# Patient Record
Sex: Male | Born: 1965 | Race: White | Hispanic: No | Marital: Married | Smoking: Never smoker
Health system: Southern US, Community
[De-identification: ages and names within clinical notes are randomized; demographics above are authoritative.]

## PROBLEM LIST (undated history)

## (undated) DIAGNOSIS — I10 Essential (primary) hypertension: Secondary | ICD-10-CM

## (undated) DIAGNOSIS — E119 Type 2 diabetes mellitus without complications: Secondary | ICD-10-CM

## (undated) DIAGNOSIS — E785 Hyperlipidemia, unspecified: Secondary | ICD-10-CM

## (undated) HISTORY — DX: Type 2 diabetes mellitus without complications: E11.9

## (undated) HISTORY — DX: Essential (primary) hypertension: I10

## (undated) HISTORY — PX: PLEURAL SCARIFICATION: SHX748

## (undated) HISTORY — DX: Hyperlipidemia, unspecified: E78.5

---

## 2009-01-15 ENCOUNTER — Ambulatory Visit: Payer: Self-pay | Admitting: Family Medicine

## 2011-05-20 ENCOUNTER — Ambulatory Visit: Payer: Self-pay | Admitting: Emergency Medicine

## 2014-09-02 DIAGNOSIS — E1159 Type 2 diabetes mellitus with other circulatory complications: Secondary | ICD-10-CM | POA: Insufficient documentation

## 2014-09-02 DIAGNOSIS — I10 Essential (primary) hypertension: Secondary | ICD-10-CM | POA: Insufficient documentation

## 2014-09-02 DIAGNOSIS — E785 Hyperlipidemia, unspecified: Secondary | ICD-10-CM | POA: Insufficient documentation

## 2014-09-02 DIAGNOSIS — E669 Obesity, unspecified: Secondary | ICD-10-CM | POA: Insufficient documentation

## 2014-09-02 DIAGNOSIS — E1169 Type 2 diabetes mellitus with other specified complication: Secondary | ICD-10-CM | POA: Insufficient documentation

## 2014-09-02 DIAGNOSIS — I152 Hypertension secondary to endocrine disorders: Secondary | ICD-10-CM | POA: Insufficient documentation

## 2014-09-06 ENCOUNTER — Ambulatory Visit (INDEPENDENT_AMBULATORY_CARE_PROVIDER_SITE_OTHER): Payer: BLUE CROSS/BLUE SHIELD | Admitting: Family Medicine

## 2014-09-06 ENCOUNTER — Encounter: Payer: Self-pay | Admitting: Family Medicine

## 2014-09-06 VITALS — BP 124/81 | HR 69 | Temp 98.3°F | Ht 68.5 in | Wt 227.0 lb

## 2014-09-06 DIAGNOSIS — Z0000 Encounter for general adult medical examination without abnormal findings: Secondary | ICD-10-CM | POA: Diagnosis not present

## 2014-09-06 DIAGNOSIS — Z1211 Encounter for screening for malignant neoplasm of colon: Secondary | ICD-10-CM | POA: Diagnosis not present

## 2014-09-06 DIAGNOSIS — E785 Hyperlipidemia, unspecified: Secondary | ICD-10-CM | POA: Diagnosis not present

## 2014-09-06 DIAGNOSIS — I10 Essential (primary) hypertension: Secondary | ICD-10-CM | POA: Diagnosis not present

## 2014-09-06 DIAGNOSIS — E119 Type 2 diabetes mellitus without complications: Secondary | ICD-10-CM | POA: Diagnosis not present

## 2014-09-06 LAB — MICROALBUMIN, URINE WAIVED
Creatinine, Urine Waived: 100 mg/dL (ref 10–300)
Microalb, Ur Waived: 10 mg/L (ref 0–19)

## 2014-09-06 LAB — URINALYSIS, ROUTINE W REFLEX MICROSCOPIC
Bilirubin, UA: NEGATIVE
Ketones, UA: NEGATIVE
LEUKOCYTES UA: NEGATIVE
NITRITE UA: NEGATIVE
PH UA: 5.5 (ref 5.0–7.5)
Protein, UA: NEGATIVE
RBC, UA: NEGATIVE
Specific Gravity, UA: 1.015 (ref 1.005–1.030)
Urobilinogen, Ur: 0.2 mg/dL (ref 0.2–1.0)

## 2014-09-06 LAB — BAYER DCA HB A1C WAIVED: HB A1C: 7.5 % — AB (ref <7.0)

## 2014-09-06 MED ORDER — SITAGLIPTIN PHOSPHATE 100 MG PO TABS: 100.0000 mg | ORAL_TABLET | Freq: Every day | ORAL | Status: DC

## 2014-09-06 MED ORDER — BENAZEPRIL HCL 40 MG PO TABS: 40.0000 mg | ORAL_TABLET | Freq: Every day | ORAL | Status: DC

## 2014-09-06 MED ORDER — EXENATIDE ER 2 MG ~~LOC~~ PEN: 2.0000 mg | PEN_INJECTOR | SUBCUTANEOUS | Status: DC

## 2014-09-06 MED ORDER — SIMVASTATIN 40 MG PO TABS: 40.0000 mg | ORAL_TABLET | Freq: Every day | ORAL | Status: DC

## 2014-09-06 MED ORDER — DAPAGLIFLOZIN PRO-METFORMIN ER 5-1000 MG PO TB24: 5.0000 | ORAL_TABLET | Freq: Two times a day (BID) | ORAL | Status: DC

## 2014-09-06 NOTE — Assessment & Plan Note (Signed)
The current medical regimen is effective;  continue present plan and medications.  

## 2014-09-06 NOTE — Progress Notes (Signed)
pp  BP 124/81 mmHg  Pulse 69  Temp(Src) 98.3 F (36.8 C)  Ht 5' 8.5" (1.74 m)  Wt 227 lb (102.967 kg)  BMI 34.01 kg/m2  SpO2 97%   Subjective:    Patient ID: Matthew Collier, male    DOB: Oct 18, 1965, 49 y.o.   MRN: [id number]  HPI: Matthew Collier is a 49 y.o. male  Chief Complaint  Patient presents with  . Annual Exam  . Diabetes   Blood pressure doing well with medications. No side effects takes faithfully blood sugar doing well having some insurance hassles and has been out of xigduo Simvastatin doing well with cholesterol no complaints or problems no myalgias  Relevant past medical, surgical, family and social history reviewed and updated as indicated. Interim medical history since our last visit reviewed. Allergies and medications reviewed and updated.  Review of Systems  Constitutional: Negative.   HENT: Negative.   Eyes: Negative.   Respiratory: Negative.   Cardiovascular: Negative.   Endocrine: Negative.   Musculoskeletal: Negative.   Skin: Negative.   Allergic/Immunologic: Negative.   Neurological: Negative.   Hematological: Negative.   Psychiatric/Behavioral: Negative.     Per HPI unless specifically indicated above     Objective:    BP 124/81 mmHg  Pulse 69  Temp(Src) 98.3 F (36.8 C)  Ht 5' 8.5" (1.74 m)  Wt 227 lb (102.967 kg)  BMI 34.01 kg/m2  SpO2 97%  Wt Readings from Last 3 Encounters:  09/06/14 227 lb (102.967 kg)  06/07/14 235 lb (106.595 kg)    Physical Exam  Constitutional: He is oriented to person, place, and time. He appears well-developed and well-nourished.  HENT:  Head: Normocephalic and atraumatic.  Right Ear: External ear normal.  Left Ear: External ear normal.  Eyes: Conjunctivae and EOM are normal. Pupils are equal, round, and reactive to light.  Neck: Normal range of motion. Neck supple.  Cardiovascular: Normal rate, regular rhythm, normal heart sounds and intact distal pulses.   Pulmonary/Chest: Effort normal and breath  sounds normal.  Abdominal: Soft. Bowel sounds are normal. There is no splenomegaly or hepatomegaly.  Genitourinary: Rectum normal, prostate normal and penis normal.  Musculoskeletal: Normal range of motion.  Neurological: He is alert and oriented to person, place, and time. He has normal reflexes.  Skin: No rash noted. No erythema.  Psychiatric: He has a normal mood and affect. His behavior is normal. Judgment and thought content normal.    No results found for this or any previous visit.    Assessment & Plan:   Problem List Items Addressed This Visit      Cardiovascular and Mediastinum   Hypertension    The current medical regimen is effective;  continue present plan and medications.       Relevant Medications   simvastatin (ZOCOR) 40 MG tablet   benazepril (LOTENSIN) 40 MG tablet     Endocrine   Diabetes mellitus without complication - Primary    A1C elevated but has been out of meds Discuss weight loss for better control of diabetes exercise Go to another pharmacy to use coupon Restart all medications       Relevant Medications   sitaGLIPtin (JANUVIA) 100 MG tablet   simvastatin (ZOCOR) 40 MG tablet   Exenatide ER (BYDUREON) 2 MG PEN   Dapagliflozin-Metformin HCl ER (XIGDUO XR) 05-998 MG TB24   benazepril (LOTENSIN) 40 MG tablet   Other Relevant Orders   Microalbumin, Urine Waived   Bayer DCA Hb A1c Waived  Other   Hyperlipidemia    The current medical regimen is effective;  continue present plan and medications.       Relevant Medications   simvastatin (ZOCOR) 40 MG tablet   benazepril (LOTENSIN) 40 MG tablet    Other Visit Diagnoses    Routine general medical examination at a health care facility        Relevant Orders    CBC with Differential/Platelet    Comprehensive metabolic panel    Lipid Panel w/o Chol/HDL Ratio    PSA    TSH    Urinalysis, Routine w reflex microscopic (not at [facility])    Colon cancer screening        Relevant Orders     Ambulatory referral to General Surgery        Follow up plan: Return in about 3 months (around 12/07/2014), or if symptoms worsen or fail to improve, for med check.

## 2014-09-06 NOTE — Assessment & Plan Note (Signed)
A1C elevated but has been out of meds Discuss weight loss for better control of diabetes exercise Go to another pharmacy to use coupon Restart all medications

## 2014-09-07 ENCOUNTER — Encounter: Payer: Self-pay | Admitting: Family Medicine

## 2014-09-07 LAB — CBC WITH DIFFERENTIAL/PLATELET
BASOS: 1 %
Basophils Absolute: 0.1 10*3/uL (ref 0.0–0.2)
EOS (ABSOLUTE): 0.1 10*3/uL (ref 0.0–0.4)
EOS: 2 %
HEMATOCRIT: 48.9 % (ref 37.5–51.0)
HEMOGLOBIN: 17.1 g/dL (ref 12.6–17.7)
IMMATURE GRANS (ABS): 0.1 10*3/uL (ref 0.0–0.1)
IMMATURE GRANULOCYTES: 1 %
Lymphocytes Absolute: 1.5 10*3/uL (ref 0.7–3.1)
Lymphs: 21 %
MCH: 32.3 pg (ref 26.6–33.0)
MCHC: 35 g/dL (ref 31.5–35.7)
MCV: 92 fL (ref 79–97)
MONOCYTES: 9 %
MONOS ABS: 0.7 10*3/uL (ref 0.1–0.9)
NEUTROS PCT: 66 %
Neutrophils Absolute: 4.8 10*3/uL (ref 1.4–7.0)
Platelets: 215 10*3/uL (ref 150–379)
RBC: 5.29 x10E6/uL (ref 4.14–5.80)
RDW: 13.3 % (ref 12.3–15.4)
WBC: 7.1 10*3/uL (ref 3.4–10.8)

## 2014-09-07 LAB — TSH: TSH: 0.61 u[IU]/mL (ref 0.450–4.500)

## 2014-09-07 LAB — COMPREHENSIVE METABOLIC PANEL
A/G RATIO: 1.7 (ref 1.1–2.5)
ALBUMIN: 4.3 g/dL (ref 3.5–5.5)
ALT: 37 IU/L (ref 0–44)
AST: 22 IU/L (ref 0–40)
Alkaline Phosphatase: 63 IU/L (ref 39–117)
BUN/Creatinine Ratio: 14 (ref 9–20)
BUN: 11 mg/dL (ref 6–24)
Bilirubin Total: 0.5 mg/dL (ref 0.0–1.2)
CALCIUM: 9.2 mg/dL (ref 8.7–10.2)
CO2: 21 mmol/L (ref 18–29)
CREATININE: 0.77 mg/dL (ref 0.76–1.27)
Chloride: 101 mmol/L (ref 97–108)
GFR, EST AFRICAN AMERICAN: 121 mL/min/{1.73_m2} (ref >59)
GFR, EST NON AFRICAN AMERICAN: 105 mL/min/{1.73_m2} (ref >59)
GLOBULIN, TOTAL: 2.5 g/dL (ref 1.5–4.5)
Glucose: 168 mg/dL — ABNORMAL HIGH (ref 65–99)
POTASSIUM: 4.7 mmol/L (ref 3.5–5.2)
SODIUM: 141 mmol/L (ref 134–144)
TOTAL PROTEIN: 6.8 g/dL (ref 6.0–8.5)

## 2014-09-07 LAB — LIPID PANEL W/O CHOL/HDL RATIO
Cholesterol, Total: 111 mg/dL (ref 100–199)
HDL: 32 mg/dL — ABNORMAL LOW (ref >39)
LDL CALC: 41 mg/dL (ref 0–99)
Triglycerides: 191 mg/dL — ABNORMAL HIGH (ref 0–149)
VLDL Cholesterol Cal: 38 mg/dL (ref 5–40)

## 2014-09-07 LAB — PSA: PROSTATE SPECIFIC AG, SERUM: 0.8 ng/mL (ref 0.0–4.0)

## 2014-10-12 ENCOUNTER — Telehealth: Payer: Self-pay

## 2014-10-12 ENCOUNTER — Other Ambulatory Visit: Payer: Self-pay | Admitting: Family Medicine

## 2014-10-12 DIAGNOSIS — E119 Type 2 diabetes mellitus without complications: Secondary | ICD-10-CM

## 2014-10-12 MED ORDER — DAPAGLIFLOZIN PRO-METFORMIN ER 5-1000 MG PO TB24: 5.0000 | ORAL_TABLET | Freq: Two times a day (BID) | ORAL | Status: DC

## 2014-10-12 NOTE — Telephone Encounter (Signed)
Brooke from [facility] called in regards to pt's RX for Erlene Quan stated this is usually dosed as one per day, if the prescribed BID is correct can they do 60 tablets to last the entire month. Please call Brooke at [facility] ASAP. Thanks.

## 2014-12-12 ENCOUNTER — Encounter: Payer: Self-pay | Admitting: Family Medicine

## 2014-12-12 ENCOUNTER — Ambulatory Visit (INDEPENDENT_AMBULATORY_CARE_PROVIDER_SITE_OTHER): Payer: BLUE CROSS/BLUE SHIELD | Admitting: Family Medicine

## 2014-12-12 VITALS — BP 127/83 | HR 91 | Temp 98.1°F | Ht 68.5 in | Wt 235.0 lb

## 2014-12-12 DIAGNOSIS — I10 Essential (primary) hypertension: Secondary | ICD-10-CM

## 2014-12-12 DIAGNOSIS — E119 Type 2 diabetes mellitus without complications: Secondary | ICD-10-CM | POA: Diagnosis not present

## 2014-12-12 DIAGNOSIS — E785 Hyperlipidemia, unspecified: Secondary | ICD-10-CM

## 2014-12-12 LAB — BAYER DCA HB A1C WAIVED: HB A1C (BAYER DCA - WAIVED): 7 % — ABNORMAL HIGH (ref <7.0)

## 2014-12-12 NOTE — Assessment & Plan Note (Signed)
The current medical regimen is effective;  continue present plan and medications.  

## 2014-12-12 NOTE — Progress Notes (Signed)
   BP 127/83 mmHg  Pulse 91  Temp(Src) 98.1 F (36.7 C)  Ht 5' 8.5" (1.74 m)  Wt 235 lb (106.595 kg)  BMI 35.21 kg/m2  SpO2 97%   Subjective:    Patient ID: Matthew Collier, male    DOB: [birth year], 49 y.o.   MRN: [id number]  HPI: Matthew Collier is a 49 y.o. male  Chief Complaint  Patient presents with  . Diabetes   Patient all in all doing very well noted low blood sugar spells no complaints from medications Price on medications working as it's supposed to Has not lost weight but it's Christmas season Blood pressure doing well no complaints from medications Cholesterol doing well no complaints from medications Relevant past medical, surgical, family and social history reviewed and updated as indicated. Interim medical history since our last visit reviewed. Allergies and medications reviewed and updated.  Review of Systems  Constitutional: Negative.   Respiratory: Negative.   Cardiovascular: Negative.     Per HPI unless specifically indicated above     Objective:    BP 127/83 mmHg  Pulse 91  Temp(Src) 98.1 F (36.7 C)  Ht 5' 8.5" (1.74 m)  Wt 235 lb (106.595 kg)  BMI 35.21 kg/m2  SpO2 97%  Wt Readings from Last 3 Encounters:  12/12/14 235 lb (106.595 kg)  09/06/14 227 lb (102.967 kg)  06/07/14 235 lb (106.595 kg)    Physical Exam  Constitutional: He is oriented to person, place, and time. He appears well-developed and well-nourished. No distress.  HENT:  Head: Normocephalic and atraumatic.  Right Ear: Hearing normal.  Left Ear: Hearing normal.  Nose: Nose normal.  Eyes: Conjunctivae and lids are normal. Right eye exhibits no discharge. Left eye exhibits no discharge. No scleral icterus.  Cardiovascular: Normal rate, regular rhythm and normal heart sounds.   Pulmonary/Chest: Effort normal and breath sounds normal. No respiratory distress.  Musculoskeletal: Normal range of motion.  Neurological: He is alert and oriented to person, place, and time.  Skin: Skin is  intact. No rash noted.  Psychiatric: He has a normal mood and affect. His speech is normal and behavior is normal. Judgment and thought content normal. Cognition and memory are normal.    Results for orders placed or performed in visit on 12/12/14  Bayer DCA Hb A1c Waived  Result Value Ref Range   Bayer DCA Hb A1c Waived 7.0 (H) <7.0 %      Assessment & Plan:   Problem List Items Addressed This Visit      Cardiovascular and Mediastinum   Hypertension - Primary    The current medical regimen is effective;  continue present plan and medications.         Endocrine   Diabetes mellitus without complication ([place])    The current medical regimen is effective;  continue present plan and medications.         Other   Hyperlipidemia    The current medical regimen is effective;  continue present plan and medications.           Follow up plan: Return in about 3 months (around 03/12/2015) for Med check with labs of BMP, lipid, ALT, AST, A1c.

## 2015-01-23 ENCOUNTER — Ambulatory Visit: Payer: BLUE CROSS/BLUE SHIELD | Admitting: Unknown Physician Specialty

## 2015-02-02 ENCOUNTER — Encounter: Payer: Self-pay | Admitting: Unknown Physician Specialty

## 2015-02-07 ENCOUNTER — Encounter: Payer: Self-pay | Admitting: Emergency Medicine

## 2015-02-07 ENCOUNTER — Ambulatory Visit
Admission: EM | Admit: 2015-02-07 | Discharge: 2015-02-07 | Disposition: A | Discharge status: Home / Self Care | Payer: BLUE CROSS/BLUE SHIELD | Attending: Family Medicine | Admitting: Family Medicine

## 2015-02-07 DIAGNOSIS — J40 Bronchitis, not specified as acute or chronic: Secondary | ICD-10-CM | POA: Diagnosis not present

## 2015-02-07 MED ORDER — AZITHROMYCIN 250 MG PO TABS: ORAL_TABLET | ORAL | Status: DC

## 2015-02-07 MED ORDER — HYDROCOD POLST-CPM POLST ER 10-8 MG/5ML PO SUER: 5.0000 mL | Freq: Two times a day (BID) | ORAL | Status: DC

## 2015-02-07 MED ORDER — FLUTICASONE PROPIONATE 50 MCG/ACT NA SUSP: 2.0000 | Freq: Every day | NASAL | Status: DC

## 2015-02-07 NOTE — ED Provider Notes (Signed)
CSN: [id number]     Arrival date & time 02/07/15  1105 History   First MD Initiated Contact with Patient 02/07/15 1153     Chief Complaint  Patient presents with  . Cough  . Nasal Congestion   (Consider location/radiation/quality/duration/timing/severity/associated sxs/prior Treatment) HPI   50 year old male who presents with a month of coughing and cold symptoms with chest congestion runny nose and yesterday stated that his coughing worsened much more than usual with some green sputum production. He states that he had this since [place] and then seemed to improve for a while but only to return with more of a vengeance. He is febrile today at 100.2 and states he's had chills and fevers on and off throughout this time. O2 sat is 98%. He is a nonsmoker and never did smoke.  Past Medical History  Diagnosis Date  . Diabetes mellitus without complication ([place])   . Hyperlipidemia   . Hypertension    Past Surgical History  Procedure Laterality Date  . Pleural scarification     Family History  Problem Relation Age of Onset  . Hypertension Mother   . Diabetes Mother   . Hypertension Father   . Cancer Father    Social History  Substance Use Topics  . Smoking status: Never Smoker   . Smokeless tobacco: Never Used  . Alcohol Use: Yes     Comment: occasional    Review of Systems  Constitutional: Positive for fever, chills and fatigue.  HENT: Positive for congestion, postnasal drip, rhinorrhea, sinus pressure and sneezing.   Respiratory: Positive for cough. Negative for wheezing and stridor.   Musculoskeletal: Positive for myalgias.  All other systems reviewed and are negative.   Allergies  Penicillins  Home Medications   Prior to Admission medications   Medication Sig Start Date End Date Taking? Authorizing Provider  aspirin EC 81 MG tablet Take 81 mg by mouth daily.    Historical Provider, MD  azithromycin (ZITHROMAX Z-PAK) 250 MG tablet Use as per package instructions  02/07/15   Lorin Picket, PA-C  benazepril (LOTENSIN) 40 MG tablet Take 1 tablet (40 mg total) by mouth daily. 09/06/14   Guadalupe Maple, MD  chlorpheniramine-HYDROcodone (TUSSIONEX PENNKINETIC ER) 10-8 MG/5ML SUER Take 5 mLs by mouth 2 (two) times daily. 02/07/15   Lorin Picket, PA-C  Dapagliflozin-Metformin HCl ER (XIGDUO XR) 05-998 MG TB24 Take 5-1,000 tablets by mouth 2 (two) times daily. 10/12/14   Guadalupe Maple, MD  Exenatide ER (BYDUREON) 2 MG PEN Inject 2 mg into the skin once a week. 09/06/14   Guadalupe Maple, MD  fluticasone (FLONASE) 50 MCG/ACT nasal spray Place 2 sprays into both nostrils daily. 02/07/15   Lorin Picket, PA-C  simvastatin (ZOCOR) 40 MG tablet Take 1 tablet (40 mg total) by mouth daily. 09/06/14   Guadalupe Maple, MD  sitaGLIPtin (JANUVIA) 100 MG tablet Take 1 tablet (100 mg total) by mouth daily. 09/06/14   Guadalupe Maple, MD   Meds Ordered and Administered this Visit  Medications - No data to display  BP 159/92 mmHg  Pulse 98  Temp(Src) 100.2 F (37.9 C) (Oral)  Resp 17  Ht 5\' 10"  (1.778 m)  Wt 235 lb (106.595 kg)  BMI 33.72 kg/m2  SpO2 98% No data found.   Physical Exam  Constitutional: He is oriented to person, place, and time. He appears well-developed and well-nourished. No distress.  HENT:  Head: Normocephalic and atraumatic.  Right Ear: External ear  normal.  Left Ear: External ear normal.  Nose: Nose normal.  Mouth/Throat: Oropharynx is clear and moist. No oropharyngeal exudate.  Eyes: Conjunctivae are normal. Pupils are equal, round, and reactive to light.  Neck: Normal range of motion. Neck supple.  Pulmonary/Chest: Breath sounds normal. No respiratory distress. He has no wheezes. He has no rales.  Musculoskeletal: Normal range of motion. He exhibits no edema or tenderness.  Lymphadenopathy:    He has no cervical adenopathy.  Neurological: He is alert and oriented to person, place, and time.  Skin: Skin is warm and dry. He is not  diaphoretic.  Psychiatric: He has a normal mood and affect. His behavior is normal. Judgment and thought content normal.  Nursing note and vitals reviewed.   ED Course  Procedures (including critical care time)  Labs Review Labs Reviewed - No data to display  Imaging Review No results found.   Visual Acuity Review  Right Eye Distance:   Left Eye Distance:   Bilateral Distance:    Right Eye Near:   Left Eye Near:    Bilateral Near:         MDM   1. Bronchitis    Discharge Medication List as of 02/07/2015 12:08 PM    START taking these medications   Details  azithromycin (ZITHROMAX Z-PAK) 250 MG tablet Use as per package instructions, Normal    chlorpheniramine-HYDROcodone (TUSSIONEX PENNKINETIC ER) 10-8 MG/5ML SUER Take 5 mLs by mouth 2 (two) times daily., Starting 02/07/2015, Until Discontinued, Print    fluticasone (FLONASE) 50 MCG/ACT nasal spray Place 2 sprays into both nostrils daily., Starting 02/07/2015, Until Discontinued, Normal      Plan: 1. Diagnosis reviewed with patient 2. rx as per orders; risks, benefits, potential side effects reviewed with patient 3. Recommend supportive treatment with fluids and rest. Sees had this symptoms for over a month they have been no recurrent I will start him on a short course of antibiotics divide antitussives recommended the use of Flonase on a daily basis for a month. He still is not improving I recommended that he seen by his primary care physician for ongoing care. 4. F/u prn if symptoms worsen or don't improve      Lorin Picket, PA-C 02/07/15 2121

## 2015-02-07 NOTE — ED Notes (Signed)
Patient c/o cough and chest congestion, and runny nose for the past 2 weeks.

## 2015-02-07 NOTE — Discharge Instructions (Signed)
Upper Respiratory Infection, Adult Most upper respiratory infections (URIs) are a viral infection of the air passages leading to the lungs. A URI affects the nose, throat, and upper air passages. The most common type of URI is nasopharyngitis and is typically referred to as "the common cold." URIs run their course and usually go away on their own. Most of the time, a URI does not require medical attention, but sometimes a bacterial infection in the upper airways can follow a viral infection. This is called a secondary infection. Sinus and middle ear infections are common types of secondary upper respiratory infections. Bacterial pneumonia can also complicate a URI. A URI can worsen asthma and chronic obstructive pulmonary disease (COPD). Sometimes, these complications can require emergency medical care and may be life threatening.  CAUSES Almost all URIs are caused by viruses. A virus is a type of germ and can spread from one person to another.  RISKS FACTORS You may be at risk for a URI if:   You smoke.   You have chronic heart or lung disease.  You have a weakened defense (immune) system.   You are very young or very old.   You have nasal allergies or asthma.  You work in crowded or poorly ventilated areas.  You work in health care facilities or schools. SIGNS AND SYMPTOMS  Symptoms typically develop 2-3 days after you come in contact with a cold virus. Most viral URIs last 7-10 days. However, viral URIs from the influenza virus (flu virus) can last 14-18 days and are typically more severe. Symptoms may include:   Runny or stuffy (congested) nose.   Sneezing.   Cough.   Sore throat.   Headache.   Fatigue.   Fever.   Loss of appetite.   Pain in your forehead, behind your eyes, and over your cheekbones (sinus pain).  Muscle aches.  DIAGNOSIS  Your health care provider may diagnose a URI by:  Physical exam.  Tests to check that your symptoms are not due to  another condition such as:  Strep throat.  Sinusitis.  Pneumonia.  Asthma. TREATMENT  A URI goes away on its own with time. It cannot be cured with medicines, but medicines may be prescribed or recommended to relieve symptoms. Medicines may help:  Reduce your fever.  Reduce your cough.  Relieve nasal congestion. HOME CARE INSTRUCTIONS   Take medicines only as directed by your health care provider.   Gargle warm saltwater or take cough drops to comfort your throat as directed by your health care provider.  Use a warm mist humidifier or inhale steam from a shower to increase air moisture. This may make it easier to breathe.  Drink enough fluid to keep your urine clear or pale yellow.   Eat soups and other clear broths and maintain good nutrition.   Rest as needed.   Return to work when your temperature has returned to normal or as your health care provider advises. You may need to stay home longer to avoid infecting others. You can also use a face mask and careful hand washing to prevent spread of the virus.  Increase the usage of your inhaler if you have asthma.   Do not use any tobacco products, including cigarettes, chewing tobacco, or electronic cigarettes. If you need help quitting, ask your health care provider. PREVENTION  The best way to protect yourself from getting a cold is to practice good hygiene.   Avoid oral or hand contact with people with cold   symptoms.   Wash your hands often if contact occurs.  There is no clear evidence that vitamin C, vitamin E, echinacea, or exercise reduces the chance of developing a cold. However, it is always recommended to get plenty of rest, exercise, and practice good nutrition.  SEEK MEDICAL CARE IF:   You are getting worse rather than better.   Your symptoms are not controlled by medicine.   You have chills.  You have worsening shortness of breath.  You have brown or red mucus.  You have yellow or brown nasal  discharge.  You have pain in your face, especially when you bend forward.  You have a fever.  You have swollen neck glands.  You have pain while swallowing.  You have white areas in the back of your throat. SEEK IMMEDIATE MEDICAL CARE IF:   You have severe or persistent:  Headache.  Ear pain.  Sinus pain.  Chest pain.  You have chronic lung disease and any of the following:  Wheezing.  Prolonged cough.  Coughing up blood.  A change in your usual mucus.  You have a stiff neck.  You have changes in your:  Vision.  Hearing.  Thinking.  Mood. MAKE SURE YOU:   Understand these instructions.  Will watch your condition.  Will get help right away if you are not doing well or get worse.   This information is not intended to replace advice given to you by your health care provider. Make sure you discuss any questions you have with your health care provider.   Document Released: 06/19/2000 Document Revised: 05/10/2014 Document Reviewed: 03/31/2013 Elsevier Interactive Patient Education 2016 [employer] Inc[employer]  

## 2015-03-13 ENCOUNTER — Ambulatory Visit: Payer: BLUE CROSS/BLUE SHIELD | Admitting: Family Medicine

## 2015-03-14 ENCOUNTER — Encounter: Payer: Self-pay | Admitting: Family Medicine

## 2015-04-24 ENCOUNTER — Encounter: Payer: Self-pay | Admitting: Family Medicine

## 2015-04-24 ENCOUNTER — Ambulatory Visit (INDEPENDENT_AMBULATORY_CARE_PROVIDER_SITE_OTHER): Payer: BLUE CROSS/BLUE SHIELD | Admitting: Family Medicine

## 2015-04-24 VITALS — BP 143/86 | HR 76 | Temp 98.7°F | Ht 68.0 in | Wt 230.0 lb

## 2015-04-24 DIAGNOSIS — I10 Essential (primary) hypertension: Secondary | ICD-10-CM

## 2015-04-24 DIAGNOSIS — E119 Type 2 diabetes mellitus without complications: Secondary | ICD-10-CM | POA: Diagnosis not present

## 2015-04-24 DIAGNOSIS — E785 Hyperlipidemia, unspecified: Secondary | ICD-10-CM

## 2015-04-24 NOTE — Progress Notes (Signed)
   BP 143/86 mmHg  Pulse 76  Temp(Src) 98.7 F (37.1 C)  Ht 5\' 8"  (1.727 m)  Wt 230 lb (104.327 kg)  BMI 34.98 kg/m2  SpO2 97%   Subjective:    Patient ID: Matthew Collier, male    DOB: 1965-12-16, 50 y.o.   MRN: [id number]  HPI: Matthew Collier is a 51 y.o. male  Chief Complaint  Patient presents with  . Diabetes  . Hyperlipidemia  . Hypertension   Patient all in all doing badly with elevated blood pressure on testing today unable to measure triglycerides and cholesterol is too high here in the office and hemoglobin A1c of 8.8. Patient readily admits his been poor compliance with medication and eating a lot of candy. Abdomen polyuria polydipsia Vision is noticeably gotten worse  Relevant past medical, surgical, family and social history reviewed and updated as indicated. Interim medical history since our last visit reviewed. Allergies and medications reviewed and updated.  Review of Systems  Constitutional: Positive for fatigue.  Respiratory: Negative.   Cardiovascular: Negative.     Per HPI unless specifically indicated above     Objective:    BP 143/86 mmHg  Pulse 76  Temp(Src) 98.7 F (37.1 C)  Ht 5\' 8"  (1.727 m)  Wt 230 lb (104.327 kg)  BMI 34.98 kg/m2  SpO2 97%  Wt Readings from Last 3 Encounters:  04/24/15 230 lb (104.327 kg)  02/07/15 235 lb (106.595 kg)  12/12/14 235 lb (106.595 kg)    Physical Exam  Constitutional: He is oriented to person, place, and time. He appears well-developed and well-nourished. No distress.  HENT:  Head: Normocephalic and atraumatic.  Right Ear: Hearing normal.  Left Ear: Hearing normal.  Nose: Nose normal.  Eyes: Conjunctivae and lids are normal. Right eye exhibits no discharge. Left eye exhibits no discharge. No scleral icterus.  Cardiovascular: Normal rate, regular rhythm and normal heart sounds.   Pulmonary/Chest: Effort normal and breath sounds normal. No respiratory distress.  Musculoskeletal: Normal range of motion.   Neurological: He is alert and oriented to person, place, and time.  Skin: Skin is intact. No rash noted.  Psychiatric: He has a normal mood and affect. His speech is normal and behavior is normal. Judgment and thought content normal. Cognition and memory are normal.    Results for orders placed or performed in visit on 12/12/14  Bayer DCA Hb A1c Waived  Result Value Ref Range   Bayer DCA Hb A1c Waived 7.0 (H) <7.0 %      Assessment & Plan:   Problem List Items Addressed This Visit      Cardiovascular and Mediastinum   Hypertension    Poor control due to poor compliance and diet patient will be doing better      Relevant Orders   Basic metabolic panel     Endocrine   Diabetes mellitus without complication ([place]) - Primary    Poor control due to poor compliance and diet patient will be doing better      Relevant Orders   Bayer DCA Hb A1c Waived     Other   Hyperlipidemia    Poor control due to poor compliance and diet patient will be doing better      Relevant Orders   LP+ALT+AST [place], Waived       Follow up plan: Return in about 3 months (around 07/24/2015) for Physical Exam a1c.

## 2015-04-24 NOTE — Assessment & Plan Note (Signed)
Poor control due to poor compliance and diet patient will be doing better

## 2015-04-25 ENCOUNTER — Encounter: Payer: Self-pay | Admitting: Family Medicine

## 2015-04-25 LAB — BASIC METABOLIC PANEL
BUN/Creatinine Ratio: 20 (ref 9–20)
BUN: 17 mg/dL (ref 6–24)
CALCIUM: 9.9 mg/dL (ref 8.7–10.2)
CHLORIDE: 101 mmol/L (ref 96–106)
CO2: 17 mmol/L — AB (ref 18–29)
Creatinine, Ser: 0.86 mg/dL (ref 0.76–1.27)
GFR calc Af Amer: 115 mL/min/{1.73_m2} (ref >59)
GFR, EST NON AFRICAN AMERICAN: 100 mL/min/{1.73_m2} (ref >59)
GLUCOSE: 198 mg/dL — AB (ref 65–99)
POTASSIUM: 4.2 mmol/L (ref 3.5–5.2)
SODIUM: 140 mmol/L (ref 134–144)

## 2015-04-26 LAB — LIPID PANEL W/O CHOL/HDL RATIO
Cholesterol, Total: 231 mg/dL — ABNORMAL HIGH (ref 100–199)
HDL: 34 mg/dL — ABNORMAL LOW (ref >39)
Triglycerides: 639 mg/dL (ref 0–149)

## 2015-04-26 LAB — SPECIMEN STATUS REPORT

## 2015-04-27 ENCOUNTER — Telehealth: Payer: Self-pay | Admitting: Family Medicine

## 2015-04-27 LAB — LP+ALT+AST PICCOLO, WAIVED
ALT (SGPT) PICCOLO, WAIVED: 38 U/L (ref 10–47)
AST (SGOT) Piccolo, Waived: 28 U/L (ref 11–38)

## 2015-04-27 LAB — BAYER DCA HB A1C WAIVED: HB A1C (BAYER DCA - WAIVED): 8.8 % — ABNORMAL HIGH (ref <7.0)

## 2015-04-27 NOTE — Telephone Encounter (Signed)
Phone call Discussed with patient triglycerides terribly high reviewed danger of pancreatitis with not taking medications and poor control of diabetes the patient is doing. Discussed diet nutrition exercise Patient's taking medications faithfully now doing better. Will recheck again at next office visit.

## 2015-05-02 ENCOUNTER — Other Ambulatory Visit: Payer: Self-pay | Admitting: Family Medicine

## 2015-05-03 ENCOUNTER — Other Ambulatory Visit: Payer: Self-pay | Admitting: Family Medicine

## 2015-08-03 ENCOUNTER — Encounter: Payer: Self-pay | Admitting: Family Medicine

## 2015-08-03 ENCOUNTER — Ambulatory Visit (INDEPENDENT_AMBULATORY_CARE_PROVIDER_SITE_OTHER): Payer: BLUE CROSS/BLUE SHIELD | Admitting: Family Medicine

## 2015-08-03 VITALS — BP 119/79 | HR 84 | Temp 98.1°F | Ht 68.2 in | Wt 224.0 lb

## 2015-08-03 DIAGNOSIS — E785 Hyperlipidemia, unspecified: Secondary | ICD-10-CM

## 2015-08-03 DIAGNOSIS — E119 Type 2 diabetes mellitus without complications: Secondary | ICD-10-CM | POA: Diagnosis not present

## 2015-08-03 DIAGNOSIS — I10 Essential (primary) hypertension: Secondary | ICD-10-CM | POA: Diagnosis not present

## 2015-08-03 NOTE — Assessment & Plan Note (Signed)
The current medical regimen is effective;  continue present plan and medications.  

## 2015-08-03 NOTE — Progress Notes (Signed)
BP 119/79 (BP Location: Left Arm, Patient Position: Sitting, Cuff Size: Normal)   Pulse 84   Temp 98.1 F (36.7 C)   Ht 5' 8.2" (1.732 m)   Wt 224 lb (101.6 kg)   SpO2 97%   BMI 33.86 kg/m    Subjective:    Patient ID: Matthew Collier, male    DOB: September 10, 1965, 50 y.o.   MRN: [id number]  HPI: Matthew Collier is a 50 y.o. male  Chief Complaint  Patient presents with  . Diabetes  Patient doing well noted low blood sugar spells has lost 9 pounds over this year taking medications without problems Taking cholesterol medicines again without problems home glucose checking mostly in the mid to low 100s  Relevant past medical, surgical, family and social history reviewed and updated as indicated. Interim medical history since our last visit reviewed. Allergies and medications reviewed and updated.  Review of Systems  Constitutional: Negative.   Respiratory: Negative.   Cardiovascular: Negative.     Per HPI unless specifically indicated above     Objective:    BP 119/79 (BP Location: Left Arm, Patient Position: Sitting, Cuff Size: Normal)   Pulse 84   Temp 98.1 F (36.7 C)   Ht 5' 8.2" (1.732 m)   Wt 224 lb (101.6 kg)   SpO2 97%   BMI 33.86 kg/m   Wt Readings from Last 3 Encounters:  08/03/15 224 lb (101.6 kg)  04/24/15 230 lb (104.3 kg)  02/07/15 235 lb (106.6 kg)    Physical Exam  Constitutional: He is oriented to person, place, and time. He appears well-developed and well-nourished. No distress.  HENT:  Head: Normocephalic and atraumatic.  Right Ear: Hearing normal.  Left Ear: Hearing normal.  Nose: Nose normal.  Eyes: Conjunctivae and lids are normal. Right eye exhibits no discharge. Left eye exhibits no discharge. No scleral icterus.  Cardiovascular: Normal rate, regular rhythm and normal heart sounds.   Pulmonary/Chest: Effort normal and breath sounds normal. No respiratory distress.  Musculoskeletal: Normal range of motion.  Neurological: He is alert and oriented  to person, place, and time.  Skin: Skin is intact. No rash noted.  Psychiatric: He has a normal mood and affect. His speech is normal and behavior is normal. Judgment and thought content normal. Cognition and memory are normal.    Results for orders placed or performed in visit on 04/24/15  Bayer DCA Hb A1c Waived  Result Value Ref Range   Bayer DCA Hb A1c Waived 8.8 (H) <7.0 %  LP+ALT+AST Piccolo, Waived  Result Value Ref Range   ALT (SGPT) Piccolo, Waived 38 10 - 47 U/L   AST (SGOT) Piccolo, Waived 28 11 - 38 U/L   Cholesterol Piccolo, Waived CANCELED    HDL Chol Piccolo, Waived CANCELED    Triglycerides Piccolo,Waived CANCELED   Basic metabolic panel  Result Value Ref Range   Glucose 198 (H) 65 - 99 mg/dL   BUN 17 6 - 24 mg/dL   Creatinine, Ser 0.86 0.76 - 1.27 mg/dL   GFR calc non Af Amer 100 >59 mL/min/1.73   GFR calc Af Amer 115 >59 mL/min/1.73   BUN/Creatinine Ratio 20 9 - 20   Sodium 140 134 - 144 mmol/L   Potassium 4.2 3.5 - 5.2 mmol/L   Chloride 101 96 - 106 mmol/L   CO2 17 (L) 18 - 29 mmol/L   Calcium 9.9 8.7 - 10.2 mg/dL  Lipid Panel w/o Chol/HDL Ratio  Result Value Ref Range  Cholesterol, Total 231 (H) 100 - 199 mg/dL   Triglycerides 639 (HH) 0 - 149 mg/dL   HDL 34 (L) >39 mg/dL   VLDL Cholesterol Cal Comment 5 - 40 mg/dL   LDL Calculated Comment 0 - 99 mg/dL  Specimen status report  Result Value Ref Range   specimen status report Comment       Assessment & Plan:   Problem List Items Addressed This Visit      Cardiovascular and Mediastinum   Hypertension    The current medical regimen is effective;  continue present plan and medications.         Endocrine   Diabetes mellitus without complication ([place]) - Primary    Heart improvement with weight loss and medication hemoglobin A1c down from 8.8-7.0 patient will continue good care      Relevant Orders   Bayer DCA Hb A1c Waived     Other   Hyperlipidemia    The current medical regimen is  effective;  continue present plan and medications.        Other Visit Diagnoses   None.      Follow up plan: Return for Physical Exam.

## 2015-08-03 NOTE — Assessment & Plan Note (Signed)
Heart improvement with weight loss and medication hemoglobin A1c down from 8.8-7.0 patient will continue good care

## 2015-08-04 LAB — BAYER DCA HB A1C WAIVED: HB A1C (BAYER DCA - WAIVED): 7 % — ABNORMAL HIGH (ref <7.0)

## 2015-08-29 ENCOUNTER — Encounter (INDEPENDENT_AMBULATORY_CARE_PROVIDER_SITE_OTHER): Payer: Self-pay

## 2015-09-26 ENCOUNTER — Encounter: Payer: BLUE CROSS/BLUE SHIELD | Admitting: Family Medicine

## 2015-09-26 ENCOUNTER — Encounter (INDEPENDENT_AMBULATORY_CARE_PROVIDER_SITE_OTHER): Payer: Self-pay

## 2015-10-16 ENCOUNTER — Other Ambulatory Visit: Payer: Self-pay | Admitting: Family Medicine

## 2015-10-16 DIAGNOSIS — E119 Type 2 diabetes mellitus without complications: Secondary | ICD-10-CM

## 2015-10-23 ENCOUNTER — Encounter: Payer: Self-pay | Admitting: Family Medicine

## 2015-10-23 ENCOUNTER — Ambulatory Visit (INDEPENDENT_AMBULATORY_CARE_PROVIDER_SITE_OTHER): Payer: BLUE CROSS/BLUE SHIELD | Admitting: Family Medicine

## 2015-10-23 VITALS — BP 123/78 | HR 80 | Temp 98.0°F | Ht 68.2 in | Wt 230.2 lb

## 2015-10-23 DIAGNOSIS — E785 Hyperlipidemia, unspecified: Secondary | ICD-10-CM | POA: Diagnosis not present

## 2015-10-23 DIAGNOSIS — E7800 Pure hypercholesterolemia, unspecified: Secondary | ICD-10-CM

## 2015-10-23 DIAGNOSIS — I10 Essential (primary) hypertension: Secondary | ICD-10-CM | POA: Diagnosis not present

## 2015-10-23 DIAGNOSIS — E119 Type 2 diabetes mellitus without complications: Secondary | ICD-10-CM

## 2015-10-23 DIAGNOSIS — Z23 Encounter for immunization: Secondary | ICD-10-CM | POA: Diagnosis not present

## 2015-10-23 DIAGNOSIS — Z0000 Encounter for general adult medical examination without abnormal findings: Secondary | ICD-10-CM | POA: Diagnosis not present

## 2015-10-23 LAB — URINALYSIS, ROUTINE W REFLEX MICROSCOPIC
BILIRUBIN UA: NEGATIVE
Ketones, UA: NEGATIVE
Leukocytes, UA: NEGATIVE
NITRITE UA: NEGATIVE
PH UA: 5 (ref 5.0–7.5)
PROTEIN UA: NEGATIVE
RBC UA: NEGATIVE
Specific Gravity, UA: <1.005 — ABNORMAL LOW (ref 1.005–1.030)
UUROB: 0.2 mg/dL (ref 0.2–1.0)

## 2015-10-23 LAB — MICROSCOPIC EXAMINATION
BACTERIA UA: NONE SEEN
Epithelial Cells (non renal): NONE SEEN /hpf (ref 0–10)
RBC, UA: NONE SEEN /hpf (ref 0–?)

## 2015-10-23 LAB — BAYER DCA HB A1C WAIVED: HB A1C (BAYER DCA - WAIVED): 7.4 % — ABNORMAL HIGH (ref <7.0)

## 2015-10-23 LAB — MICROALBUMIN, URINE WAIVED
CREATININE, URINE WAIVED: 50 mg/dL (ref 10–300)
MICROALB, UR WAIVED: 10 mg/L (ref 0–19)

## 2015-10-23 MED ORDER — SITAGLIPTIN PHOSPHATE 100 MG PO TABS: 100.0000 mg | ORAL_TABLET | Freq: Every day | ORAL | 12 refills | Status: DC

## 2015-10-23 MED ORDER — BENAZEPRIL HCL 40 MG PO TABS: 40.0000 mg | ORAL_TABLET | Freq: Every day | ORAL | 12 refills | Status: DC

## 2015-10-23 MED ORDER — GLIPIZIDE 5 MG PO TABS: 5.0000 mg | ORAL_TABLET | Freq: Every day | ORAL | 1 refills | Status: DC

## 2015-10-23 MED ORDER — EXENATIDE ER 2 MG ~~LOC~~ PEN: PEN_INJECTOR | SUBCUTANEOUS | 12 refills | Status: DC

## 2015-10-23 MED ORDER — SIMVASTATIN 40 MG PO TABS: 40.0000 mg | ORAL_TABLET | Freq: Every day | ORAL | 12 refills | Status: DC

## 2015-10-23 MED ORDER — DAPAGLIFLOZIN PRO-METFORMIN ER 5-1000 MG PO TB24: 1.0000 | ORAL_TABLET | Freq: Two times a day (BID) | ORAL | 12 refills | Status: DC

## 2015-10-23 NOTE — Assessment & Plan Note (Signed)
Diabetes poor control with possible gastroparesis symptoms discussed importance for better control will add glipizide 5 mg once each morning. Discussed diet exercise nutrition importance of weight loss. Patient will check blood sugar from time to time and if not coming down we'll notify us. Patient understands insulin as the next step

## 2015-10-23 NOTE — Assessment & Plan Note (Signed)
The current medical regimen is effective;  continue present plan and medications.  

## 2015-10-23 NOTE — Progress Notes (Signed)
BP 123/78 (BP Location: Left Arm, Patient Position: Sitting, Cuff Size: Normal)   Pulse 80   Temp 98 F (36.7 C)   Ht 5' 8.2" (1.732 m)   Wt 230 lb 3.2 oz (104.4 kg)   SpO2 97%   BMI 34.80 kg/m    Subjective:    Patient ID: Matthew Collier, male    DOB: 08/31/1965, 50 y.o.   MRN: [id number]  HPI: Matthew Collier is a 50 y.o. male  Chief Complaint  Patient presents with  . Annual Exam  Patient follow-up physical exam doing well no complaints. Taking Benzapril for blood pressure with good blood pressure control no issues. Diabetes doing well good control no low blood sugar spells aching medications without side effects and faithfully. Cholesterol taking simvastatin without problems no complaints and faithfully.   Relevant past medical, surgical, family and social history reviewed and updated as indicated. Interim medical history since our last visit reviewed. Allergies and medications reviewed and updated.  Review of Systems  Constitutional: Negative.   HENT: Negative.   Eyes: Negative.   Respiratory: Negative.   Cardiovascular: Negative.   Gastrointestinal: Negative.        Patient with some occasional bloating type sensation sometimes can go weeks without any symptoms feels like he is overeaten and then things start moving and will have a large bowel movement afterwards. No blood in stool or urine  Endocrine: Negative.   Genitourinary: Negative.   Musculoskeletal: Negative.   Skin: Negative.   Allergic/Immunologic: Negative.   Neurological: Negative.   Hematological: Negative.   Psychiatric/Behavioral: Negative.     Per HPI unless specifically indicated above     Objective:    BP 123/78 (BP Location: Left Arm, Patient Position: Sitting, Cuff Size: Normal)   Pulse 80   Temp 98 F (36.7 C)   Ht 5' 8.2" (1.732 m)   Wt 230 lb 3.2 oz (104.4 kg)   SpO2 97%   BMI 34.80 kg/m   Wt Readings from Last 3 Encounters:  10/23/15 230 lb 3.2 oz (104.4 kg)  08/03/15 224 lb  (101.6 kg)  04/24/15 230 lb (104.3 kg)    Physical Exam  Constitutional: He is oriented to person, place, and time. He appears well-developed and well-nourished.  HENT:  Head: Normocephalic and atraumatic.  Right Ear: External ear normal.  Left Ear: External ear normal.  Eyes: Conjunctivae and EOM are normal. Pupils are equal, round, and reactive to light.  Neck: Normal range of motion. Neck supple.  Cardiovascular: Normal rate, regular rhythm, normal heart sounds and intact distal pulses.   Pulmonary/Chest: Effort normal and breath sounds normal.  Abdominal: Soft. Bowel sounds are normal. There is no splenomegaly or hepatomegaly.  Genitourinary: Rectum normal, prostate normal and penis normal.  Musculoskeletal: Normal range of motion.  Neurological: He is alert and oriented to person, place, and time. He has normal reflexes.  Skin: No rash noted. No erythema.  Psychiatric: He has a normal mood and affect. His behavior is normal. Judgment and thought content normal.    Results for orders placed or performed in visit on 08/03/15  Bayer DCA Hb A1c Waived  Result Value Ref Range   Bayer DCA Hb A1c Waived 7.0 (H) <7.0 %      Assessment & Plan:   Problem List Items Addressed This Visit      Cardiovascular and Mediastinum   Hypertension    The current medical regimen is effective;  continue present plan and medications.  Relevant Medications   simvastatin (ZOCOR) 40 MG tablet   benazepril (LOTENSIN) 40 MG tablet   Other Relevant Orders   Comprehensive metabolic panel     Endocrine   Diabetes mellitus without complication ([place]) - Primary    Diabetes poor control with possible gastroparesis symptoms discussed importance for better control will add glipizide 5 mg once each morning. Discussed diet exercise nutrition importance of weight loss. Patient will check blood sugar from time to time and if not coming down we'll notify us. Patient understands insulin as the next step        Relevant Medications   Dapagliflozin-Metformin HCl ER (XIGDUO XR) 05-998 MG TB24   simvastatin (ZOCOR) 40 MG tablet   sitaGLIPtin (JANUVIA) 100 MG tablet   Exenatide ER (BYDUREON) 2 MG PEN   benazepril (LOTENSIN) 40 MG tablet   glipiZIDE (GLUCOTROL) 5 MG tablet   Other Relevant Orders   Bayer DCA Hb A1c Waived   Urinalysis, Routine w reflex microscopic (not at [facility])   Microalbumin, Urine Waived     Other   Hyperlipidemia    The current medical regimen is effective;  continue present plan and medications.       Relevant Medications   simvastatin (ZOCOR) 40 MG tablet   benazepril (LOTENSIN) 40 MG tablet   Other Relevant Orders   Lipid panel    Other Visit Diagnoses    Annual physical exam       Relevant Orders   Bayer DCA Hb A1c Waived   CBC with Differential/Platelet   Comprehensive metabolic panel   Lipid panel   PSA   TSH   Urinalysis, Routine w reflex microscopic (not at [facility])   Microalbumin, Urine Waived   Need for influenza vaccination       Relevant Orders   Flu Vaccine QUAD 36+ mos PF IM (Fluarix & Fluzone Quad PF) (Completed)       Follow up plan: Return in about 3 months (around 01/23/2016) for Hemoglobin A1c.

## 2015-10-24 ENCOUNTER — Encounter: Payer: Self-pay | Admitting: Family Medicine

## 2015-10-24 LAB — PSA: Prostate Specific Ag, Serum: 1 ng/mL (ref 0.0–4.0)

## 2015-10-24 LAB — CBC WITH DIFFERENTIAL/PLATELET
BASOS ABS: 0.1 10*3/uL (ref 0.0–0.2)
Basos: 1 %
EOS (ABSOLUTE): 0.1 10*3/uL (ref 0.0–0.4)
Eos: 2 %
Hematocrit: 50.8 % (ref 37.5–51.0)
Hemoglobin: 17.3 g/dL (ref 12.6–17.7)
IMMATURE GRANULOCYTES: 1 %
Immature Grans (Abs): 0 10*3/uL (ref 0.0–0.1)
Lymphocytes Absolute: 1.3 10*3/uL (ref 0.7–3.1)
Lymphs: 21 %
MCH: 31.6 pg (ref 26.6–33.0)
MCHC: 34.1 g/dL (ref 31.5–35.7)
MCV: 93 fL (ref 79–97)
MONOS ABS: 0.4 10*3/uL (ref 0.1–0.9)
Monocytes: 6 %
NEUTROS PCT: 69 %
Neutrophils Absolute: 4.3 10*3/uL (ref 1.4–7.0)
PLATELETS: 204 10*3/uL (ref 150–379)
RBC: 5.47 x10E6/uL (ref 4.14–5.80)
RDW: 13 % (ref 12.3–15.4)
WBC: 6.1 10*3/uL (ref 3.4–10.8)

## 2015-10-24 LAB — COMPREHENSIVE METABOLIC PANEL
A/G RATIO: 1.6 (ref 1.2–2.2)
ALT: 33 IU/L (ref 0–44)
AST: 17 IU/L (ref 0–40)
Albumin: 4.6 g/dL (ref 3.5–5.5)
Alkaline Phosphatase: 56 IU/L (ref 39–117)
BUN/Creatinine Ratio: 17 (ref 9–20)
BUN: 14 mg/dL (ref 6–24)
Bilirubin Total: 0.5 mg/dL (ref 0.0–1.2)
CALCIUM: 9.7 mg/dL (ref 8.7–10.2)
CO2: 21 mmol/L (ref 18–29)
CREATININE: 0.83 mg/dL (ref 0.76–1.27)
Chloride: 101 mmol/L (ref 96–106)
GFR calc Af Amer: 117 mL/min/{1.73_m2} (ref >59)
GFR, EST NON AFRICAN AMERICAN: 101 mL/min/{1.73_m2} (ref >59)
Globulin, Total: 2.8 g/dL (ref 1.5–4.5)
Glucose: 177 mg/dL — ABNORMAL HIGH (ref 65–99)
POTASSIUM: 4.8 mmol/L (ref 3.5–5.2)
Sodium: 141 mmol/L (ref 134–144)
Total Protein: 7.4 g/dL (ref 6.0–8.5)

## 2015-10-24 LAB — LIPID PANEL
CHOL/HDL RATIO: 3.9 ratio (ref 0.0–5.0)
Cholesterol, Total: 154 mg/dL (ref 100–199)
HDL: 39 mg/dL — AB (ref >39)
LDL Calculated: 73 mg/dL (ref 0–99)
TRIGLYCERIDES: 208 mg/dL — AB (ref 0–149)
VLDL Cholesterol Cal: 42 mg/dL — ABNORMAL HIGH (ref 5–40)

## 2015-10-24 LAB — TSH: TSH: 0.622 u[IU]/mL (ref 0.450–4.500)

## 2016-01-23 ENCOUNTER — Ambulatory Visit (INDEPENDENT_AMBULATORY_CARE_PROVIDER_SITE_OTHER): Payer: BLUE CROSS/BLUE SHIELD | Admitting: Family Medicine

## 2016-01-23 ENCOUNTER — Encounter: Payer: Self-pay | Admitting: Family Medicine

## 2016-01-23 VITALS — BP 107/95 | HR 101 | Temp 98.2°F | Ht 68.2 in | Wt 243.0 lb

## 2016-01-23 DIAGNOSIS — I10 Essential (primary) hypertension: Secondary | ICD-10-CM | POA: Diagnosis not present

## 2016-01-23 DIAGNOSIS — E785 Hyperlipidemia, unspecified: Secondary | ICD-10-CM | POA: Diagnosis not present

## 2016-01-23 DIAGNOSIS — E119 Type 2 diabetes mellitus without complications: Secondary | ICD-10-CM | POA: Diagnosis not present

## 2016-01-23 MED ORDER — GLIPIZIDE 5 MG PO TABS: 5.0000 mg | ORAL_TABLET | Freq: Every day | ORAL | 1 refills | Status: DC

## 2016-01-23 NOTE — Assessment & Plan Note (Addendum)
The current medical regimen is effective;  continue present plan and medications. Reviewed A1c of 7 which is doing okay but patient and we want to do better patient will get back on diet exercise will leave medications align hopefully will run into problems of overmedicated and need to reduce medicines at some point.

## 2016-01-23 NOTE — Assessment & Plan Note (Signed)
The current medical regimen is effective;  continue present plan and medications.  

## 2016-01-23 NOTE — Progress Notes (Signed)
BP (!) 107/95   Pulse (!) 101   Temp 98.2 F (36.8 C) (Oral)   Ht 5' 8.2" (1.732 m)   Wt 243 lb (110.2 kg)   SpO2 99%   BMI 36.73 kg/m    Subjective:    Patient ID: Matthew Collier, male    DOB: 1965-03-30, 51 y.o.   MRN: [id number]  HPI: Matthew Collier is a 51 y.o. male  Chief Complaint  Patient presents with  . Follow-up  . Diabetes   Patient follow-up diabetes doing well had to episodes of symptoms of possibility developing low blood sugar but patient recognized was able to avoid any low blood sugar spells. Taking medicines without problems or issues Blood pressure good control Cholesterol good control no complaints taking medications faithfully Relevant past medical, surgical, family and social history reviewed and updated as indicated. Interim medical history since our last visit reviewed. Allergies and medications reviewed and updated.  Review of Systems  Constitutional: Negative.   Respiratory: Negative.   Cardiovascular: Negative.     Per HPI unless specifically indicated above     Objective:    BP (!) 107/95   Pulse (!) 101   Temp 98.2 F (36.8 C) (Oral)   Ht 5' 8.2" (1.732 m)   Wt 243 lb (110.2 kg)   SpO2 99%   BMI 36.73 kg/m   Wt Readings from Last 3 Encounters:  01/23/16 243 lb (110.2 kg)  10/23/15 230 lb 3.2 oz (104.4 kg)  08/03/15 224 lb (101.6 kg)    Physical Exam  Constitutional: He is oriented to person, place, and time. He appears well-developed and well-nourished. No distress.  HENT:  Head: Normocephalic and atraumatic.  Right Ear: Hearing normal.  Left Ear: Hearing normal.  Nose: Nose normal.  Eyes: Conjunctivae and lids are normal. Right eye exhibits no discharge. Left eye exhibits no discharge. No scleral icterus.  Cardiovascular: Normal rate, regular rhythm and normal heart sounds.   Pulmonary/Chest: Effort normal and breath sounds normal. No respiratory distress.  Musculoskeletal: Normal range of motion.  Neurological: He is alert  and oriented to person, place, and time.  Skin: Skin is intact. No rash noted.  Psychiatric: He has a normal mood and affect. His speech is normal and behavior is normal. Judgment and thought content normal. Cognition and memory are normal.    Results for orders placed or performed in visit on 10/23/15  Microscopic Examination  Result Value Ref Range   WBC, UA 0-5 0 - 5 /hpf   RBC, UA None seen 0 - 2 /hpf   Epithelial Cells (non renal) None seen 0 - 10 /hpf   Mucus, UA Present (A) Not Estab.   Bacteria, UA None seen None seen/Few  Bayer DCA Hb A1c Waived  Result Value Ref Range   Bayer DCA Hb A1c Waived 7.4 (H) <7.0 %  CBC with Differential/Platelet  Result Value Ref Range   WBC 6.1 3.4 - 10.8 x10E3/uL   RBC 5.47 4.14 - 5.80 x10E6/uL   Hemoglobin 17.3 12.6 - 17.7 g/dL   Hematocrit 50.8 37.5 - 51.0 %   MCV 93 79 - 97 fL   MCH 31.6 26.6 - 33.0 pg   MCHC 34.1 31.5 - 35.7 g/dL   RDW 13.0 12.3 - 15.4 %   Platelets 204 150 - 379 x10E3/uL   Neutrophils 69 Not Estab. %   Lymphs 21 Not Estab. %   Monocytes 6 Not Estab. %   Eos 2 Not Estab. %  Basos 1 Not Estab. %   Neutrophils Absolute 4.3 1.4 - 7.0 x10E3/uL   Lymphocytes Absolute 1.3 0.7 - 3.1 x10E3/uL   Monocytes Absolute 0.4 0.1 - 0.9 x10E3/uL   EOS (ABSOLUTE) 0.1 0.0 - 0.4 x10E3/uL   Basophils Absolute 0.1 0.0 - 0.2 x10E3/uL   Immature Granulocytes 1 Not Estab. %   Immature Grans (Abs) 0.0 0.0 - 0.1 x10E3/uL  Comprehensive metabolic panel  Result Value Ref Range   Glucose 177 (H) 65 - 99 mg/dL   BUN 14 6 - 24 mg/dL   Creatinine, Ser 0.83 0.76 - 1.27 mg/dL   GFR calc non Af Amer 101 >59 mL/min/1.73   GFR calc Af Amer 117 >59 mL/min/1.73   BUN/Creatinine Ratio 17 9 - 20   Sodium 141 134 - 144 mmol/L   Potassium 4.8 3.5 - 5.2 mmol/L   Chloride 101 96 - 106 mmol/L   CO2 21 18 - 29 mmol/L   Calcium 9.7 8.7 - 10.2 mg/dL   Total Protein 7.4 6.0 - 8.5 g/dL   Albumin 4.6 3.5 - 5.5 g/dL   Globulin, Total 2.8 1.5 - 4.5 g/dL    Albumin/Globulin Ratio 1.6 1.2 - 2.2   Bilirubin Total 0.5 0.0 - 1.2 mg/dL   Alkaline Phosphatase 56 39 - 117 IU/L   AST 17 0 - 40 IU/L   ALT 33 0 - 44 IU/L  Lipid panel  Result Value Ref Range   Cholesterol, Total 154 100 - 199 mg/dL   Triglycerides 208 (H) 0 - 149 mg/dL   HDL 39 (L) >39 mg/dL   VLDL Cholesterol Cal 42 (H) 5 - 40 mg/dL   LDL Calculated 73 0 - 99 mg/dL   Chol/HDL Ratio 3.9 0.0 - 5.0 ratio units  PSA  Result Value Ref Range   Prostate Specific Ag, Serum 1.0 0.0 - 4.0 ng/mL  TSH  Result Value Ref Range   TSH 0.622 0.450 - 4.500 uIU/mL  Urinalysis, Routine w reflex microscopic (not at [facility])  Result Value Ref Range   Specific Gravity, UA <1.005 (L) 1.005 - 1.030   pH, UA 5.0 5.0 - 7.5   Color, UA Yellow Yellow   Appearance Ur Clear Clear   Leukocytes, UA Negative Negative   Protein, UA Negative Negative/Trace   Glucose, UA 3+ (A) Negative   Ketones, UA Negative Negative   RBC, UA Negative Negative   Bilirubin, UA Negative Negative   Urobilinogen, Ur 0.2 0.2 - 1.0 mg/dL   Nitrite, UA Negative Negative   Microscopic Examination See below:   Microalbumin, Urine Waived  Result Value Ref Range   Microalb, Ur Waived 10 0 - 19 mg/L   Creatinine, Urine Waived 50 10 - 300 mg/dL   Microalb/Creat Ratio 30-300 (H) <30 mg/g      Assessment & Plan:   Problem List Items Addressed This Visit      Cardiovascular and Mediastinum   Hypertension - Primary    The current medical regimen is effective;  continue present plan and medications.       Relevant Orders   Bayer DCA Hb A1c Waived (STAT)     Endocrine   Diabetes mellitus without complication ([place])    The current medical regimen is effective;  continue present plan and medications. Reviewed A1c of 7 which is doing okay but patient and we want to do better patient will get back on diet exercise will leave medications align hopefully will run into problems of overmedicated and need to reduce  medicines at some  point.       Relevant Medications   glipiZIDE (GLUCOTROL) 5 MG tablet   Other Relevant Orders   Bayer DCA Hb A1c Waived (STAT)     Other   Hyperlipidemia   Relevant Orders   Bayer DCA Hb A1c Waived (STAT)       Follow up plan: Return in about 3 months (around 04/22/2016) for Hemoglobin A1c, BMP,  Lipids, ALT, AST.

## 2016-01-24 LAB — BAYER DCA HB A1C WAIVED: HB A1C (BAYER DCA - WAIVED): 7 % — ABNORMAL HIGH (ref <7.0)

## 2016-04-24 ENCOUNTER — Ambulatory Visit (INDEPENDENT_AMBULATORY_CARE_PROVIDER_SITE_OTHER): Payer: BLUE CROSS/BLUE SHIELD | Admitting: Family Medicine

## 2016-04-24 ENCOUNTER — Encounter: Payer: Self-pay | Admitting: Family Medicine

## 2016-04-24 VITALS — BP 152/92 | HR 91 | Temp 98.0°F | Wt 260.0 lb

## 2016-04-24 DIAGNOSIS — E785 Hyperlipidemia, unspecified: Secondary | ICD-10-CM

## 2016-04-24 DIAGNOSIS — G8929 Other chronic pain: Secondary | ICD-10-CM

## 2016-04-24 DIAGNOSIS — I10 Essential (primary) hypertension: Secondary | ICD-10-CM

## 2016-04-24 DIAGNOSIS — M25512 Pain in left shoulder: Secondary | ICD-10-CM | POA: Diagnosis not present

## 2016-04-24 DIAGNOSIS — E119 Type 2 diabetes mellitus without complications: Secondary | ICD-10-CM | POA: Diagnosis not present

## 2016-04-24 NOTE — Progress Notes (Signed)
BP (!) 152/92   Pulse 91   Temp 98 F (36.7 C) (Oral)   Wt 260 lb (117.9 kg)   BMI 39.30 kg/m    Subjective:    Patient ID: Matthew Collier, male    DOB: Mar 28, 1965, 51 y.o.   MRN: [id number]  HPI: Matthew Collier is a 51 y.o. male  Chief Complaint  Patient presents with  . Follow-up  . Hypertension  . Shoulder Injury    Left. Fell on [employer].   Patient follow-up diabetes hypertension has been doing real well good control with diabetes noted low blood sugar spells no issues blood pressure and cholesterol also is been doing well with medications no issues.  Patient's biggest concern today is fell on the ice on his left shoulder with full impact and no bracing back this winter since that time his had left shoulder pain decreased range of motion and pain waking him at night. Seems to be getting worse instead of better over the ensuing months.  Relevant past medical, surgical, family and social history reviewed and updated as indicated. Interim medical history since our last visit reviewed. Allergies and medications reviewed and updated.  Review of Systems  Constitutional: Negative.   Respiratory: Negative.   Cardiovascular: Negative.     Per HPI unless specifically indicated above     Objective:    BP (!) 152/92   Pulse 91   Temp 98 F (36.7 C) (Oral)   Wt 260 lb (117.9 kg)   BMI 39.30 kg/m   Wt Readings from Last 3 Encounters:  04/24/16 260 lb (117.9 kg)  01/23/16 243 lb (110.2 kg)  10/23/15 230 lb 3.2 oz (104.4 kg)    Physical Exam  Constitutional: He is oriented to person, place, and time. He appears well-developed and well-nourished.  HENT:  Head: Normocephalic and atraumatic.  Eyes: Conjunctivae and EOM are normal.  Neck: Normal range of motion.  Cardiovascular: Normal rate, regular rhythm and normal heart sounds.   Pulmonary/Chest: Effort normal and breath sounds normal.  Musculoskeletal: Normal range of motion.  Decreased range of motion especially with arm  lifting weakness with isolation of rotator cuff.  Neurological: He is alert and oriented to person, place, and time.  Skin: No erythema.  Psychiatric: He has a normal mood and affect. His behavior is normal. Judgment and thought content normal.    Results for orders placed or performed in visit on 01/23/16  Bayer DCA Hb A1c Waived (STAT)  Result Value Ref Range   Bayer DCA Hb A1c Waived 7.0 (H) <7.0 %      Assessment & Plan:   Problem List Items Addressed This Visit      Cardiovascular and Mediastinum   Hypertension - Primary    The current medical regimen is effective;  continue present plan and medications.       Relevant Orders   Basic metabolic panel   Bayer DCA Hb A1c Waived   LP+ALT+AST Piccolo, [place]     Endocrine   Diabetes mellitus without complication ([place])    The current medical regimen is effective;  continue present plan and medications.       Relevant Orders   Basic metabolic panel   Bayer DCA Hb A1c Waived   LP+ALT+AST Piccolo, Waived     Other   Hyperlipidemia    The current medical regimen is effective;  continue present plan and medications.       Relevant Orders   Basic metabolic panel  Bayer DCA Hb A1c Waived   LP+ALT+AST Piccolo, Waived   Shoulder pain, left    Discuss shoulder pain with chronic nature now after acute injury will refer to orthopedics to further evaluate.      Relevant Orders   Ambulatory referral to Orthopedic Surgery       Follow up plan: Return in about 6 months (around 10/24/2016) for Physical Exam, Hemoglobin A1c.

## 2016-04-24 NOTE — Assessment & Plan Note (Signed)
The current medical regimen is effective;  continue present plan and medications.  

## 2016-04-24 NOTE — Assessment & Plan Note (Signed)
Discuss shoulder pain with chronic nature now after acute injury will refer to orthopedics to further evaluate.

## 2016-04-25 ENCOUNTER — Encounter: Payer: Self-pay | Admitting: Family Medicine

## 2016-04-25 LAB — LP+ALT+AST PICCOLO, WAIVED
ALT (SGPT) Piccolo, Waived: 40 U/L (ref 10–47)
AST (SGOT) PICCOLO, WAIVED: 30 U/L (ref 11–38)
CHOLESTEROL PICCOLO, WAIVED: 149 mg/dL (ref <200)
Chol/HDL Ratio Piccolo,Waive: 4.2 mg/dL
HDL CHOL PICCOLO, WAIVED: 36 mg/dL — AB (ref >59)
LDL Chol Calc Piccolo Waived: 38 mg/dL (ref <100)
Triglycerides Piccolo,Waived: 376 mg/dL — ABNORMAL HIGH (ref <150)
VLDL Chol Calc Piccolo,Waive: 75 mg/dL — ABNORMAL HIGH (ref <30)

## 2016-04-25 LAB — BASIC METABOLIC PANEL
BUN / CREAT RATIO: 22 — AB (ref 9–20)
BUN: 17 mg/dL (ref 6–24)
CALCIUM: 9.6 mg/dL (ref 8.7–10.2)
CHLORIDE: 102 mmol/L (ref 96–106)
CO2: 23 mmol/L (ref 18–29)
CREATININE: 0.78 mg/dL (ref 0.76–1.27)
GFR calc Af Amer: 119 mL/min/{1.73_m2} (ref >59)
GFR, EST NON AFRICAN AMERICAN: 103 mL/min/{1.73_m2} (ref >59)
Glucose: 113 mg/dL — ABNORMAL HIGH (ref 65–99)
Potassium: 4.6 mmol/L (ref 3.5–5.2)
Sodium: 143 mmol/L (ref 134–144)

## 2016-04-25 LAB — BAYER DCA HB A1C WAIVED: HB A1C (BAYER DCA - WAIVED): 7 % — ABNORMAL HIGH (ref <7.0)

## 2016-05-09 ENCOUNTER — Telehealth: Payer: Self-pay | Admitting: Family Medicine

## 2016-05-09 NOTE — Telephone Encounter (Signed)
Patient's wife called in regards to see if patient was referred to an orthopedic or if the patient had to make an orthopedic appointment themselves. Patient's wife is unsure if they should be waiting on a referral to the orthopedic or scheduling the appointment themselves. Please Advise.  Wife Malaky Tetrault) contact number: [phone]  Thank you.

## 2016-05-09 NOTE — Telephone Encounter (Signed)
Relayed info to wife.  

## 2016-05-09 NOTE — Telephone Encounter (Signed)
Per referral in Epic, " General 04/25/2016 11:47 AM Sandria Manly, CMA - -  Note   From Jinger Neighbors at [facility]  "Have contacted patient but he has a Programmer, multimedia we are discussing and then we will schedule"        "  [facility] Address: [place], [place], [place] [address] Hours: [place] Born soon ? 9AM Phone: [phone]

## 2016-07-23 ENCOUNTER — Ambulatory Visit (INDEPENDENT_AMBULATORY_CARE_PROVIDER_SITE_OTHER): Payer: BLUE CROSS/BLUE SHIELD | Admitting: Unknown Physician Specialty

## 2016-07-23 ENCOUNTER — Encounter: Payer: Self-pay | Admitting: Unknown Physician Specialty

## 2016-07-23 VITALS — BP 138/81 | HR 86 | Temp 98.3°F | Wt 234.0 lb

## 2016-07-23 DIAGNOSIS — N410 Acute prostatitis: Secondary | ICD-10-CM | POA: Diagnosis not present

## 2016-07-23 DIAGNOSIS — R1030 Lower abdominal pain, unspecified: Secondary | ICD-10-CM | POA: Diagnosis not present

## 2016-07-23 MED ORDER — SULFAMETHOXAZOLE-TRIMETHOPRIM 800-160 MG PO TABS: 1.0000 | ORAL_TABLET | Freq: Two times a day (BID) | ORAL | 0 refills | Status: DC

## 2016-07-23 NOTE — Progress Notes (Signed)
BP 138/81 (BP Location: Left Arm, Cuff Size: Normal)   Pulse 86   Temp 98.3 F (36.8 C)   Wt 234 lb (106.1 kg)   SpO2 97%   BMI 35.37 kg/m    Subjective:    Patient ID: Matthew Collier, male    DOB: 02/17/1965, 51 y.o.   MRN: [id number]  HPI: Matthew Collier is a 51 y.o. male  Chief Complaint  Patient presents with  . Abdominal Pain    pt states he has been having lower abdominal pain for a few weeks, states he has had this before with a prostate infection   Pelvic pain Pt states he has some pelvic pain off an on for about 2 weeks.  Seemed to be worse while urinating last night. No fever.  States he had a prostate infection in the past.    Relevant past medical, surgical, family and social history reviewed and updated as indicated. Interim medical history since our last visit reviewed. Allergies and medications reviewed and updated.  Review of Systems  Per HPI unless specifically indicated above     Objective:    BP 138/81 (BP Location: Left Arm, Cuff Size: Normal)   Pulse 86   Temp 98.3 F (36.8 C)   Wt 234 lb (106.1 kg)   SpO2 97%   BMI 35.37 kg/m   Wt Readings from Last 3 Encounters:  07/23/16 234 lb (106.1 kg)  04/24/16 260 lb (117.9 kg)  01/23/16 243 lb (110.2 kg)    Physical Exam  Constitutional: He is oriented to person, place, and time. He appears well-developed and well-nourished. No distress.  HENT:  Head: Normocephalic and atraumatic.  Eyes: Conjunctivae and lids are normal. Right eye exhibits no discharge. Left eye exhibits no discharge. No scleral icterus.  Cardiovascular: Normal rate.   Pulmonary/Chest: Effort normal.  Abdominal: Normal appearance. There is no splenomegaly or hepatomegaly.  Genitourinary: Rectum normal. Prostate is enlarged and tender.  Musculoskeletal: Normal range of motion.  Neurological: He is alert and oriented to person, place, and time.  Skin: Skin is intact. No rash noted. No pallor.  Psychiatric: He has a normal mood and  affect. His behavior is normal. Judgment and thought content normal.   Urine is normal except for expected 3+ glucose  Results for orders placed or performed in visit on 32/99/24  Basic metabolic panel  Result Value Ref Range   Glucose 113 (H) 65 - 99 mg/dL   BUN 17 6 - 24 mg/dL   Creatinine, Ser 0.78 0.76 - 1.27 mg/dL   GFR calc non Af Amer 103 >59 mL/min/1.73   GFR calc Af Amer 119 >59 mL/min/1.73   BUN/Creatinine Ratio 22 (H) 9 - 20   Sodium 143 134 - 144 mmol/L   Potassium 4.6 3.5 - 5.2 mmol/L   Chloride 102 96 - 106 mmol/L   CO2 23 18 - 29 mmol/L   Calcium 9.6 8.7 - 10.2 mg/dL  Bayer DCA Hb A1c Waived  Result Value Ref Range   Bayer DCA Hb A1c Waived 7.0 (H) <7.0 %  LP+ALT+AST Piccolo, Waived  Result Value Ref Range   ALT (SGPT) Piccolo, Waived 40 10 - 47 U/L   AST (SGOT) Piccolo, Waived 30 11 - 38 U/L   Cholesterol Piccolo, Waived 149 <200 mg/dL   HDL Chol Piccolo, Waived 36 (L) >59 mg/dL   Triglycerides Piccolo,Waived 376 (H) <150 mg/dL   Chol/HDL Ratio Piccolo,Waive 4.2 mg/dL   LDL Chol Calc Piccolo Waived 38 <  100 mg/dL   VLDL Chol Calc Piccolo,Waive 75 (H) <30 mg/dL      Assessment & Plan:   Problem List Items Addressed This Visit    None    Visit Diagnoses    Lower abdominal pain    -  Primary   Relevant Orders   UA/M w/rflx Culture, Routine   Acute prostatitis       Will rx Septra BID for 4 weeks.  Pt ed       Follow up plan: Return for for PE with Dr. Jeananne Rama.

## 2016-07-23 NOTE — Patient Instructions (Addendum)
Prostatitis Prostatitis is swelling or inflammation of the prostate gland. The prostate is a walnut-sized gland that is involved in the production of semen. It is located below a man's bladder, in front of the rectum. There are four types of prostatitis:  Chronic nonbacterial prostatitis. This is the most common type of prostatitis. It may be associated with a viral infection or autoimmune disorder.  Acute bacterial prostatitis. This is the least common type of prostatitis. It starts quickly and is usually associated with a bladder infection, high fever, and shaking chills. It can occur at any age.  Chronic bacterial prostatitis. This type usually results from acute bacterial prostatitis that happens repeatedly (is recurrent) or has not been treated properly. It can occur in men of any age but is most common among middle-aged men whose prostate has begun to get larger. The symptoms are not as severe as symptoms caused by acute bacterial prostatitis.  Prostatodynia or chronic pelvic pain syndrome (CPPS). This type is also called pelvic floor disorder. It is associated with increased muscular tone in the pelvis surrounding the prostate. What are the causes? Bacterial prostatitis is caused by infection from bacteria. Chronic nonbacterial prostatitis may be caused by:  Urinary tract infections (UTIs).  Nerve damage.  A response by the body's disease-fighting system (autoimmune response).  Chemicals in the urine. The causes of the other types of prostatitis are usually not known. What are the signs or symptoms? Symptoms of this condition vary depending upon the type of prostatitis. If you have acute bacterial prostatitis, you may experience:  Urinary symptoms, such as:  Painful urination.  Burning during urination.  Frequent and sudden urges to urinate.  Inability to start urinating.  A weak or interrupted stream of urine.  Vomiting.  Nausea.  Fever.  Chills.  Inability to  empty the bladder completely.  Pain in the:  Muscles or joints.  Lower back.  Lower abdomen. If you have any of the other types of prostatitis, you may experience:  Urinary symptoms, such as:  Sudden urges to urinate.  Frequent urination.  Difficulty starting urination.  Weak urine stream.  Dribbling after urination.  Discharge from the urethra. The urethra is a tube that opens at the end of the penis.  Pain in the:  Testicles.  Penis or tip of the penis.  Rectum.  Area in front of the rectum and below the scrotum (perineum).  Problems with sexual function.  Painful ejaculation.  Bloody semen. How is this diagnosed? This condition may be diagnosed based on:  A physical and medical exam.  Your symptoms.  A urine test to check for bacteria.  An exam in which a health care provider uses a finger to feel the prostate (digital rectal exam).  A test of a sample of semen.  Blood tests.  Ultrasound.  Removal of prostate tissue to be examined under a microscope (biopsy).  Tests to check how your body handles urine (urodynamic tests).  A test to look inside your bladder or urethra (cystoscopy). How is this treated? Treatment for this condition depends on the type of prostatitis. Treatment may involve:  Medicines to relieve pain or inflammation.  Medicines to help relax your muscles.  Physical therapy.  Heat therapy.  Techniques to help you control certain body functions (biofeedback).  Relaxation exercises.  Antibiotic medicine, if your condition is caused by bacteria.  Warm water baths (sitz baths). Sitz baths help with relaxing your pelvic floor muscles, which helps to relieve pressure on the prostate. Follow   these instructions at home:  Take over-the-counter and prescription medicines only as told by your health care provider.  If you were prescribed an antibiotic, take it as told by your health care provider. Do not stop taking the  antibiotic even if you start to feel better.  If physical therapy, biofeedback, or relaxation exercises were prescribed, do exercises as instructed.  Take sitz baths as directed by your health care provider. For a sitz bath, sit in warm water that is deep enough to cover your hips and buttocks.  Keep all follow-up visits as told by your health care provider. This is important. Contact a health care provider if:  Your symptoms get worse.  You have a fever. Get help right away if:  You have chills.  You feel nauseous.  You vomit.  You feel light-headed or feel like you are going to faint.  You are unable to urinate.  You have blood or blood clots in your urine. This information is not intended to replace advice given to you by your health care provider. Make sure you discuss any questions you have with your health care provider. Document Released: 12/22/1999 Document Revised: 09/14/2015 Document Reviewed: 09/14/2015 Elsevier Interactive Patient Education  2017 [employer]  

## 2016-08-01 LAB — UA/M W/RFLX CULTURE, ROUTINE
Bilirubin, UA: NEGATIVE
Leukocytes, UA: NEGATIVE
Nitrite, UA: NEGATIVE
PROTEIN UA: NEGATIVE
RBC, UA: NEGATIVE
Specific Gravity, UA: >1.03 — ABNORMAL HIGH (ref 1.005–1.030)
UUROB: 0.2 mg/dL (ref 0.2–1.0)
pH, UA: 5.5 (ref 5.0–7.5)

## 2016-08-01 LAB — MICROSCOPIC EXAMINATION
Bacteria, UA: NONE SEEN
Epithelial Cells (non renal): NONE SEEN /hpf (ref 0–10)
RBC, UA: NONE SEEN /hpf (ref 0–?)
Renal Epithel, UA: NONE SEEN /hpf
WBC UA: NONE SEEN /HPF (ref 0–?)

## 2016-08-05 LAB — HM DIABETES EYE EXAM

## 2016-10-31 ENCOUNTER — Encounter: Payer: Self-pay | Admitting: Family Medicine

## 2016-10-31 ENCOUNTER — Ambulatory Visit (INDEPENDENT_AMBULATORY_CARE_PROVIDER_SITE_OTHER): Payer: BLUE CROSS/BLUE SHIELD | Admitting: Family Medicine

## 2016-10-31 VITALS — BP 134/85 | HR 68 | Ht 69.69 in | Wt 224.0 lb

## 2016-10-31 DIAGNOSIS — Z0000 Encounter for general adult medical examination without abnormal findings: Secondary | ICD-10-CM | POA: Diagnosis not present

## 2016-10-31 DIAGNOSIS — M25512 Pain in left shoulder: Secondary | ICD-10-CM | POA: Diagnosis not present

## 2016-10-31 DIAGNOSIS — E785 Hyperlipidemia, unspecified: Secondary | ICD-10-CM

## 2016-10-31 DIAGNOSIS — G8929 Other chronic pain: Secondary | ICD-10-CM | POA: Diagnosis not present

## 2016-10-31 DIAGNOSIS — E7800 Pure hypercholesterolemia, unspecified: Secondary | ICD-10-CM | POA: Diagnosis not present

## 2016-10-31 DIAGNOSIS — I10 Essential (primary) hypertension: Secondary | ICD-10-CM | POA: Diagnosis not present

## 2016-10-31 DIAGNOSIS — E119 Type 2 diabetes mellitus without complications: Secondary | ICD-10-CM | POA: Diagnosis not present

## 2016-10-31 LAB — URINALYSIS, ROUTINE W REFLEX MICROSCOPIC
Bilirubin, UA: NEGATIVE
Ketones, UA: NEGATIVE
Leukocytes, UA: NEGATIVE
Nitrite, UA: NEGATIVE
PH UA: 5.5 (ref 5.0–7.5)
PROTEIN UA: NEGATIVE
RBC, UA: NEGATIVE
Specific Gravity, UA: 1.015 (ref 1.005–1.030)
Urobilinogen, Ur: 0.2 mg/dL (ref 0.2–1.0)

## 2016-10-31 LAB — MICROSCOPIC EXAMINATION
BACTERIA UA: NONE SEEN
RBC MICROSCOPIC, UA: NONE SEEN /HPF (ref 0–?)
WBC, UA: NONE SEEN /hpf (ref 0–?)

## 2016-10-31 LAB — BAYER DCA HB A1C WAIVED: HB A1C (BAYER DCA - WAIVED): 6.1 % (ref <7.0)

## 2016-10-31 MED ORDER — BENAZEPRIL HCL 40 MG PO TABS: 40.0000 mg | ORAL_TABLET | Freq: Every day | ORAL | 4 refills | Status: DC

## 2016-10-31 MED ORDER — SIMVASTATIN 40 MG PO TABS: 40.0000 mg | ORAL_TABLET | Freq: Every day | ORAL | 4 refills | Status: DC

## 2016-10-31 MED ORDER — GLIPIZIDE 5 MG PO TABS: 5.0000 mg | ORAL_TABLET | Freq: Every day | ORAL | 4 refills | Status: DC

## 2016-10-31 MED ORDER — DAPAGLIFLOZIN PRO-METFORMIN ER 5-1000 MG PO TB24: 1.0000 | ORAL_TABLET | Freq: Two times a day (BID) | ORAL | 4 refills | Status: DC

## 2016-10-31 MED ORDER — EXENATIDE ER 2 MG ~~LOC~~ PEN: PEN_INJECTOR | SUBCUTANEOUS | 4 refills | Status: DC

## 2016-10-31 NOTE — Assessment & Plan Note (Signed)
Patient on ketone diet taking medications labs pending

## 2016-10-31 NOTE — Assessment & Plan Note (Signed)
The current medical regimen is effective;  continue present plan and medications.  

## 2016-10-31 NOTE — Progress Notes (Signed)
BP 134/85   Pulse 68   Ht 5' 9.69" (1.77 m)   Wt 224 lb (101.6 kg)   SpO2 98%   BMI 32.43 kg/m    Subjective:    Patient ID: Matthew Collier, male    DOB: 07-27-1965, 51 y.o.   MRN: [id number]  HPI: Matthew Collier is a 51 y.o. male  Chief Complaint  Patient presents with  . Annual Exam   Patient doing really well has lost 36 pounds doing the ketone diet for the last 3 months. Has been able to cut back on Januvia not taking anymore. Not taking glipizide also. No low blood sugar spells and sugars running around 100. Cholesterol doing okay. Relevant past medical, surgical, family and social history reviewed and updated as indicated. Interim medical history since our last visit reviewed. Allergies and medications reviewed and updated.  Review of Systems  Constitutional: Negative.   HENT: Negative.   Eyes: Negative.   Respiratory: Negative.   Cardiovascular: Negative.   Gastrointestinal: Negative.   Endocrine: Negative.   Genitourinary: Negative.   Musculoskeletal: Negative.   Skin: Negative.   Allergic/Immunologic: Negative.   Neurological: Negative.   Hematological: Negative.   Psychiatric/Behavioral: Negative.     Per HPI unless specifically indicated above     Objective:    BP 134/85   Pulse 68   Ht 5' 9.69" (1.77 m)   Wt 224 lb (101.6 kg)   SpO2 98%   BMI 32.43 kg/m   Wt Readings from Last 3 Encounters:  10/31/16 224 lb (101.6 kg)  07/23/16 234 lb (106.1 kg)  04/24/16 260 lb (117.9 kg)    Physical Exam  Constitutional: He is oriented to person, place, and time. He appears well-developed and well-nourished.  HENT:  Head: Normocephalic and atraumatic.  Right Ear: External ear normal.  Left Ear: External ear normal.  Eyes: Pupils are equal, round, and reactive to light. Conjunctivae and EOM are normal.  Neck: Normal range of motion. Neck supple.  Cardiovascular: Normal rate, regular rhythm, normal heart sounds and intact distal pulses.   Pulmonary/Chest:  Effort normal and breath sounds normal.  Abdominal: Soft. Bowel sounds are normal. There is no splenomegaly or hepatomegaly.  Genitourinary: Rectum normal, prostate normal and penis normal.  Musculoskeletal: Normal range of motion.  Neurological: He is alert and oriented to person, place, and time. He has normal reflexes.  Skin: No rash noted. No erythema.  Psychiatric: He has a normal mood and affect. His behavior is normal. Judgment and thought content normal.    Results for orders placed or performed in visit on 08/06/16  HM DIABETES EYE EXAM  Result Value Ref Range   HM Diabetic Eye Exam No Retinopathy No Retinopathy      Assessment & Plan:   Problem List Items Addressed This Visit      Cardiovascular and Mediastinum   Hypertension - Primary    The current medical regimen is effective;  continue present plan and medications.       Relevant Medications   benazepril (LOTENSIN) 40 MG tablet   simvastatin (ZOCOR) 40 MG tablet   Other Relevant Orders   Comprehensive metabolic panel   CBC with Differential/Platelet     Endocrine   Diabetes mellitus without complication ([place])    The current medical regimen is effective;  continue present plan and medications.       Relevant Medications   benazepril (LOTENSIN) 40 MG tablet   Dapagliflozin-Metformin HCl ER (XIGDUO XR) 05-998 MG  TB24   Exenatide ER (BYDUREON) 2 MG PEN   simvastatin (ZOCOR) 40 MG tablet   Other Relevant Orders   CBC with Differential/Platelet   Urinalysis, Routine w reflex microscopic   Bayer DCA Hb A1c Waived     Other   Hyperlipidemia    Patient on ketone diet taking medications labs pending      Relevant Medications   benazepril (LOTENSIN) 40 MG tablet   simvastatin (ZOCOR) 40 MG tablet   Other Relevant Orders   Lipid panel   Shoulder pain, left    Other Visit Diagnoses    PE (physical exam), annual       Relevant Orders   Comprehensive metabolic panel   CBC with Differential/Platelet    Lipid panel   PSA   TSH   Urinalysis, Routine w reflex microscopic       Follow up plan: Return in about 3 months (around 01/31/2017) for Hemoglobin A1c.

## 2016-11-01 ENCOUNTER — Encounter: Payer: Self-pay | Admitting: Family Medicine

## 2016-11-01 LAB — COMPREHENSIVE METABOLIC PANEL
A/G RATIO: 2 (ref 1.2–2.2)
ALBUMIN: 4.5 g/dL (ref 3.5–5.5)
ALK PHOS: 60 IU/L (ref 39–117)
ALT: 28 IU/L (ref 0–44)
AST: 22 IU/L (ref 0–40)
BILIRUBIN TOTAL: 0.5 mg/dL (ref 0.0–1.2)
BUN / CREAT RATIO: 16 (ref 9–20)
BUN: 14 mg/dL (ref 6–24)
CHLORIDE: 106 mmol/L (ref 96–106)
CO2: 22 mmol/L (ref 20–29)
Calcium: 9.6 mg/dL (ref 8.7–10.2)
Creatinine, Ser: 0.9 mg/dL (ref 0.76–1.27)
GFR calc Af Amer: 112 mL/min/{1.73_m2} (ref >59)
GFR calc non Af Amer: 97 mL/min/{1.73_m2} (ref >59)
Globulin, Total: 2.3 g/dL (ref 1.5–4.5)
Glucose: 140 mg/dL — ABNORMAL HIGH (ref 65–99)
POTASSIUM: 5.2 mmol/L (ref 3.5–5.2)
SODIUM: 144 mmol/L (ref 134–144)
Total Protein: 6.8 g/dL (ref 6.0–8.5)

## 2016-11-01 LAB — PSA: Prostate Specific Ag, Serum: 1.6 ng/mL (ref 0.0–4.0)

## 2016-11-01 LAB — CBC WITH DIFFERENTIAL/PLATELET
BASOS: 1 %
Basophils Absolute: 0.1 10*3/uL (ref 0.0–0.2)
EOS (ABSOLUTE): 0.1 10*3/uL (ref 0.0–0.4)
EOS: 1 %
HEMATOCRIT: 48.7 % (ref 37.5–51.0)
Hemoglobin: 16.8 g/dL (ref 13.0–17.7)
IMMATURE GRANS (ABS): 0 10*3/uL (ref 0.0–0.1)
Immature Granulocytes: 0 %
LYMPHS: 22 %
Lymphocytes Absolute: 1.3 10*3/uL (ref 0.7–3.1)
MCH: 32.3 pg (ref 26.6–33.0)
MCHC: 34.5 g/dL (ref 31.5–35.7)
MCV: 94 fL (ref 79–97)
MONOS ABS: 0.4 10*3/uL (ref 0.1–0.9)
Monocytes: 7 %
NEUTROS ABS: 3.9 10*3/uL (ref 1.4–7.0)
Neutrophils: 69 %
PLATELETS: 186 10*3/uL (ref 150–379)
RBC: 5.2 x10E6/uL (ref 4.14–5.80)
RDW: 13.3 % (ref 12.3–15.4)
WBC: 5.7 10*3/uL (ref 3.4–10.8)

## 2016-11-01 LAB — TSH: TSH: 0.594 u[IU]/mL (ref 0.450–4.500)

## 2016-11-01 LAB — LIPID PANEL
Chol/HDL Ratio: 4 ratio (ref 0.0–5.0)
Cholesterol, Total: 141 mg/dL (ref 100–199)
HDL: 35 mg/dL — ABNORMAL LOW (ref >39)
LDL CALC: 72 mg/dL (ref 0–99)
TRIGLYCERIDES: 168 mg/dL — AB (ref 0–149)
VLDL Cholesterol Cal: 34 mg/dL (ref 5–40)

## 2017-02-03 ENCOUNTER — Encounter: Payer: Self-pay | Admitting: Family Medicine

## 2017-02-03 ENCOUNTER — Ambulatory Visit: Payer: BLUE CROSS/BLUE SHIELD | Admitting: Family Medicine

## 2017-02-03 VITALS — BP 128/81 | HR 81 | Wt 224.0 lb

## 2017-02-03 DIAGNOSIS — E785 Hyperlipidemia, unspecified: Secondary | ICD-10-CM

## 2017-02-03 DIAGNOSIS — E119 Type 2 diabetes mellitus without complications: Secondary | ICD-10-CM

## 2017-02-03 DIAGNOSIS — I10 Essential (primary) hypertension: Secondary | ICD-10-CM

## 2017-02-03 LAB — BAYER DCA HB A1C WAIVED: HB A1C (BAYER DCA - WAIVED): 6.9 % (ref <7.0)

## 2017-02-03 NOTE — Assessment & Plan Note (Signed)
The current medical regimen is effective;  continue present plan and medications.  

## 2017-02-03 NOTE — Progress Notes (Signed)
BP 128/81   Pulse 81   Wt 224 lb (101.6 kg)   SpO2 98%   BMI 32.43 kg/m    Subjective:    Patient ID: Matthew Collier, male    DOB: 19-May-1965, 52 y.o.   MRN: [id number]  HPI: Matthew Collier is a 52 y.o. male  Recheck diabetes hypercholesterol hypertension doing well without any problems or issues takes medications with no low blood sugar spells are concerns and takes faithfully.  Relevant past medical, surgical, family and social history reviewed and updated as indicated. Interim medical history since our last visit reviewed. Allergies and medications reviewed and updated.  Review of Systems  Constitutional: Negative.   Respiratory: Negative.   Cardiovascular: Negative.     Per HPI unless specifically indicated above     Objective:    BP 128/81   Pulse 81   Wt 224 lb (101.6 kg)   SpO2 98%   BMI 32.43 kg/m   Wt Readings from Last 3 Encounters:  02/03/17 224 lb (101.6 kg)  10/31/16 224 lb (101.6 kg)  07/23/16 234 lb (106.1 kg)    Physical Exam  Constitutional: He is oriented to person, place, and time. He appears well-developed and well-nourished.  HENT:  Head: Normocephalic and atraumatic.  Eyes: Conjunctivae and EOM are normal.  Neck: Normal range of motion.  Cardiovascular: Normal rate, regular rhythm and normal heart sounds.  Pulmonary/Chest: Effort normal and breath sounds normal.  Musculoskeletal: Normal range of motion.  Neurological: He is alert and oriented to person, place, and time.  Skin: No erythema.  Psychiatric: He has a normal mood and affect. His behavior is normal. Judgment and thought content normal.    Results for orders placed or performed in visit on 10/31/16  Microscopic Examination  Result Value Ref Range   WBC, UA None seen 0 - 5 /hpf   RBC, UA None seen 0 - 2 /hpf   Epithelial Cells (non renal) CANCELED    Bacteria, UA None seen None seen/Few  Comprehensive metabolic panel  Result Value Ref Range   Glucose 140 (H) 65 - 99 mg/dL   BUN 14 6 - 24 mg/dL   Creatinine, Ser 0.90 0.76 - 1.27 mg/dL   GFR calc non Af Amer 97 >59 mL/min/1.73   GFR calc Af Amer 112 >59 mL/min/1.73   BUN/Creatinine Ratio 16 9 - 20   Sodium 144 134 - 144 mmol/L   Potassium 5.2 3.5 - 5.2 mmol/L   Chloride 106 96 - 106 mmol/L   CO2 22 20 - 29 mmol/L   Calcium 9.6 8.7 - 10.2 mg/dL   Total Protein 6.8 6.0 - 8.5 g/dL   Albumin 4.5 3.5 - 5.5 g/dL   Globulin, Total 2.3 1.5 - 4.5 g/dL   Albumin/Globulin Ratio 2.0 1.2 - 2.2   Bilirubin Total 0.5 0.0 - 1.2 mg/dL   Alkaline Phosphatase 60 39 - 117 IU/L   AST 22 0 - 40 IU/L   ALT 28 0 - 44 IU/L  CBC with Differential/Platelet  Result Value Ref Range   WBC 5.7 3.4 - 10.8 x10E3/uL   RBC 5.20 4.14 - 5.80 x10E6/uL   Hemoglobin 16.8 13.0 - 17.7 g/dL   Hematocrit 48.7 37.5 - 51.0 %   MCV 94 79 - 97 fL   MCH 32.3 26.6 - 33.0 pg   MCHC 34.5 31.5 - 35.7 g/dL   RDW 13.3 12.3 - 15.4 %   Platelets 186 150 - 379 x10E3/uL   Neutrophils  69 Not Estab. %   Lymphs 22 Not Estab. %   Monocytes 7 Not Estab. %   Eos 1 Not Estab. %   Basos 1 Not Estab. %   Neutrophils Absolute 3.9 1.4 - 7.0 x10E3/uL   Lymphocytes Absolute 1.3 0.7 - 3.1 x10E3/uL   Monocytes Absolute 0.4 0.1 - 0.9 x10E3/uL   EOS (ABSOLUTE) 0.1 0.0 - 0.4 x10E3/uL   Basophils Absolute 0.1 0.0 - 0.2 x10E3/uL   Immature Granulocytes 0 Not Estab. %   Immature Grans (Abs) 0.0 0.0 - 0.1 x10E3/uL  Lipid panel  Result Value Ref Range   Cholesterol, Total 141 100 - 199 mg/dL   Triglycerides 168 (H) 0 - 149 mg/dL   HDL 35 (L) >39 mg/dL   VLDL Cholesterol Cal 34 5 - 40 mg/dL   LDL Calculated 72 0 - 99 mg/dL   Chol/HDL Ratio 4.0 0.0 - 5.0 ratio  PSA  Result Value Ref Range   Prostate Specific Ag, Serum 1.6 0.0 - 4.0 ng/mL  TSH  Result Value Ref Range   TSH 0.594 0.450 - 4.500 uIU/mL  Urinalysis, Routine w reflex microscopic  Result Value Ref Range   Specific Gravity, UA 1.015 1.005 - 1.030   pH, UA 5.5 5.0 - 7.5   Color, UA Yellow Yellow    Appearance Ur Clear Clear   Leukocytes, UA Negative Negative   Protein, UA Negative Negative/Trace   Glucose, UA 3+ (A) Negative   Ketones, UA Negative Negative   RBC, UA Negative Negative   Bilirubin, UA Negative Negative   Urobilinogen, Ur 0.2 0.2 - 1.0 mg/dL   Nitrite, UA Negative Negative   Microscopic Examination See below:   Bayer DCA Hb A1c Waived  Result Value Ref Range   Bayer DCA Hb A1c Waived 6.1 <7.0 %      Assessment & Plan:   Problem List Items Addressed This Visit      Cardiovascular and Mediastinum   Hypertension    The current medical regimen is effective;  continue present plan and medications.         Endocrine   Diabetes mellitus without complication ([place]) - Primary    The current medical regimen is effective;  continue present plan and medications.       Relevant Orders   Bayer DCA Hb A1c Waived     Other   Hyperlipidemia    The current medical regimen is effective;  continue present plan and medications.           Follow up plan: Return in about 3 months (around 05/04/2017) for Hemoglobin A1c, BMP,  Lipids, ALT, AST.

## 2017-03-17 ENCOUNTER — Telehealth: Payer: Self-pay

## 2017-03-17 DIAGNOSIS — E119 Type 2 diabetes mellitus without complications: Secondary | ICD-10-CM

## 2017-03-17 MED ORDER — DAPAGLIFLOZIN PRO-METFORMIN ER 5-1000 MG PO TB24: 1.0000 | ORAL_TABLET | Freq: Two times a day (BID) | ORAL | 4 refills | Status: DC

## 2017-03-17 NOTE — Telephone Encounter (Signed)
Rx sent to his pharmacy for 180 pills

## 2017-03-17 NOTE — Telephone Encounter (Signed)
Received a PA on this patient for his Xigduo.  This is probably for quantity limit as it's written for 270 pills w/ take 1 tablet 2 times daily.  This is a 130 day supply request. I think this is supposed to be for 180 tablets for 90 day supply. Could we change the quantity and see if insurance won't pay for it or keep it as is and attempt PA.

## 2017-05-05 ENCOUNTER — Encounter: Payer: Self-pay | Admitting: Family Medicine

## 2017-05-05 ENCOUNTER — Ambulatory Visit: Payer: BLUE CROSS/BLUE SHIELD | Admitting: Family Medicine

## 2017-05-05 VITALS — BP 126/84 | HR 82 | Ht 70.0 in | Wt 227.0 lb

## 2017-05-05 DIAGNOSIS — I10 Essential (primary) hypertension: Secondary | ICD-10-CM | POA: Diagnosis not present

## 2017-05-05 DIAGNOSIS — E785 Hyperlipidemia, unspecified: Secondary | ICD-10-CM

## 2017-05-05 DIAGNOSIS — E119 Type 2 diabetes mellitus without complications: Secondary | ICD-10-CM

## 2017-05-05 DIAGNOSIS — Z23 Encounter for immunization: Secondary | ICD-10-CM | POA: Diagnosis not present

## 2017-05-05 NOTE — Assessment & Plan Note (Signed)
The current medical regimen is effective;  continue present plan and medications.  

## 2017-05-05 NOTE — Assessment & Plan Note (Signed)
Elevated trig diet

## 2017-05-05 NOTE — Assessment & Plan Note (Signed)
Discussed diabetes poor control.  Discussed need to get back on diet and have proper nutrition with weight loss for control.

## 2017-05-05 NOTE — Progress Notes (Signed)
   BP 126/84   Pulse 82   Ht 5\' 10"  (1.778 m)   Wt 227 lb (103 kg)   SpO2 98%   BMI 32.57 kg/m    Subjective:    Patient ID: Matthew Collier, male    DOB: [birth year], 52 y.o.   MRN: [id number]  HPI: Matthew Collier is a 52 y.o. male  Chief Complaint  Patient presents with  . Follow-up  . Diabetes  . Hypertension  Patient all in all doing well concerned with dietary indiscretion and weight gain his blood sugars gone up.  Taking medications as home list.  Blood pressure cholesterol good control but blood sugar concerned has gone up.  Relevant past medical, surgical, family and social history reviewed and updated as indicated. Interim medical history since our last visit reviewed. Allergies and medications reviewed and updated.  Review of Systems  Constitutional: Negative.   Respiratory: Negative.   Cardiovascular: Negative.     Per HPI unless specifically indicated above     Objective:    BP 126/84   Pulse 82   Ht 5\' 10"  (1.778 m)   Wt 227 lb (103 kg)   SpO2 98%   BMI 32.57 kg/m   Wt Readings from Last 3 Encounters:  05/05/17 227 lb (103 kg)  02/03/17 224 lb (101.6 kg)  10/31/16 224 lb (101.6 kg)    Physical Exam  Constitutional: He is oriented to person, place, and time. He appears well-developed and well-nourished.  HENT:  Head: Normocephalic and atraumatic.  Eyes: Conjunctivae and EOM are normal.  Neck: Normal range of motion.  Cardiovascular: Normal rate, regular rhythm and normal heart sounds.  Pulmonary/Chest: Effort normal and breath sounds normal.  Musculoskeletal: Normal range of motion.  Neurological: He is alert and oriented to person, place, and time.  Skin: No erythema.  Psychiatric: He has a normal mood and affect. His behavior is normal. Judgment and thought content normal.    Results for orders placed or performed in visit on 02/03/17  Bayer DCA Hb A1c Waived  Result Value Ref Range   Bayer DCA Hb A1c Waived 6.9 <7.0 %      Assessment & Plan:     Problem List Items Addressed This Visit      Cardiovascular and Mediastinum   Hypertension - Primary    The current medical regimen is effective;  continue present plan and medications.       Relevant Orders   Bayer DCA Hb A1c Waived   Basic metabolic panel   LP+ALT+AST [place], Waived     Endocrine   Diabetes mellitus without complication ([place])    Discussed diabetes poor control.  Discussed need to get back on diet and have proper nutrition with weight loss for control.      Relevant Orders   Bayer DCA Hb A1c Waived   Basic metabolic panel   LP+ALT+AST [place], Waived     Other   Hyperlipidemia    Elevated trig diet      Relevant Orders   Bayer DCA Hb A1c Waived   Basic metabolic panel   LP+ALT+AST [place], [place]    Other Visit Diagnoses    Need for Tdap vaccination       Relevant Orders   Td : Tetanus/diphtheria >7yo Preservative  free (Completed)       Follow up plan: Return in about 3 months (around 08/04/2017) for Hemoglobin A1c.

## 2017-05-06 ENCOUNTER — Encounter: Payer: Self-pay | Admitting: Family Medicine

## 2017-05-06 LAB — BASIC METABOLIC PANEL
BUN/Creatinine Ratio: 18 (ref 9–20)
BUN: 14 mg/dL (ref 6–24)
CALCIUM: 9.3 mg/dL (ref 8.7–10.2)
CHLORIDE: 103 mmol/L (ref 96–106)
CO2: 22 mmol/L (ref 20–29)
Creatinine, Ser: 0.8 mg/dL (ref 0.76–1.27)
GFR calc non Af Amer: 101 mL/min/{1.73_m2} (ref >59)
GFR, EST AFRICAN AMERICAN: 117 mL/min/{1.73_m2} (ref >59)
Glucose: 216 mg/dL — ABNORMAL HIGH (ref 65–99)
Potassium: 4.4 mmol/L (ref 3.5–5.2)
Sodium: 141 mmol/L (ref 134–144)

## 2017-05-06 LAB — LP+ALT+AST PICCOLO, WAIVED
ALT (SGPT) PICCOLO, WAIVED: 40 U/L (ref 10–47)
AST (SGOT) Piccolo, Waived: 26 U/L (ref 11–38)
CHOLESTEROL PICCOLO, WAIVED: 191 mg/dL (ref <200)
Chol/HDL Ratio Piccolo,Waive: 4.7 mg/dL
HDL CHOL PICCOLO, WAIVED: 40 mg/dL — AB (ref >59)
LDL CHOL CALC PICCOLO WAIVED: 81 mg/dL (ref <100)
Triglycerides Piccolo,Waived: 350 mg/dL — ABNORMAL HIGH (ref <150)
VLDL Chol Calc Piccolo,Waive: 70 mg/dL — ABNORMAL HIGH (ref <30)

## 2017-05-06 LAB — BAYER DCA HB A1C WAIVED: HB A1C: 7.4 % — AB (ref <7.0)

## 2017-06-30 ENCOUNTER — Encounter: Payer: Self-pay | Admitting: Family Medicine

## 2017-08-11 ENCOUNTER — Other Ambulatory Visit: Payer: Self-pay

## 2017-08-11 ENCOUNTER — Emergency Department: Payer: BLUE CROSS/BLUE SHIELD

## 2017-08-11 ENCOUNTER — Encounter: Payer: Self-pay | Admitting: Intensive Care

## 2017-08-11 ENCOUNTER — Observation Stay
Admission: EM | Admit: 2017-08-11 | Discharge: 2017-08-13 | Disposition: A | Discharge status: Home / Self Care | Payer: BLUE CROSS/BLUE SHIELD | Attending: Internal Medicine | Admitting: Internal Medicine

## 2017-08-11 DIAGNOSIS — K921 Melena: Secondary | ICD-10-CM | POA: Diagnosis not present

## 2017-08-11 DIAGNOSIS — K625 Hemorrhage of anus and rectum: Secondary | ICD-10-CM

## 2017-08-11 DIAGNOSIS — M5136 Other intervertebral disc degeneration, lumbar region: Secondary | ICD-10-CM | POA: Diagnosis not present

## 2017-08-11 DIAGNOSIS — E119 Type 2 diabetes mellitus without complications: Secondary | ICD-10-CM | POA: Diagnosis not present

## 2017-08-11 DIAGNOSIS — K5730 Diverticulosis of large intestine without perforation or abscess without bleeding: Secondary | ICD-10-CM | POA: Diagnosis not present

## 2017-08-11 DIAGNOSIS — N281 Cyst of kidney, acquired: Secondary | ICD-10-CM | POA: Insufficient documentation

## 2017-08-11 DIAGNOSIS — Z7982 Long term (current) use of aspirin: Secondary | ICD-10-CM | POA: Diagnosis not present

## 2017-08-11 DIAGNOSIS — I10 Essential (primary) hypertension: Secondary | ICD-10-CM | POA: Insufficient documentation

## 2017-08-11 DIAGNOSIS — K760 Fatty (change of) liver, not elsewhere classified: Secondary | ICD-10-CM | POA: Diagnosis not present

## 2017-08-11 DIAGNOSIS — Z79899 Other long term (current) drug therapy: Secondary | ICD-10-CM | POA: Diagnosis not present

## 2017-08-11 DIAGNOSIS — K922 Gastrointestinal hemorrhage, unspecified: Secondary | ICD-10-CM | POA: Diagnosis present

## 2017-08-11 DIAGNOSIS — E785 Hyperlipidemia, unspecified: Secondary | ICD-10-CM | POA: Insufficient documentation

## 2017-08-11 DIAGNOSIS — Z7984 Long term (current) use of oral hypoglycemic drugs: Secondary | ICD-10-CM | POA: Insufficient documentation

## 2017-08-11 DIAGNOSIS — Z880 Allergy status to penicillin: Secondary | ICD-10-CM | POA: Diagnosis not present

## 2017-08-11 DIAGNOSIS — M5137 Other intervertebral disc degeneration, lumbosacral region: Secondary | ICD-10-CM | POA: Insufficient documentation

## 2017-08-11 DIAGNOSIS — R55 Syncope and collapse: Secondary | ICD-10-CM | POA: Diagnosis not present

## 2017-08-11 LAB — CBC
HCT: 43.6 % (ref 40.0–52.0)
Hemoglobin: 15.1 g/dL (ref 13.0–18.0)
MCH: 32.6 pg (ref 26.0–34.0)
MCHC: 34.8 g/dL (ref 32.0–36.0)
MCV: 93.8 fL (ref 80.0–100.0)
PLATELETS: 219 10*3/uL (ref 150–440)
RBC: 4.65 MIL/uL (ref 4.40–5.90)
RDW: 12.7 % (ref 11.5–14.5)
WBC: 9.1 10*3/uL (ref 3.8–10.6)

## 2017-08-11 LAB — COMPREHENSIVE METABOLIC PANEL
ALK PHOS: 47 U/L (ref 38–126)
ALT: 25 U/L (ref 0–44)
AST: 23 U/L (ref 15–41)
Albumin: 3.9 g/dL (ref 3.5–5.0)
Anion gap: 8 (ref 5–15)
BUN: 17 mg/dL (ref 6–20)
CALCIUM: 8.7 mg/dL — AB (ref 8.9–10.3)
CO2: 25 mmol/L (ref 22–32)
CREATININE: 0.74 mg/dL (ref 0.61–1.24)
Chloride: 106 mmol/L (ref 98–111)
Glucose, Bld: 220 mg/dL — ABNORMAL HIGH (ref 70–99)
Potassium: 4.7 mmol/L (ref 3.5–5.1)
SODIUM: 139 mmol/L (ref 135–145)
Total Bilirubin: 0.6 mg/dL (ref 0.3–1.2)
Total Protein: 6.7 g/dL (ref 6.5–8.1)

## 2017-08-11 LAB — GLUCOSE, CAPILLARY: GLUCOSE-CAPILLARY: 206 mg/dL — AB (ref 70–99)

## 2017-08-11 LAB — HEMOGLOBIN: HEMOGLOBIN: 14.4 g/dL (ref 13.0–18.0)

## 2017-08-11 MED ORDER — SIMVASTATIN 20 MG PO TABS
40.0000 mg | ORAL_TABLET | Freq: Every day | ORAL | Status: DC
  Administered 2017-08-12 – 2017-08-13 (×2): 40 mg via ORAL
  Filled 2017-08-11 (×2): qty 2

## 2017-08-11 MED ORDER — IOPAMIDOL (ISOVUE-370) INJECTION 76%
100.0000 mL | Freq: Once | INTRAVENOUS | Status: AC | PRN
  Administered 2017-08-11: 100 mL via INTRAVENOUS

## 2017-08-11 MED ORDER — BISACODYL 5 MG PO TBEC: 5.0000 mg | DELAYED_RELEASE_TABLET | Freq: Every day | ORAL | Status: DC | PRN

## 2017-08-11 MED ORDER — SODIUM CHLORIDE 0.9 % IV SOLN
8.0000 mg/h | INTRAVENOUS | Status: DC
  Administered 2017-08-11 – 2017-08-12 (×2): 8 mg/h via INTRAVENOUS
  Filled 2017-08-11 (×2): qty 80

## 2017-08-11 MED ORDER — INSULIN ASPART 100 UNIT/ML ~~LOC~~ SOLN
0.0000 [IU] | Freq: Three times a day (TID) | SUBCUTANEOUS | Status: DC
  Administered 2017-08-12 (×2): 1 [IU] via SUBCUTANEOUS
  Administered 2017-08-12: 13:00:00 5 [IU] via SUBCUTANEOUS
  Administered 2017-08-13: 2 [IU] via SUBCUTANEOUS
  Filled 2017-08-11 (×4): qty 1

## 2017-08-11 MED ORDER — SODIUM CHLORIDE 0.9 % IV SOLN
80.0000 mg | Freq: Once | INTRAVENOUS | Status: AC
  Administered 2017-08-11: 80 mg via INTRAVENOUS
  Filled 2017-08-11: qty 80

## 2017-08-11 MED ORDER — ONDANSETRON HCL 4 MG/2ML IJ SOLN: 4.0000 mg | Freq: Four times a day (QID) | INTRAMUSCULAR | Status: DC | PRN

## 2017-08-11 MED ORDER — HYDROCODONE-ACETAMINOPHEN 5-325 MG PO TABS: 1.0000 | ORAL_TABLET | ORAL | Status: DC | PRN

## 2017-08-11 MED ORDER — MELATONIN 5 MG PO TABS
5.0000 mg | ORAL_TABLET | Freq: Every evening | ORAL | Status: DC | PRN
  Administered 2017-08-11 – 2017-08-12 (×2): 5 mg via ORAL
  Filled 2017-08-11 (×3): qty 1

## 2017-08-11 MED ORDER — ALBUTEROL SULFATE (2.5 MG/3ML) 0.083% IN NEBU: 2.5000 mg | INHALATION_SOLUTION | RESPIRATORY_TRACT | Status: DC | PRN

## 2017-08-11 MED ORDER — INSULIN ASPART 100 UNIT/ML ~~LOC~~ SOLN
0.0000 [IU] | Freq: Every day | SUBCUTANEOUS | Status: DC
  Administered 2017-08-11: 2 [IU] via SUBCUTANEOUS
  Filled 2017-08-11: qty 1

## 2017-08-11 MED ORDER — SENNOSIDES-DOCUSATE SODIUM 8.6-50 MG PO TABS: 1.0000 | ORAL_TABLET | Freq: Every evening | ORAL | Status: DC | PRN

## 2017-08-11 MED ORDER — ONDANSETRON HCL 4 MG PO TABS: 4.0000 mg | ORAL_TABLET | Freq: Four times a day (QID) | ORAL | Status: DC | PRN

## 2017-08-11 MED ORDER — ACETAMINOPHEN 650 MG RE SUPP: 650.0000 mg | Freq: Four times a day (QID) | RECTAL | Status: DC | PRN

## 2017-08-11 MED ORDER — SODIUM CHLORIDE 0.9 % IV SOLN
INTRAVENOUS | Status: DC
  Administered 2017-08-11 – 2017-08-13 (×4): via INTRAVENOUS

## 2017-08-11 MED ORDER — ACETAMINOPHEN 325 MG PO TABS: 650.0000 mg | ORAL_TABLET | Freq: Four times a day (QID) | ORAL | Status: DC | PRN

## 2017-08-11 NOTE — ED Notes (Signed)
Patient transported to 101 

## 2017-08-11 NOTE — Progress Notes (Signed)
Patient requested melatonin for sleep. See new orders. Also updated NPO order per MD instructions to include except sips w/ meds.

## 2017-08-11 NOTE — ED Triage Notes (Signed)
Patient states "I was walking to punch out of work and felt like I had, had diarrhea and went to the bathroom and I had blood all in my underwear. When I got home, I have pains in my abdomen and when I go to the bathroom blood just comes out" A&O x4. Reports 4-5 episodes since 1pm

## 2017-08-11 NOTE — ED Provider Notes (Signed)
[facility] Emergency Department Provider Note    First MD Initiated Contact with Patient 08/11/17 1920     (approximate)  I have reviewed the triage vital signs and the nursing notes.   HISTORY  Chief Complaint BRBPR   HPI KELCY BAETEN is a 52 y.o. male history of hypertension diabetes hyperlipidemia occasional alcohol use presents to the ER for evaluation of bright red blood per rectum that started today while on break at lunch from work.  Started noticing bright red blood in the toilet.  Then had 2 other episodes where he felt like he lost control of his bowels at work.  He has never had symptoms like this before.  States that over the past few days he thinks he has noted some darker colored stools.  Not on any blood thinners.  States he has been taking Motrin several times a day for the past several weeks due to back pain.    Past Medical History:  Diagnosis Date  . Diabetes mellitus without complication ([place])   . Hyperlipidemia   . Hypertension    Family History  Problem Relation Age of Onset  . Hypertension Mother   . Diabetes Mother   . Hypertension Father   . Cancer Father    Past Surgical History:  Procedure Laterality Date  . PLEURAL SCARIFICATION     Patient Active Problem List   Diagnosis Date Noted  . GIB (gastrointestinal bleeding) 08/11/2017  . Shoulder pain, left 04/24/2016  . Diabetes mellitus without complication ([place])   . Hyperlipidemia   . Hypertension       Prior to Admission medications   Medication Sig Start Date End Date Taking? Authorizing Provider  aspirin EC 81 MG tablet Take 81 mg by mouth daily.   Yes [provider]  benazepril (LOTENSIN) 40 MG tablet Take 1 tablet (40 mg total) by mouth daily. 10/31/16  Yes Crissman, Jeannette How, MD  Dapagliflozin-metFORMIN HCl ER (XIGDUO XR) 05-998 MG TB24 Take 1 tablet by mouth 2 (two) times daily. 03/17/17  Yes Johnson, Megan P, DO  Exenatide ER (BYDUREON) 2 MG PEN  INJECT 1 PEN (2MG ) SUBCUTANEOUSLY ONCE WEEKLY. 10/31/16  Yes Crissman, Jeannette How, MD  glipiZIDE (GLUCOTROL) 5 MG tablet Take 5 mg by mouth daily as needed (high blood sugar).   Yes [provider]  ONE TOUCH ULTRA TEST test strip USE AS DIRECTED. NEED NEW INSURANCE. 05/03/15  Yes Crissman, Jeannette How, MD  simvastatin (ZOCOR) 40 MG tablet Take 1 tablet (40 mg total) by mouth daily. 10/31/16  Yes Guadalupe Maple, MD    Allergies Penicillins    Social History Social History   Tobacco Use  . Smoking status: Never Smoker  . Smokeless tobacco: Never Used  Substance Use Topics  . Alcohol use: Yes    Comment: occasional  . Drug use: No    Review of Systems Patient denies headaches, rhinorrhea, blurry vision, numbness, shortness of breath, chest pain, edema, cough, abdominal pain, nausea, vomiting, diarrhea, dysuria, fevers, rashes or hallucinations unless otherwise stated above in HPI. ____________________________________________   PHYSICAL EXAM:  VITAL SIGNS: Vitals:   08/11/17 2100 08/11/17 2141  BP: 106/65 115/71  Pulse: 94 95  Resp:  (!) 21  Temp:  98.3 F (36.8 C)  SpO2: 99% 97%    Constitutional: Alert and oriented.  Eyes: Conjunctivae are normal.  Head: Atraumatic. Nose: No congestion/rhinnorhea. Mouth/Throat: Mucous membranes are moist.   Neck: No stridor. Painless ROM.  Cardiovascular: Normal  rate, regular rhythm. Grossly normal heart sounds.  Good peripheral circulation. Respiratory: Normal respiratory effort.  No retractions. Lungs CTAB. Gastrointestinal: Soft and nontender. No distention. No abdominal bruits. No CVA tenderness. Genitourinary: No hemorrhoids.  Does have bright red blood per rectum and dark melanotic stool. Musculoskeletal: No lower extremity tenderness nor edema.  No joint effusions. Neurologic:  Normal speech and language. No gross focal neurologic deficits are appreciated. No facial droop Skin:  Skin is warm, dry and intact. No rash  noted. Psychiatric: Mood and affect are normal. Speech and behavior are normal.  ____________________________________________   LABS (all labs ordered are listed, but only abnormal results are displayed)  Results for orders placed or performed during the hospital encounter of 08/11/17 (from the past 24 hour(s))  Comprehensive metabolic panel     Status: Abnormal   Collection Time: 08/11/17  6:38 PM  Result Value Ref Range   Sodium 139 135 - 145 mmol/L   Potassium 4.7 3.5 - 5.1 mmol/L   Chloride 106 98 - 111 mmol/L   CO2 25 22 - 32 mmol/L   Glucose, Bld 220 (H) 70 - 99 mg/dL   BUN 17 6 - 20 mg/dL   Creatinine, Ser 0.74 0.61 - 1.24 mg/dL   Calcium 8.7 (L) 8.9 - 10.3 mg/dL   Total Protein 6.7 6.5 - 8.1 g/dL   Albumin 3.9 3.5 - 5.0 g/dL   AST 23 15 - 41 U/L   ALT 25 0 - 44 U/L   Alkaline Phosphatase 47 38 - 126 U/L   Total Bilirubin 0.6 0.3 - 1.2 mg/dL   GFR calc non Af Amer >60 >60 mL/min   GFR calc Af Amer >60 >60 mL/min   Anion gap 8 5 - 15  CBC     Status: None   Collection Time: 08/11/17  6:38 PM  Result Value Ref Range   WBC 9.1 3.8 - 10.6 K/uL   RBC 4.65 4.40 - 5.90 MIL/uL   Hemoglobin 15.1 13.0 - 18.0 g/dL   HCT 43.6 40.0 - 52.0 %   MCV 93.8 80.0 - 100.0 fL   MCH 32.6 26.0 - 34.0 pg   MCHC 34.8 32.0 - 36.0 g/dL   RDW 12.7 11.5 - 14.5 %   Platelets 219 150 - 440 K/uL  Type and screen [place]     Status: None   Collection Time: 08/11/17  6:38 PM  Result Value Ref Range   ABO/RH(D) O NEG    Antibody Screen NEG    Sample Expiration      08/14/2017 Performed at [facility], [address] [place], [place] [address]   Hemoglobin     Status: None   Collection Time: 08/11/17  9:36 PM  Result Value Ref Range   Hemoglobin 14.4 13.0 - 18.0 g/dL  Glucose, capillary     Status: Abnormal   Collection Time: 08/11/17 10:00 PM  Result Value Ref Range   Glucose-Capillary 206 (H) 70 - 99 mg/dL    ____________________________________________  EKG My review and personal interpretation at Time: 19:19   Indication: gi bleed  Rate: 95  Rhythm: sinus Axis: normal Other: normal intervals, no stemi ____________________________________________  RADIOLOGY  I personally reviewed all radiographic images ordered to evaluate for the above acute complaints and reviewed radiology reports and findings.  These findings were personally discussed with the patient.  Please see medical record for radiology report.  ____________________________________________   PROCEDURES  Procedure(s) performed:  Procedures  Critical Care performed: no ____________________________________________   INITIAL IMPRESSION / ASSESSMENT AND PLAN / ED COURSE  Pertinent labs & imaging results that were available during my care of the patient were reviewed by me and considered in my medical decision making (see chart for details).   DDX: Upper GI bleed, lower GI bleed, diverticulitis, diverticulosis, AAA, anal fissure, hemorrhoids  PRINCETON NABOR is a 52 y.o. who presents to the ED with presentation as described above.  Clearly has evidence of bright red blood per rectum.  No evidence of hemorrhoids.  Based on history of hypertension and low back pain for the past several weeks will order CT angiogram to exclude AAA rater.  Entero-fistula.  Based on his NSAID use I do have a high suspicion for at the minimum PUD and will start on IV Protonix.  Fortunately as his hemoglobin is stable at this time.  Anticipate patient will require hospitalization.  Clinical Course as of Aug 12 2323  Mon Aug 11, 2017  2036 CT does not show any evidence of AAA.  Patient still having hematochezia.  Patient will require hospitalization for continued IV Protonix, hemoglobins and hemodynamic monitoring.   [PR]    Clinical Course User Index [PR] Merlyn Lot, MD     As part of my medical decision making, I reviewed the following  data within the [place] notes reviewed and incorporated, Labs reviewed, notes from prior ED visit.   ____________________________________________   FINAL CLINICAL IMPRESSION(S) / ED DIAGNOSES  Final diagnoses:  Gastrointestinal hemorrhage, unspecified gastrointestinal hemorrhage type  BRBPR (bright red blood per rectum)      NEW MEDICATIONS STARTED DURING THIS VISIT:  Current Discharge Medication List       Note:  This document was prepared using Dragon voice recognition software and may include unintentional dictation errors.    Merlyn Lot, MD 08/11/17 2325

## 2017-08-11 NOTE — H&P (Addendum)
[facility] [place] NAME: Matthew Collier    MR#:  [id number]  DATE OF BIRTH:  1965/04/22  DATE OF ADMISSION:  08/11/2017  PRIMARY CARE PHYSICIAN: Guadalupe Maple, MD   REQUESTING/REFERRING PHYSICIAN: Dr. Quentin Cornwall.  CHIEF COMPLAINT:  No chief complaint on file.  Rectal bleeding today. HISTORY OF PRESENT ILLNESS:  Matthew Collier  is a 52 y.o. male with a known history of hypertension, diabetes, hyperlipidemia.  The patient presents the ED with rectal bleeding today.  He denies any abdominal pain, nausea, vomiting or diarrhea.  He mentioned that he had intermittent melena for the past couple weeks.  He took NSAIDs 3-4 times a week for long time. He felt sweating and the passed out twice in the ED.  Hemoglobin is 15.1. PAST MEDICAL HISTORY:   Past Medical History:  Diagnosis Date  . Diabetes mellitus without complication ([place])   . Hyperlipidemia   . Hypertension     PAST SURGICAL HISTORY:   Past Surgical History:  Procedure Laterality Date  . PLEURAL SCARIFICATION      SOCIAL HISTORY:   Social History   Tobacco Use  . Smoking status: Never Smoker  . Smokeless tobacco: Never Used  Substance Use Topics  . Alcohol use: Yes    Comment: occasional    FAMILY HISTORY:   Family History  Problem Relation Age of Onset  . Hypertension Mother   . Diabetes Mother   . Hypertension Father   . Cancer Father     DRUG ALLERGIES:   Allergies  Allergen Reactions  . Penicillins     REVIEW OF SYSTEMS:   Review of Systems  Constitutional: Positive for malaise/fatigue. Negative for chills and fever.  HENT: Negative for sore throat.   Eyes: Negative for blurred vision and double vision.  Respiratory: Negative for cough, hemoptysis, shortness of breath, wheezing and stridor.   Cardiovascular: Negative for chest pain, palpitations, orthopnea and leg swelling.  Gastrointestinal: Positive for blood in stool and melena. Negative for abdominal  pain, diarrhea, nausea and vomiting.  Genitourinary: Negative for dysuria, flank pain and hematuria.  Musculoskeletal: Negative for back pain and joint pain.  Skin: Negative for rash.  Neurological: Positive for loss of consciousness. Negative for dizziness, sensory change, focal weakness, seizures, weakness and headaches.  Endo/Heme/Allergies: Negative for polydipsia.  Psychiatric/Behavioral: Negative for depression. The patient is not nervous/anxious.     MEDICATIONS AT HOME:   Prior to Admission medications   Medication Sig Start Date End Date Taking? Authorizing Provider  aspirin EC 81 MG tablet Take 81 mg by mouth daily.   Yes [provider]  benazepril (LOTENSIN) 40 MG tablet Take 1 tablet (40 mg total) by mouth daily. 10/31/16  Yes Crissman, Jeannette How, MD  Dapagliflozin-metFORMIN HCl ER (XIGDUO XR) 05-998 MG TB24 Take 1 tablet by mouth 2 (two) times daily. 03/17/17  Yes Johnson, Megan P, DO  Exenatide ER (BYDUREON) 2 MG PEN INJECT 1 PEN (2MG ) SUBCUTANEOUSLY ONCE WEEKLY. 10/31/16  Yes Crissman, Jeannette How, MD  glipiZIDE (GLUCOTROL) 5 MG tablet Take 5 mg by mouth daily as needed (high blood sugar).   Yes [provider]  ONE TOUCH ULTRA TEST test strip USE AS DIRECTED. NEED NEW INSURANCE. 05/03/15  Yes Crissman, Jeannette How, MD  simvastatin (ZOCOR) 40 MG tablet Take 1 tablet (40 mg total) by mouth daily. 10/31/16  Yes Crissman, Jeannette How, MD      VITAL SIGNS:  Blood pressure 106/65, pulse 94,  temperature 98.6 F (37 C), temperature source Oral, resp. rate 16, height 5\' 10"  (1.778 m), weight 235 lb (106.6 kg), SpO2 99 %.  PHYSICAL EXAMINATION:  Physical Exam  GENERAL:  52 y.o.-year-old patient lying in the bed with no acute distress.  Obesity. EYES: Pupils equal, round, reactive to light and accommodation. No scleral icterus. Extraocular muscles intact.  HEENT: Head atraumatic, normocephalic. Oropharynx and nasopharynx clear.  NECK:  Supple, no jugular venous distention. No  thyroid enlargement, no tenderness.  LUNGS: Normal breath sounds bilaterally, no wheezing, rales,rhonchi or crepitation. No use of accessory muscles of respiration.  CARDIOVASCULAR: S1, S2 normal. No murmurs, rubs, or gallops.  ABDOMEN: Soft, mild tenderness in epigastric area, nondistended. Bowel sounds present. No organomegaly or mass.  EXTREMITIES: No pedal edema, cyanosis, or clubbing.  NEUROLOGIC: Cranial nerves II through XII are intact. Muscle strength 5/5 in all extremities. Sensation intact. Gait not checked.  PSYCHIATRIC: The patient is alert and oriented x 3.  SKIN: No obvious rash, lesion, or ulcer.   LABORATORY PANEL:   CBC Recent Labs  Lab 08/11/17 1838  WBC 9.1  HGB 15.1  HCT 43.6  PLT 219   ------------------------------------------------------------------------------------------------------------------  Chemistries  Recent Labs  Lab 08/11/17 1838  NA 139  K 4.7  CL 106  CO2 25  GLUCOSE 220*  BUN 17  CREATININE 0.74  CALCIUM 8.7*  AST 23  ALT 25  ALKPHOS 47  BILITOT 0.6   ------------------------------------------------------------------------------------------------------------------  Cardiac Enzymes No results for input(s): TROPONINI in the last 168 hours. ------------------------------------------------------------------------------------------------------------------  RADIOLOGY:  Ct Angio Abd/pel W And/or Wo Contrast  Result Date: 08/11/2017 CLINICAL DATA:  Lower gastrointestinal bleeding EXAM: CTA ABDOMEN AND PELVIS wITHOUT AND WITH CONTRAST TECHNIQUE: Multidetector CT imaging of the abdomen and pelvis was performed using the standard protocol during bolus administration of intravenous contrast. Multiplanar reconstructed images and MIPs were obtained and reviewed to evaluate the vascular anatomy. CONTRAST:  157mL ISOVUE-370 IOPAMIDOL (ISOVUE-370) INJECTION 76% COMPARISON:  None. FINDINGS: VASCULAR Aorta: Nonaneurysmal and patent. Celiac: Patent.   Branch vessels patent. SMA: Patent. Renals: 2 right and 2 left renal arteries are widely patent. IMA: Patent. Inflow: Bilateral common, internal, and external iliac arteries are patent. Proximal Outflow: Grossly patent. Veins: There is no evidence of DVT. Review of the MIP images confirms the above findings. NON-VASCULAR Lower chest: Left ventricle myocardial hypertrophy is present. Subsegmental atelectasis at the base of the anterior right lower lobe. Hepatobiliary: Diffuse hepatic steatosis.  Unremarkable gallbladder. Pancreas: Unremarkable Spleen: Unremarkable Adrenals/Urinary Tract: Simple cyst in the mid mid right kidney. Left kidney contains a tiny cyst. Adrenal glands are unremarkable. Bladder is unremarkable. Stomach/Bowel: There is no convincing focus of arterial phase contrast extravasation with pooling on venous phase images to suggest active gastrointestinal bleeding. There are foci of high density areas within the stool of the distal sigmoid colon which are stable between arterial and venous phase. This is seen in other parts of the colon and likely reflects hyperdense stool. Colonic diverticulosis affects the descending and sigmoid colon, and to a lesser degree, the transverse colon. There is one diverticulum at the level of the hepatic flexure which is ill-defined. There is stranding in the adjacent fat. There is no extraluminal bowel gas or abscess. These findings raise the possibility of acute diverticulitis at the hepatic flexure. Stomach is decompressed. No evidence of small-bowel obstruction. Lymphatic: There is no abnormal retroperitoneal adenopathy. Reproductive: Prostate is within normal limits. Other: No free fluid Musculoskeletal: No vertebral compression deformity. There is degenerative  disc disease and facet arthropathy at L4-5 and L5-S1. IMPRESSION: VASCULAR No convincing evidence of active gastrointestinal bleeding. NON-VASCULAR Findings above suggest acute diverticulitis at the hepatic  flexure. This is characterized by haziness of the wall of the diverticulum and stranding in the adjacent fat. There is no extraluminal bowel gas or abscess. There is no evidence of gastrointestinal bleeding at this location. Electronically Signed   By: Marybelle Killings M.D.   On: 08/11/2017 21:02      IMPRESSION AND PLAN:   GI bleeding with melena and bloody stool. The patient will be placed for observation. N.p.o. expect medication, IV fluid support, Protonix IV, follow-up hemoglobin and GI consult.  Syncope.  Possible due to GI bleeding.  Hypertension.  Hold lisinopril due to low side blood pressure.  Diabetes.  Start sliding scale.  Hold glipizide.   All the records are reviewed and case discussed with ED provider. Management plans discussed with the patient, his wife and they are in agreement.  CODE STATUS: Full code.  TOTAL TIME TAKING CARE OF THIS PATIENT: 41 minutes.    Demetrios Loll M.D on 08/11/2017 at 9:17 PM  Between 7am to 6pm - Pager - [phone]  After 6pm go to [email or web address] - Proofreader  [facility]  Office  [phone]  CC: Primary care physician; Guadalupe Maple, MD   Note: This dictation was prepared with Dragon dictation along with smaller phrase technology. Any transcriptional errors that result from this process are unin

## 2017-08-12 ENCOUNTER — Ambulatory Visit: Payer: BLUE CROSS/BLUE SHIELD | Admitting: Family Medicine

## 2017-08-12 DIAGNOSIS — K625 Hemorrhage of anus and rectum: Secondary | ICD-10-CM | POA: Diagnosis not present

## 2017-08-12 DIAGNOSIS — K922 Gastrointestinal hemorrhage, unspecified: Secondary | ICD-10-CM | POA: Diagnosis not present

## 2017-08-12 DIAGNOSIS — I10 Essential (primary) hypertension: Secondary | ICD-10-CM | POA: Diagnosis not present

## 2017-08-12 DIAGNOSIS — E119 Type 2 diabetes mellitus without complications: Secondary | ICD-10-CM | POA: Diagnosis not present

## 2017-08-12 DIAGNOSIS — K5732 Diverticulitis of large intestine without perforation or abscess without bleeding: Secondary | ICD-10-CM | POA: Diagnosis not present

## 2017-08-12 DIAGNOSIS — R55 Syncope and collapse: Secondary | ICD-10-CM | POA: Diagnosis not present

## 2017-08-12 LAB — CBC
HCT: 36.8 % — ABNORMAL LOW (ref 40.0–52.0)
Hemoglobin: 12.9 g/dL — ABNORMAL LOW (ref 13.0–18.0)
MCH: 32.8 pg (ref 26.0–34.0)
MCHC: 35.1 g/dL (ref 32.0–36.0)
MCV: 93.3 fL (ref 80.0–100.0)
Platelets: 174 10*3/uL (ref 150–440)
RBC: 3.94 MIL/uL — ABNORMAL LOW (ref 4.40–5.90)
RDW: 12.7 % (ref 11.5–14.5)
WBC: 9.4 10*3/uL (ref 3.8–10.6)

## 2017-08-12 LAB — BASIC METABOLIC PANEL
Anion gap: 6 (ref 5–15)
BUN: 16 mg/dL (ref 6–20)
CHLORIDE: 108 mmol/L (ref 98–111)
CO2: 26 mmol/L (ref 22–32)
Calcium: 8.2 mg/dL — ABNORMAL LOW (ref 8.9–10.3)
Creatinine, Ser: 0.78 mg/dL (ref 0.61–1.24)
GFR calc Af Amer: >60 mL/min (ref >60)
GLUCOSE: 160 mg/dL — AB (ref 70–99)
Potassium: 4.3 mmol/L (ref 3.5–5.1)
Sodium: 140 mmol/L (ref 135–145)

## 2017-08-12 LAB — GLUCOSE, CAPILLARY
Glucose-Capillary: 136 mg/dL — ABNORMAL HIGH (ref 70–99)
Glucose-Capillary: 274 mg/dL — ABNORMAL HIGH (ref 70–99)
Glucose-Capillary: 138 mg/dL — ABNORMAL HIGH (ref 70–99)
Glucose-Capillary: 149 mg/dL — ABNORMAL HIGH (ref 70–99)

## 2017-08-12 LAB — HEMOGLOBIN: Hemoglobin: 12.5 g/dL — ABNORMAL LOW (ref 13.0–18.0)

## 2017-08-12 MED ORDER — METRONIDAZOLE 500 MG PO TABS
500.0000 mg | ORAL_TABLET | Freq: Three times a day (TID) | ORAL | Status: DC
  Administered 2017-08-12 – 2017-08-13 (×3): 500 mg via ORAL
  Filled 2017-08-12 (×3): qty 1

## 2017-08-12 MED ORDER — PANTOPRAZOLE SODIUM 40 MG PO TBEC
40.0000 mg | DELAYED_RELEASE_TABLET | Freq: Two times a day (BID) | ORAL | Status: DC
  Administered 2017-08-12 – 2017-08-13 (×2): 40 mg via ORAL
  Filled 2017-08-12 (×2): qty 1

## 2017-08-12 MED ORDER — CIPROFLOXACIN IN D5W 400 MG/200ML IV SOLN
400.0000 mg | Freq: Two times a day (BID) | INTRAVENOUS | Status: DC
  Administered 2017-08-12 (×2): 400 mg via INTRAVENOUS
  Filled 2017-08-12 (×3): qty 200

## 2017-08-12 NOTE — Consult Note (Signed)
Vonda Antigua, MD [address], [place], [place], [place], [address] [address], [place], [place], [place], [address] Phone: [phone]  Fax: [phone]  Consultation  Referring Provider:     Dr. Bridgett Larsson Primary Care Physician:  Guadalupe Maple, MD Reason for Consultation:     Hematochezia  Date of Admission:  08/11/2017 Date of Consultation:  08/12/2017         HPI:   Matthew Collier is a 52 y.o. male who presents with 1 day history of multiple bowel movements with red blood.  Last bowel movement was at 2 or 3 in the morning, patient states that consisted of dark to bright red blood, but was much less in quantity than his previous bowel movements with hematochezia.  States he went to the bathroom at 7 in the morning today as well to have a bowel movement, but nothing came out, and when he wiped with the tissue paper he saw some bright red blood in the tissue paper.  No prior history of similar symptoms.  Reports some abdominal cramping when symptoms started, bilateral lower quadrant abdominal pain, 4/10, cramping, nonradiating, that has resolved at this time.  No prior colonoscopy or EGD.  No family history of colon cancer.  Uses aspirin daily, but no other NSAID use.  No new medications.  Past Medical History:  Diagnosis Date  . Diabetes mellitus without complication ([place])   . Hyperlipidemia   . Hypertension     Past Surgical History:  Procedure Laterality Date  . PLEURAL SCARIFICATION      Prior to Admission medications   Medication Sig Start Date End Date Taking? Authorizing Provider  aspirin EC 81 MG tablet Take 81 mg by mouth daily.   Yes [provider]  benazepril (LOTENSIN) 40 MG tablet Take 1 tablet (40 mg total) by mouth daily. 10/31/16  Yes Crissman, Jeannette How, MD  Dapagliflozin-metFORMIN HCl ER (XIGDUO XR) 05-998 MG TB24 Take 1 tablet by mouth 2 (two) times daily. 03/17/17  Yes Johnson, Megan P, DO  Exenatide ER (BYDUREON) 2 MG PEN INJECT 1 PEN (2MG )  SUBCUTANEOUSLY ONCE WEEKLY. 10/31/16  Yes Crissman, Jeannette How, MD  glipiZIDE (GLUCOTROL) 5 MG tablet Take 5 mg by mouth daily as needed (high blood sugar).   Yes [provider]  ONE TOUCH ULTRA TEST test strip USE AS DIRECTED. NEED NEW INSURANCE. 05/03/15  Yes Crissman, Jeannette How, MD  simvastatin (ZOCOR) 40 MG tablet Take 1 tablet (40 mg total) by mouth daily. 10/31/16  Yes Guadalupe Maple, MD    Family History  Problem Relation Age of Onset  . Hypertension Mother   . Diabetes Mother   . Hypertension Father   . Cancer Father      Social History   Tobacco Use  . Smoking status: Never Smoker  . Smokeless tobacco: Never Used  Substance Use Topics  . Alcohol use: Yes    Comment: occasional  . Drug use: No    Allergies as of 08/11/2017 - Review Complete 08/11/2017  Allergen Reaction Noted  . Penicillins  09/02/2014    Review of Systems:    All systems reviewed and negative except where noted in HPI.   Physical Exam:  Vital signs in last 24 hours: Vitals:   08/11/17 2000 08/11/17 2100 08/11/17 2141 08/12/17 0541  BP: 99/67 106/65 115/71 104/70  Pulse: 89 94 95 77  Resp:   (!) 21 16  Temp:   98.3 F (36.8 C) 97.7 F (  36.5 C)  TempSrc:   Oral Oral  SpO2: 96% 99% 97% 95%  Weight:   226 lb 11.2 oz (102.8 kg)   Height:   5\' 10"  (1.778 m)    Last BM Date: 08/11/17 General:   Pleasant, cooperative in NAD Head:  Normocephalic and atraumatic. Eyes:   No icterus.   Conjunctiva pink. PERRLA. Ears:  Normal auditory acuity. Neck:  Supple; no masses or thyroidomegaly Lungs: Respirations even and unlabored. Lungs clear to auscultation bilaterally.   No wheezes, crackles, or rhonchi.  Abdomen:  Soft, nondistended, nontender. Normal bowel sounds. No appreciable masses or hepatomegaly.  No rebound or guarding.  Neurologic:  Alert and oriented x3;  grossly normal neurologically. Skin:  Intact without significant lesions or rashes. Cervical Nodes:  No significant cervical  adenopathy. Psych:  Alert and cooperative. Normal affect.  LAB RESULTS: Recent Labs    08/11/17 1838 08/11/17 2136 08/12/17 0305  WBC 9.1  --  9.4  HGB 15.1 14.4 12.9*  HCT 43.6  --  36.8*  PLT 219  --  174   BMET Recent Labs    08/11/17 1838 08/12/17 0305  NA 139 140  K 4.7 4.3  CL 106 108  CO2 25 26  GLUCOSE 220* 160*  BUN 17 16  CREATININE 0.74 0.78  CALCIUM 8.7* 8.2*   LFT Recent Labs    08/11/17 1838  PROT 6.7  ALBUMIN 3.9  AST 23  ALT 25  ALKPHOS 47  BILITOT 0.6   PT/INR No results for input(s): LABPROT, INR in the last 72 hours.  STUDIES: Ct Angio Abd/pel W And/or Wo Contrast  Result Date: 08/11/2017 CLINICAL DATA:  Lower gastrointestinal bleeding EXAM: CTA ABDOMEN AND PELVIS wITHOUT AND WITH CONTRAST TECHNIQUE: Multidetector CT imaging of the abdomen and pelvis was performed using the standard protocol during bolus administration of intravenous contrast. Multiplanar reconstructed images and MIPs were obtained and reviewed to evaluate the vascular anatomy. CONTRAST:  140mL ISOVUE-370 IOPAMIDOL (ISOVUE-370) INJECTION 76% COMPARISON:  None. FINDINGS: VASCULAR Aorta: Nonaneurysmal and patent. Celiac: Patent.  Branch vessels patent. SMA: Patent. Renals: 2 right and 2 left renal arteries are widely patent. IMA: Patent. Inflow: Bilateral common, internal, and external iliac arteries are patent. Proximal Outflow: Grossly patent. Veins: There is no evidence of DVT. Review of the MIP images confirms the above findings. NON-VASCULAR Lower chest: Left ventricle myocardial hypertrophy is present. Subsegmental atelectasis at the base of the anterior right lower lobe. Hepatobiliary: Diffuse hepatic steatosis.  Unremarkable gallbladder. Pancreas: Unremarkable Spleen: Unremarkable Adrenals/Urinary Tract: Simple cyst in the mid mid right kidney. Left kidney contains a tiny cyst. Adrenal glands are unremarkable. Bladder is unremarkable. Stomach/Bowel: There is no convincing focus of  arterial phase contrast extravasation with pooling on venous phase images to suggest active gastrointestinal bleeding. There are foci of high density areas within the stool of the distal sigmoid colon which are stable between arterial and venous phase. This is seen in other parts of the colon and likely reflects hyperdense stool. Colonic diverticulosis affects the descending and sigmoid colon, and to a lesser degree, the transverse colon. There is one diverticulum at the level of the hepatic flexure which is ill-defined. There is stranding in the adjacent fat. There is no extraluminal bowel gas or abscess. These findings raise the possibility of acute diverticulitis at the hepatic flexure. Stomach is decompressed. No evidence of small-bowel obstruction. Lymphatic: There is no abnormal retroperitoneal adenopathy. Reproductive: Prostate is within normal limits. Other: No free fluid Musculoskeletal: No vertebral  compression deformity. There is degenerative disc disease and facet arthropathy at L4-5 and L5-S1. IMPRESSION: VASCULAR No convincing evidence of active gastrointestinal bleeding. NON-VASCULAR Findings above suggest acute diverticulitis at the hepatic flexure. This is characterized by haziness of the wall of the diverticulum and stranding in the adjacent fat. There is no extraluminal bowel gas or abscess. There is no evidence of gastrointestinal bleeding at this location. Electronically Signed   By: Marybelle Killings M.D.   On: 08/11/2017 21:02      Impression / Plan:   Matthew Collier is a 52 y.o. y/o male with hematochezia that started yesterday, with stable hemodynamics, and hemoglobin of 12.9, and CT suggesting diverticulitis at the hepatic flexure  Symptoms are likely from diverticular bleeding The diverticular disease is noted on CT scan might be to diverticulitis, or possibly bleeding diverticulum in that area As expected, most diverticular bleeding resolved on their own, and this seems to be the case  in this patient as well  Since CT is reporting diverticulitis, would not recommend colonoscopy at this time in the setting of active inflammation, as that can increase risk of the procedure  Would recommend conservative management at this time since patient is already improving Upper GI bleed is less likely given clinical symptoms Agree with PPI twice daily Serial CBCs and transfuse PRN Avoid NSAIDs except aspirin as medically indicated  If patient continues to improve and bleeding resolved, EGD and colonoscopy can be scheduled as an outpatient, at least 6 weeks after this episode of diverticulitis noted on CT scan Continue antibiotics for diverticulitis  If GI bleeding does not resolve, can consider upper endoscopy first We will continue to follow  Thank you for involving me in the care of this patient.      LOS: 0 days   Virgel Manifold, MD  08/12/2017, 12:34 PM

## 2017-08-12 NOTE — Progress Notes (Addendum)
Matthew Collier at [facility] NAME: Matthew Collier    MR#:  [id number]  DATE OF BIRTH:  Apr 25, 1965  SUBJECTIVE:  CHIEF COMPLAINT:  No chief complaint on file.  Bloody stool this morning. REVIEW OF SYSTEMS:  Review of Systems  Constitutional: Negative for chills, fever and malaise/fatigue.  HENT: Negative for sore throat.   Eyes: Negative for blurred vision and double vision.  Respiratory: Negative for cough, hemoptysis, shortness of breath, wheezing and stridor.   Cardiovascular: Negative for chest pain, palpitations, orthopnea and leg swelling.  Gastrointestinal: Positive for blood in stool. Negative for abdominal pain, diarrhea, melena, nausea and vomiting.  Genitourinary: Negative for dysuria, flank pain and hematuria.  Musculoskeletal: Negative for back pain and joint pain.  Skin: Negative for rash.  Neurological: Negative for dizziness, sensory change, focal weakness, seizures, loss of consciousness, weakness and headaches.  Endo/Heme/Allergies: Negative for polydipsia.  Psychiatric/Behavioral: Negative for depression. The patient is not nervous/anxious.     DRUG ALLERGIES:   Allergies  Allergen Reactions  . Penicillins    VITALS:  Blood pressure 114/65, pulse 71, temperature 98 F (36.7 C), temperature source Oral, resp. rate 20, height 5\' 10"  (1.778 m), weight 226 lb 11.2 oz (102.8 kg), SpO2 98 %. PHYSICAL EXAMINATION:  Physical Exam  Constitutional: He is oriented to person, place, and time.  Obesity.  HENT:  Head: Normocephalic.  Mouth/Throat: Oropharynx is clear and moist.  Eyes: Pupils are equal, round, and reactive to light. Conjunctivae and EOM are normal. No scleral icterus.  Neck: Normal range of motion. Neck supple. No JVD present. No tracheal deviation present.  Cardiovascular: Normal rate, regular rhythm and normal heart sounds. Exam reveals no gallop.  No murmur heard. Pulmonary/Chest: Effort normal and breath sounds  normal. No respiratory distress. He has no wheezes. He has no rales.  Abdominal: Soft. Bowel sounds are normal. He exhibits no distension. There is no tenderness. There is no rebound.  Musculoskeletal: Normal range of motion. He exhibits no edema or tenderness.  Neurological: He is alert and oriented to person, place, and time. No cranial nerve deficit.  Skin: No rash noted. No erythema.   LABORATORY PANEL:  Male CBC Recent Labs  Lab 08/12/17 0305  WBC 9.4  HGB 12.9*  HCT 36.8*  PLT 174   ------------------------------------------------------------------------------------------------------------------ Chemistries  Recent Labs  Lab 08/11/17 1838 08/12/17 0305  NA 139 140  K 4.7 4.3  CL 106 108  CO2 25 26  GLUCOSE 220* 160*  BUN 17 16  CREATININE 0.74 0.78  CALCIUM 8.7* 8.2*  AST 23  --   ALT 25  --   ALKPHOS 47  --   BILITOT 0.6  --    RADIOLOGY:  Ct Angio Abd/pel W And/or Wo Contrast  Result Date: 08/11/2017 CLINICAL DATA:  Lower gastrointestinal bleeding EXAM: CTA ABDOMEN AND PELVIS wITHOUT AND WITH CONTRAST TECHNIQUE: Multidetector CT imaging of the abdomen and pelvis was performed using the standard protocol during bolus administration of intravenous contrast. Multiplanar reconstructed images and MIPs were obtained and reviewed to evaluate the vascular anatomy. CONTRAST:  186mL ISOVUE-370 IOPAMIDOL (ISOVUE-370) INJECTION 76% COMPARISON:  None. FINDINGS: VASCULAR Aorta: Nonaneurysmal and patent. Celiac: Patent.  Branch vessels patent. SMA: Patent. Renals: 2 right and 2 left renal arteries are widely patent. IMA: Patent. Inflow: Bilateral common, internal, and external iliac arteries are patent. Proximal Outflow: Grossly patent. Veins: There is no evidence of DVT. Review of the MIP images confirms the above findings. NON-VASCULAR  Lower chest: Left ventricle myocardial hypertrophy is present. Subsegmental atelectasis at the base of the anterior right lower lobe. Hepatobiliary:  Diffuse hepatic steatosis.  Unremarkable gallbladder. Pancreas: Unremarkable Spleen: Unremarkable Adrenals/Urinary Tract: Simple cyst in the mid mid right kidney. Left kidney contains a tiny cyst. Adrenal glands are unremarkable. Bladder is unremarkable. Stomach/Bowel: There is no convincing focus of arterial phase contrast extravasation with pooling on venous phase images to suggest active gastrointestinal bleeding. There are foci of high density areas within the stool of the distal sigmoid colon which are stable between arterial and venous phase. This is seen in other parts of the colon and likely reflects hyperdense stool. Colonic diverticulosis affects the descending and sigmoid colon, and to a lesser degree, the transverse colon. There is one diverticulum at the level of the hepatic flexure which is ill-defined. There is stranding in the adjacent fat. There is no extraluminal bowel gas or abscess. These findings raise the possibility of acute diverticulitis at the hepatic flexure. Stomach is decompressed. No evidence of small-bowel obstruction. Lymphatic: There is no abnormal retroperitoneal adenopathy. Reproductive: Prostate is within normal limits. Other: No free fluid Musculoskeletal: No vertebral compression deformity. There is degenerative disc disease and facet arthropathy at L4-5 and L5-S1. IMPRESSION: VASCULAR No convincing evidence of active gastrointestinal bleeding. NON-VASCULAR Findings above suggest acute diverticulitis at the hepatic flexure. This is characterized by haziness of the wall of the diverticulum and stranding in the adjacent fat. There is no extraluminal bowel gas or abscess. There is no evidence of gastrointestinal bleeding at this location. Electronically Signed   By: Marybelle Killings M.D.   On: 08/11/2017 21:02   ASSESSMENT AND PLAN:   GI bleeding with melena and bloody stool. Start clear liquid diet, Protonix BID, follow-up hemoglobin. Recommend conservative management at this  time since patient is already improving; If patient continues to improve and bleeding resolved, EGD and colonoscopy can be scheduled as an outpatient, at least 6 weeks after this episode of diverticulitis noted on CT scan Continue antibiotics for diverticulitis; If GI bleeding does not resolve, can consider upper endoscopy first per Dr. Bonna Gains.  Syncope.  Possible due to GI bleeding.  Acute diverticulitis per CT of the abdomen.  Start Cipro and Flagyl.  Hypertension.  Hold lisinopril due to low side blood pressure.  Diabetes.  Started sliding scale.  Hold glipizide. Discussed with Dr. Bonna Gains. All the records are reviewed and case discussed with Care Management/Social Worker. Management plans discussed with the patient, his wife and daughter and they are in agreement.  CODE STATUS: Full Code  TOTAL TIME TAKING CARE OF THIS PATIENT: 33 minutes.   More than 50% of the time was spent in counseling/coordination of care: YES  POSSIBLE D/C IN 1 DAYS, DEPENDING ON CLINICAL CONDITION.   Demetrios Loll M.D on 08/12/2017 at 3:47 PM  Between 7am to 6pm - Pager - [phone]  After 6pm go to [email or web address] - [facility]

## 2017-08-12 NOTE — Plan of Care (Signed)

## 2017-08-12 NOTE — Progress Notes (Signed)
Patient had a black lumpy stool with red bright bleeding. The water in the commode was red. MD notified. Will continue to monitor patient.

## 2017-08-12 NOTE — Progress Notes (Signed)
Gave handoff report to Rashida, RN

## 2017-08-12 NOTE — Plan of Care (Signed)
  Problem: Health Behavior/Discharge Planning: Goal: Ability to manage health-related needs will improve Outcome: Progressing   Problem: Clinical Measurements: Goal: Will remain free from infection Outcome: Progressing Goal: Respiratory complications will improve Outcome: Progressing Goal: Cardiovascular complication will be avoided Outcome: Progressing   Problem: Activity: Goal: Risk for activity intolerance will decrease Outcome: Progressing   Problem: Elimination: Goal: Will not experience complications related to bowel motility Outcome: Progressing Goal: Will not experience complications related to urinary retention Outcome: Progressing   Problem: Pain Managment: Goal: General experience of comfort will improve Outcome: Progressing   Problem: Safety: Goal: Ability to remain free from injury will improve Outcome: Progressing   Problem: Skin Integrity: Goal: Risk for impaired skin integrity will decrease Outcome: Progressing   

## 2017-08-13 DIAGNOSIS — E119 Type 2 diabetes mellitus without complications: Secondary | ICD-10-CM | POA: Diagnosis not present

## 2017-08-13 DIAGNOSIS — K922 Gastrointestinal hemorrhage, unspecified: Secondary | ICD-10-CM | POA: Diagnosis not present

## 2017-08-13 DIAGNOSIS — I10 Essential (primary) hypertension: Secondary | ICD-10-CM | POA: Diagnosis not present

## 2017-08-13 DIAGNOSIS — R55 Syncope and collapse: Secondary | ICD-10-CM | POA: Diagnosis not present

## 2017-08-13 LAB — HEMOGLOBIN
HEMOGLOBIN: 12.2 g/dL — AB (ref 13.0–18.0)
HEMOGLOBIN: 12.1 g/dL — AB (ref 13.0–18.0)

## 2017-08-13 LAB — GLUCOSE, CAPILLARY: Glucose-Capillary: 155 mg/dL — ABNORMAL HIGH (ref 70–99)

## 2017-08-13 LAB — HIV ANTIBODY (ROUTINE TESTING W REFLEX): HIV Screen 4th Generation wRfx: NONREACTIVE

## 2017-08-13 MED ORDER — METRONIDAZOLE 500 MG PO TABS: 500.0000 mg | ORAL_TABLET | Freq: Three times a day (TID) | ORAL | 0 refills | Status: DC

## 2017-08-13 MED ORDER — CIPROFLOXACIN HCL 500 MG PO TABS
500.0000 mg | ORAL_TABLET | Freq: Two times a day (BID) | ORAL | Status: DC
  Administered 2017-08-13: 08:00:00 500 mg via ORAL
  Filled 2017-08-13: qty 1

## 2017-08-13 MED ORDER — CIPROFLOXACIN HCL 500 MG PO TABS: 500.0000 mg | ORAL_TABLET | Freq: Two times a day (BID) | ORAL | 0 refills | Status: DC

## 2017-08-13 NOTE — Plan of Care (Signed)
Pt d/ced home.  No pain. Hgb this am was 12.2.  Pt called me into look at stool - it was black w/frank red blood in toilet.  Informed Dr. Bridgett Larsson - he repeated Hgb and it was 12.1.  Proceeded with d/c.  Pt provided with work note and told to take it extremely easy.  Pt will go home on oral abx.  SWOT nurse will review d/c instructions and f/u appts.  Nurse tech removed IV.  Wife is taking him home.

## 2017-08-13 NOTE — Progress Notes (Signed)
Discharge instructions reviewed with patient. No IV present. Prescriptions given to patient and appointments discussed. Patient awaiting w/c at this time.

## 2017-08-13 NOTE — Plan of Care (Signed)
  Problem: Education: °Goal: Knowledge of General Education information will improve °Description: Including pain rating scale, medication(s)/side effects and non-pharmacologic comfort measures °Outcome: Progressing °  °Problem: Clinical Measurements: °Goal: Ability to maintain clinical measurements within normal limits will improve °Outcome: Progressing °  °Problem: Elimination: °Goal: Will not experience complications related to bowel motility °Outcome: Progressing °Goal: Will not experience complications related to urinary retention °Outcome: Progressing °  °Problem: Pain Managment: °Goal: General experience of comfort will improve °Outcome: Progressing °  °Problem: Safety: °Goal: Ability to remain free from injury will improve °Outcome: Progressing °  °Problem: Skin Integrity: °Goal: Risk for impaired skin integrity will decrease °Outcome: Progressing °  °

## 2017-08-13 NOTE — Progress Notes (Addendum)
[place] at [facility] was admitted to the Hospital on 08/11/2017 and Discharged  08/13/2017 and should be excused from work/school   for 8 days starting 08/11/2017 , may return to work/school without any restrictions.  Demetrios Loll M.D on 08/13/2017,at 8:43 AM  [place] at [facility]  [phone]

## 2017-08-13 NOTE — Discharge Summary (Signed)
[facility] [place] NAME: Matthew Collier    MR#:  [id number]  DATE OF BIRTH:  November 17, 1965  DATE OF ADMISSION:  08/11/2017   ADMITTING PHYSICIAN: Demetrios Loll, MD  DATE OF DISCHARGE: 08/13/2017 12:10 PM  PRIMARY CARE PHYSICIAN: Guadalupe Maple, MD   ADMISSION DIAGNOSIS:  Rectal Bleed DISCHARGE DIAGNOSIS:  Active Problems:   GIB (gastrointestinal bleeding)  SECONDARY DIAGNOSIS:   Past Medical History:  Diagnosis Date  . Diabetes mellitus without complication ([place])   . Hyperlipidemia   . Hypertension    HOSPITAL COURSE:  GI bleeding with melena and bloody stool. Recommend conservative management at this time since patient is already improving; If patient continues to improve and bleeding resolved, EGD and colonoscopy can be scheduled as an outpatient, at least 6 weeks after this episode of diverticulitis noted on CT scan. Continue antibiotics for diverticulitis; If GI bleeding does not resolve, can consider upper endoscopy first per Dr. Bonna Gains. The patient has flulike and dark red stool in toilet this morning.  Repeat hemoglobin is 12.1.  Follow-up GI within 1 week.  Hold aspirin.  Syncope. Possible due to GI bleeding.  Acute diverticulitis per CT of the abdomen.    Continue Cipro and Flagyl.  Hypertension. Hold lisinopril due to low side blood pressure.   Diabetes. Started sliding scale. Resume glipizide after discharge. DISCHARGE CONDITIONS:  Stable, discharged to home today. CONSULTS OBTAINED:  Treatment Team:  Virgel Manifold, MD DRUG ALLERGIES:   Allergies  Allergen Reactions  . Penicillins    DISCHARGE MEDICATIONS:   Allergies as of 08/13/2017      Reactions   Penicillins       Medication List    STOP taking these medications   aspirin EC 81 MG tablet     TAKE these medications   benazepril 40 MG tablet Commonly known as:  LOTENSIN Take 1 tablet (40 mg total) by mouth daily.   ciprofloxacin 500  MG tablet Commonly known as:  CIPRO Take 1 tablet (500 mg total) by mouth 2 (two) times daily.   Dapagliflozin-metFORMIN HCl ER 05-998 MG Tb24 Commonly known as:  XIGDUO XR Take 1 tablet by mouth 2 (two) times daily.   Exenatide ER 2 MG Pen Commonly known as:  BYDUREON INJECT 1 PEN (2MG ) SUBCUTANEOUSLY ONCE WEEKLY.   glipiZIDE 5 MG tablet Commonly known as:  GLUCOTROL Take 5 mg by mouth daily as needed (high blood sugar).   metroNIDAZOLE 500 MG tablet Commonly known as:  FLAGYL Take 1 tablet (500 mg total) by mouth every 8 (eight) hours.   ONE TOUCH ULTRA TEST test strip Generic drug:  glucose blood USE AS DIRECTED. NEED NEW INSURANCE.   simvastatin 40 MG tablet Commonly known as:  ZOCOR Take 1 tablet (40 mg total) by mouth daily.        DISCHARGE INSTRUCTIONS:  See AVS.  If you experience worsening of your admission symptoms, develop shortness of breath, life threatening emergency, suicidal or homicidal thoughts you must seek medical attention immediately by calling 911 or calling your MD immediately  if symptoms less severe.  You Must read complete instructions/literature along with all the possible adverse reactions/side effects for all the Medicines you take and that have been prescribed to you. Take any new Medicines after you have completely understood and accpet all the possible adverse reactions/side effects.   Please note  You were cared for by a hospitalist during your hospital stay. If you have  any questions about your discharge medications or the care you received while you were in the hospital after you are discharged, you can call the unit and asked to speak with the hospitalist on call if the hospitalist that took care of you is not available. Once you are discharged, your primary care physician will handle any further medical issues. Please note that NO REFILLS for any discharge medications will be authorized once you are discharged, as it is imperative that  you return to your primary care physician (or establish a relationship with a primary care physician if you do not have one) for your aftercare needs so that they can reassess your need for medications and monitor your lab values.    On the day of Discharge:  VITAL SIGNS:  Blood pressure 105/71, pulse 70, temperature 97.6 F (36.4 C), temperature source Oral, resp. rate 16, height 5\' 10"  (1.778 m), weight 226 lb 11.2 oz (102.8 kg), SpO2 97 %. PHYSICAL EXAMINATION:  GENERAL:  52 y.o.-year-old patient lying in the bed with no acute distress.  EYES: Pupils equal, round, reactive to light and accommodation. No scleral icterus. Extraocular muscles intact.  HEENT: Head atraumatic, normocephalic. Oropharynx and nasopharynx clear.  NECK:  Supple, no jugular venous distention. No thyroid enlargement, no tenderness.  LUNGS: Normal breath sounds bilaterally, no wheezing, rales,rhonchi or crepitation. No use of accessory muscles of respiration.  CARDIOVASCULAR: S1, S2 normal. No murmurs, rubs, or gallops.  ABDOMEN: Soft, non-tender, non-distended. Bowel sounds present. No organomegaly or mass.  EXTREMITIES: No pedal edema, cyanosis, or clubbing.  NEUROLOGIC: Cranial nerves II through XII are intact. Muscle strength 5/5 in all extremities. Sensation intact. Gait not checked.  PSYCHIATRIC: The patient is alert and oriented x 3.  SKIN: No obvious rash, lesion, or ulcer.  DATA REVIEW:   CBC Recent Labs  Lab 08/12/17 0305  08/13/17 1029  WBC 9.4  --   --   HGB 12.9*   < > 12.1*  HCT 36.8*  --   --   PLT 174  --   --    < > = values in this interval not displayed.    Chemistries  Recent Labs  Lab 08/11/17 1838 08/12/17 0305  NA 139 140  K 4.7 4.3  CL 106 108  CO2 25 26  GLUCOSE 220* 160*  BUN 17 16  CREATININE 0.74 0.78  CALCIUM 8.7* 8.2*  AST 23  --   ALT 25  --   ALKPHOS 47  --   BILITOT 0.6  --      Microbiology Results  Results for orders placed or performed in visit on  10/31/16  Microscopic Examination     Status: None   Collection Time: 10/31/16  9:13 AM  Result Value Ref Range Status   WBC, UA None seen 0 - 5 /hpf Final   RBC, UA None seen 0 - 2 /hpf Final   Epithelial Cells (non renal) CANCELED      Comment: Test not performed  Result canceled by the ancillary    Bacteria, UA None seen None seen/Few Final    RADIOLOGY:  No results found.   Management plans discussed with the patient, family and they are in agreement.  CODE STATUS: Prior   TOTAL TIME TAKING CARE OF THIS PATIENT: 32 minutes.    Demetrios Loll M.D on 08/13/2017 at 3:30 PM  Between 7am to 6pm - Pager - [phone]  After 6pm go to [email or web address] - [facility]  [facility]  Office  [phone]  CC: Primary care physician; Guadalupe Maple, MD   Note: This dictation was prepared with Dragon dictation along with smaller phrase technology. Any transcriptional errors that result from this process are unintentional.

## 2017-08-15 ENCOUNTER — Telehealth: Payer: Self-pay

## 2017-08-15 NOTE — Telephone Encounter (Signed)
I have made the 1st attempt to contact the patient or family member in charge, in order to follow up from recently being discharged from the hospital. I left a message on voicemail but I will make another attempt at a different time.   Direct call back number: [phone]

## 2017-08-15 NOTE — Telephone Encounter (Signed)
I have made the 2nd attempt to contact the patient or family member in charge, in order to follow up from recently being discharged from the hospital.unable to leave VM on home phone.

## 2017-08-20 NOTE — Progress Notes (Signed)
Subjective:    Patient ID: Matthew Collier, male    DOB: Sep 25, 1965, 52 y.o.   MRN: [id number]  Matthew Collier is a 52 y.o. male presenting on 08/21/2017 for Hospitalization Follow-up (pt states went to the hospital with blood in stool. states he is feeling better now)   HPI   Patient is presenting today for hospital follow up. He was admitted to [facility] on 08/11/2017 and discharged 08/13/2017 for GI hemorrhage. Ct abdomen/pelvis showed diverticulitis. Patient had been using NSAIDs however GI consult reported bleeding likely due to diverticular disease. He was started on PPI, ASA was discontinued and he was instructed to avod NSAIDs. Hemoglobin remained stable. He was treated with cipro 500 mg BID x 7 days and flagyl 500 mg TID x 7 days. He has one more day to finish of flagyl and he finished ciprofloxacin yesterday. He was instructed to follow up outpatient with GI. He has an appointment with GI on 09/18/2017.   CBC Latest Ref Rng & Units 08/13/2017 08/13/2017 08/12/2017  WBC 3.8 - 10.6 K/uL - - -  Hemoglobin 13.0 - 18.0 g/dL 12.1(L) 12.2(L) 12.5(L)  Hematocrit 40.0 - 52.0 % - - -  Platelets 150 - 440 K/uL - - -   Today he reports he is feeling better. He returned to work yesterday and feels a little weak but still able to perform his job. He does have some loose stools but they are largely formed. He denies fevers and chills.     Social History   Tobacco Use  . Smoking status: Never Smoker  . Smokeless tobacco: Never Used  Substance Use Topics  . Alcohol use: Yes    Comment: occasional  . Drug use: No    Review of Systems Per HPI unless specifically indicated above     Objective:    BP (!) 141/85   Pulse 89   Temp 98.3 F (36.8 C) (Oral)   Ht 5\' 10"  (1.778 m)   Wt 230 lb (104.3 kg)   SpO2 97%   BMI 33.00 kg/m   Wt Readings from Last 3 Encounters:  08/21/17 230 lb (104.3 kg)  08/11/17 226 lb 11.2 oz (102.8 kg)  05/05/17 227 lb (103 kg)    Physical Exam  Constitutional: He is  oriented to person, place, and time. He appears well-developed and well-nourished.  Cardiovascular: Normal rate and regular rhythm.  Pulmonary/Chest: Effort normal and breath sounds normal.  Abdominal: Soft. Bowel sounds are normal.  Neurological: He is alert and oriented to person, place, and time.  Skin: Skin is warm and dry. Capillary refill takes less than 2 seconds. No pallor.  Psychiatric: He has a normal mood and affect. His behavior is normal.   Results for orders placed or performed during the hospital encounter of 08/11/17  Comprehensive metabolic panel  Result Value Ref Range   Sodium 139 135 - 145 mmol/L   Potassium 4.7 3.5 - 5.1 mmol/L   Chloride 106 98 - 111 mmol/L   CO2 25 22 - 32 mmol/L   Glucose, Bld 220 (H) 70 - 99 mg/dL   BUN 17 6 - 20 mg/dL   Creatinine, Ser 0.74 0.61 - 1.24 mg/dL   Calcium 8.7 (L) 8.9 - 10.3 mg/dL   Total Protein 6.7 6.5 - 8.1 g/dL   Albumin 3.9 3.5 - 5.0 g/dL   AST 23 15 - 41 U/L   ALT 25 0 - 44 U/L   Alkaline Phosphatase 47 38 - 126 U/L  Total Bilirubin 0.6 0.3 - 1.2 mg/dL   GFR calc non Af Amer >60 >60 mL/min   GFR calc Af Amer >60 >60 mL/min   Anion gap 8 5 - 15  CBC  Result Value Ref Range   WBC 9.1 3.8 - 10.6 K/uL   RBC 4.65 4.40 - 5.90 MIL/uL   Hemoglobin 15.1 13.0 - 18.0 g/dL   HCT 43.6 40.0 - 52.0 %   MCV 93.8 80.0 - 100.0 fL   MCH 32.6 26.0 - 34.0 pg   MCHC 34.8 32.0 - 36.0 g/dL   RDW 12.7 11.5 - 14.5 %   Platelets 219 150 - 440 K/uL  HIV antibody (Routine Testing)  Result Value Ref Range   HIV Screen 4th Generation wRfx Non Reactive Non Reactive  Basic metabolic panel  Result Value Ref Range   Sodium 140 135 - 145 mmol/L   Potassium 4.3 3.5 - 5.1 mmol/L   Chloride 108 98 - 111 mmol/L   CO2 26 22 - 32 mmol/L   Glucose, Bld 160 (H) 70 - 99 mg/dL   BUN 16 6 - 20 mg/dL   Creatinine, Ser 0.78 0.61 - 1.24 mg/dL   Calcium 8.2 (L) 8.9 - 10.3 mg/dL   GFR calc non Af Amer >60 >60 mL/min   GFR calc Af Amer >60 >60 mL/min    Anion gap 6 5 - 15  CBC  Result Value Ref Range   WBC 9.4 3.8 - 10.6 K/uL   RBC 3.94 (L) 4.40 - 5.90 MIL/uL   Hemoglobin 12.9 (L) 13.0 - 18.0 g/dL   HCT 36.8 (L) 40.0 - 52.0 %   MCV 93.3 80.0 - 100.0 fL   MCH 32.8 26.0 - 34.0 pg   MCHC 35.1 32.0 - 36.0 g/dL   RDW 12.7 11.5 - 14.5 %   Platelets 174 150 - 440 K/uL  Hemoglobin  Result Value Ref Range   Hemoglobin 14.4 13.0 - 18.0 g/dL  Glucose, capillary  Result Value Ref Range   Glucose-Capillary 206 (H) 70 - 99 mg/dL  Glucose, capillary  Result Value Ref Range   Glucose-Capillary 136 (H) 70 - 99 mg/dL  Hemoglobin  Result Value Ref Range   Hemoglobin 12.5 (L) 13.0 - 18.0 g/dL  Glucose, capillary  Result Value Ref Range   Glucose-Capillary 274 (H) 70 - 99 mg/dL  Glucose, capillary  Result Value Ref Range   Glucose-Capillary 138 (H) 70 - 99 mg/dL  Hemoglobin  Result Value Ref Range   Hemoglobin 12.2 (L) 13.0 - 18.0 g/dL  Glucose, capillary  Result Value Ref Range   Glucose-Capillary 149 (H) 70 - 99 mg/dL  Glucose, capillary  Result Value Ref Range   Glucose-Capillary 155 (H) 70 - 99 mg/dL  Hemoglobin  Result Value Ref Range   Hemoglobin 12.1 (L) 13.0 - 18.0 g/dL  Type and screen [facility]  Result Value Ref Range   ABO/RH(D) O NEG    Antibody Screen NEG    Sample Expiration      08/14/2017 Performed at [facility], [address] [place], [place] [address]       Assessment & Plan:  1. Hospital discharge follow-up  I have reconciled the medication lists. I have reviewed hospital notes and imaging. I have reviewed labwork. His last hemoglobin was 12.9 one week ago. He is feeling well without dizziness, syncope, tachycardia. I do not think his RBCs have had time to regenerate and as his symptoms have improved without signs  of anemia, I think he can wait until his GI appointment or follow up with PCP to redraw CBC.  2. Gastrointestinal hemorrhage, unspecified gastrointestinal  hemorrhage type  See above.      Follow up plan: Return if symptoms worsen or fail to improve.  Carles Collet, PA-C [facility] 08/21/2017, 12:54 PM

## 2017-08-21 ENCOUNTER — Encounter: Payer: Self-pay | Admitting: Physician Assistant

## 2017-08-21 ENCOUNTER — Ambulatory Visit: Payer: BLUE CROSS/BLUE SHIELD | Admitting: Physician Assistant

## 2017-08-21 ENCOUNTER — Other Ambulatory Visit: Payer: Self-pay

## 2017-08-21 VITALS — BP 141/85 | HR 89 | Temp 98.3°F | Ht 70.0 in | Wt 230.0 lb

## 2017-08-21 DIAGNOSIS — K922 Gastrointestinal hemorrhage, unspecified: Secondary | ICD-10-CM

## 2017-08-21 DIAGNOSIS — Z09 Encounter for follow-up examination after completed treatment for conditions other than malignant neoplasm: Secondary | ICD-10-CM

## 2017-08-21 LAB — TYPE AND SCREEN
ABO/RH(D): O NEG
Antibody Screen: NEGATIVE

## 2017-09-10 ENCOUNTER — Ambulatory Visit: Payer: BLUE CROSS/BLUE SHIELD | Admitting: Gastroenterology

## 2017-09-10 ENCOUNTER — Ambulatory Visit: Payer: BLUE CROSS/BLUE SHIELD | Admitting: Family Medicine

## 2017-09-18 ENCOUNTER — Ambulatory Visit: Payer: BLUE CROSS/BLUE SHIELD | Admitting: Gastroenterology

## 2017-09-18 ENCOUNTER — Encounter: Payer: Self-pay | Admitting: Gastroenterology

## 2017-09-18 ENCOUNTER — Other Ambulatory Visit: Payer: Self-pay

## 2017-09-18 VITALS — BP 114/66 | HR 89 | Ht 70.0 in | Wt 224.2 lb

## 2017-09-18 DIAGNOSIS — K921 Melena: Secondary | ICD-10-CM

## 2017-09-18 DIAGNOSIS — K5732 Diverticulitis of large intestine without perforation or abscess without bleeding: Secondary | ICD-10-CM

## 2017-09-18 NOTE — Progress Notes (Signed)
Vonda Antigua, MD [address]  [place]  [place], [place] [address]  Main: [phone]  Fax: [phone]   Primary Care Physician: Guadalupe Maple, MD  Primary Gastroenterologist:  Dr. Vonda Antigua  Chief Complaint  Patient presents with  . New Patient (Initial Visit)    f/u GI bleed in hospital    HPI: Matthew Collier is a 52 y.o. male here for follow-up of hematochezia.  However, due to CT scan on his hospital admission showing diverticulitis, colonoscopy was deferred.  Hematochezia resolved prior to hospital discharge.  Patient reports one episode of minute amount of blood in toilet paper yesterday.  Also reports feeling an external hemorrhoid yesterday as well.  No prior EGD or colonoscopy.  No abdominal pain.  No nausea or vomiting.  No dysphagia.  Does report straining with bowel movements at times.  No family history of colon cancer.  Current Outpatient Medications  Medication Sig Dispense Refill  . benazepril (LOTENSIN) 40 MG tablet Take 1 tablet (40 mg total) by mouth daily. 90 tablet 4  . Dapagliflozin-metFORMIN HCl ER (XIGDUO XR) 05-998 MG TB24 Take 1 tablet by mouth 2 (two) times daily. 180 tablet 4  . Exenatide ER (BYDUREON) 2 MG PEN INJECT 1 PEN (2MG ) SUBCUTANEOUSLY ONCE WEEKLY. 12 each 4  . glipiZIDE (GLUCOTROL) 5 MG tablet Take 5 mg by mouth daily as needed (high blood sugar).    . ONE TOUCH ULTRA TEST test strip USE AS DIRECTED. NEED NEW INSURANCE. 100 each 12  . simvastatin (ZOCOR) 40 MG tablet Take 1 tablet (40 mg total) by mouth daily. 90 tablet 4   No current facility-administered medications for this visit.     Allergies as of 09/18/2017 - Review Complete 09/18/2017  Allergen Reaction Noted  . Penicillins  09/02/2014    ROS:  General: Negative for anorexia, weight loss, fever, chills, fatigue, weakness. ENT: Negative for hoarseness, difficulty swallowing , nasal congestion. CV: Negative for chest pain, angina, palpitations, dyspnea on  exertion, peripheral edema.  Respiratory: Negative for dyspnea at rest, dyspnea on exertion, cough, sputum, wheezing.  GI: See history of present illness. GU:  Negative for dysuria, hematuria, urinary incontinence, urinary frequency, nocturnal urination.  Endo: Negative for unusual weight change.    Physical Examination:   BP 114/66   Pulse 89   Ht 5\' 10"  (1.778 m)   Wt 224 lb 3.2 oz (101.7 kg)   BMI 32.17 kg/m   General: Well-nourished, well-developed in no acute distress.  Eyes: No icterus. Conjunctivae pink. Mouth: Oropharyngeal mucosa moist and pink , no lesions erythema or exudate. Neck: Supple, Trachea midline Abdomen: Bowel sounds are normal, nontender, nondistended, no hepatosplenomegaly or masses, no abdominal bruits or hernia , no rebound or guarding.   Extremities: No lower extremity edema. No clubbing or deformities. Neuro: Alert and oriented x 3.  Grossly intact. Skin: Warm and dry, no jaundice.   Psych: Alert and cooperative, normal mood and affect.   Labs: CMP     Component Value Date/Time   NA 140 08/12/2017 0305   NA 141 05/05/2017 1605   K 4.3 08/12/2017 0305   CL 108 08/12/2017 0305   CO2 26 08/12/2017 0305   GLUCOSE 160 (H) 08/12/2017 0305   BUN 16 08/12/2017 0305   BUN 14 05/05/2017 1605   CREATININE 0.78 08/12/2017 0305   CALCIUM 8.2 (L) 08/12/2017 0305   PROT 6.7 08/11/2017 1838   PROT 6.8 10/31/2016 0919   ALBUMIN 3.9 08/11/2017 1838  ALBUMIN 4.5 10/31/2016 0919   AST 23 08/11/2017 1838   AST 26 05/05/2017 1556   ALT 25 08/11/2017 1838   ALT 40 05/05/2017 1556   ALKPHOS 47 08/11/2017 1838   BILITOT 0.6 08/11/2017 1838   BILITOT 0.5 10/31/2016 0919   GFRNONAA >60 08/12/2017 0305   GFRAA >60 08/12/2017 0305   Lab Results  Component Value Date   WBC 9.4 08/12/2017   HGB 12.1 (L) 08/13/2017   HCT 36.8 (L) 08/12/2017   MCV 93.3 08/12/2017   PLT 174 08/12/2017    Imaging Studies: No results found.  Assessment and Plan:   Matthew Collier is a 52 y.o. y/o male here for follow-up of hematochezia  It is likely due to external hemorrhoids However, patient has never had a screening colonoscopy, and screening colonoscopy would allow for removal of polyps, and ruling out colon cancer as source of hematochezia  Due to mild hemoglobin dropped to 12.2 from 14.4 Baseline during hospital admission we will also perform EGD along with colonoscopy to rule out any gastric ulcers or other source of hematochezia However, this is less likely  Patient scheduled for both procedure in September 27, which is at least 6 weeks out from his episode of diverticulitis No symptoms of diverticulitis present at this time  I have discussed alternative options, risks & benefits,  which include, but are not limited to, bleeding, infection, perforation,respiratory complication & drug reaction.  The patient agrees with this plan & written consent will be obtained.    High-fiber diet MiraLAX or Metamucil daily with goal of 1-2 soft bowel movements daily.  If not at goal, patient instructed to increase dose to twice daily.  If loose stools with the medication, patient asked to decrease the medication to every other day, or half dose daily.  Patient verbalized understanding   Dr Vonda Antigua

## 2017-09-19 ENCOUNTER — Other Ambulatory Visit: Payer: Self-pay

## 2017-09-19 DIAGNOSIS — Z1211 Encounter for screening for malignant neoplasm of colon: Secondary | ICD-10-CM

## 2017-09-19 DIAGNOSIS — K625 Hemorrhage of anus and rectum: Secondary | ICD-10-CM

## 2017-10-03 ENCOUNTER — Ambulatory Visit: Payer: BLUE CROSS/BLUE SHIELD | Admitting: Anesthesiology

## 2017-10-03 ENCOUNTER — Encounter
Admission: RE | Disposition: A | Discharge status: Home / Self Care | Payer: Self-pay | Source: Ambulatory Visit | Attending: Gastroenterology

## 2017-10-03 ENCOUNTER — Ambulatory Visit
Admission: RE | Admit: 2017-10-03 | Discharge: 2017-10-03 | Disposition: A | Discharge status: Home / Self Care | Payer: BLUE CROSS/BLUE SHIELD | Source: Ambulatory Visit | Attending: Gastroenterology | Admitting: Gastroenterology

## 2017-10-03 DIAGNOSIS — K2950 Unspecified chronic gastritis without bleeding: Secondary | ICD-10-CM | POA: Insufficient documentation

## 2017-10-03 DIAGNOSIS — K621 Rectal polyp: Secondary | ICD-10-CM | POA: Insufficient documentation

## 2017-10-03 DIAGNOSIS — E785 Hyperlipidemia, unspecified: Secondary | ICD-10-CM | POA: Diagnosis not present

## 2017-10-03 DIAGNOSIS — D125 Benign neoplasm of sigmoid colon: Secondary | ICD-10-CM | POA: Diagnosis not present

## 2017-10-03 DIAGNOSIS — K5790 Diverticulosis of intestine, part unspecified, without perforation or abscess without bleeding: Secondary | ICD-10-CM | POA: Diagnosis not present

## 2017-10-03 DIAGNOSIS — D123 Benign neoplasm of transverse colon: Secondary | ICD-10-CM | POA: Diagnosis not present

## 2017-10-03 DIAGNOSIS — D120 Benign neoplasm of cecum: Secondary | ICD-10-CM | POA: Diagnosis not present

## 2017-10-03 DIAGNOSIS — K3189 Other diseases of stomach and duodenum: Secondary | ICD-10-CM

## 2017-10-03 DIAGNOSIS — K2270 Barrett's esophagus without dysplasia: Secondary | ICD-10-CM | POA: Diagnosis not present

## 2017-10-03 DIAGNOSIS — Z7984 Long term (current) use of oral hypoglycemic drugs: Secondary | ICD-10-CM | POA: Insufficient documentation

## 2017-10-03 DIAGNOSIS — I10 Essential (primary) hypertension: Secondary | ICD-10-CM | POA: Diagnosis not present

## 2017-10-03 DIAGNOSIS — Z79899 Other long term (current) drug therapy: Secondary | ICD-10-CM | POA: Insufficient documentation

## 2017-10-03 DIAGNOSIS — E119 Type 2 diabetes mellitus without complications: Secondary | ICD-10-CM | POA: Diagnosis not present

## 2017-10-03 DIAGNOSIS — K5730 Diverticulosis of large intestine without perforation or abscess without bleeding: Secondary | ICD-10-CM | POA: Diagnosis not present

## 2017-10-03 DIAGNOSIS — K921 Melena: Secondary | ICD-10-CM | POA: Diagnosis not present

## 2017-10-03 DIAGNOSIS — D1779 Benign lipomatous neoplasm of other sites: Secondary | ICD-10-CM | POA: Insufficient documentation

## 2017-10-03 DIAGNOSIS — Z1211 Encounter for screening for malignant neoplasm of colon: Secondary | ICD-10-CM | POA: Diagnosis not present

## 2017-10-03 DIAGNOSIS — D124 Benign neoplasm of descending colon: Secondary | ICD-10-CM | POA: Diagnosis not present

## 2017-10-03 DIAGNOSIS — D127 Benign neoplasm of rectosigmoid junction: Secondary | ICD-10-CM

## 2017-10-03 DIAGNOSIS — Q402 Other specified congenital malformations of stomach: Secondary | ICD-10-CM | POA: Diagnosis not present

## 2017-10-03 DIAGNOSIS — K635 Polyp of colon: Secondary | ICD-10-CM | POA: Diagnosis not present

## 2017-10-03 DIAGNOSIS — D175 Benign lipomatous neoplasm of intra-abdominal organs: Secondary | ICD-10-CM

## 2017-10-03 DIAGNOSIS — K625 Hemorrhage of anus and rectum: Secondary | ICD-10-CM

## 2017-10-03 DIAGNOSIS — K5792 Diverticulitis of intestine, part unspecified, without perforation or abscess without bleeding: Secondary | ICD-10-CM

## 2017-10-03 HISTORY — PX: COLONOSCOPY WITH PROPOFOL: SHX5780

## 2017-10-03 HISTORY — PX: ESOPHAGOGASTRODUODENOSCOPY (EGD) WITH PROPOFOL: SHX5813

## 2017-10-03 LAB — GLUCOSE, CAPILLARY: Glucose-Capillary: 189 mg/dL — ABNORMAL HIGH (ref 70–99)

## 2017-10-03 SURGERY — COLONOSCOPY WITH PROPOFOL
Anesthesia: General

## 2017-10-03 MED ORDER — MIDAZOLAM HCL 5 MG/5ML IJ SOLN
INTRAMUSCULAR | Status: DC | PRN
  Administered 2017-10-03: 1 mg via INTRAVENOUS

## 2017-10-03 MED ORDER — PROPOFOL 500 MG/50ML IV EMUL
INTRAVENOUS | Status: DC | PRN
  Administered 2017-10-03: 140 ug/kg/min via INTRAVENOUS

## 2017-10-03 MED ORDER — PANTOPRAZOLE SODIUM 20 MG PO TBEC: 20.0000 mg | DELAYED_RELEASE_TABLET | Freq: Every day | ORAL | 1 refills | Status: DC

## 2017-10-03 MED ORDER — FENTANYL CITRATE (PF) 100 MCG/2ML IJ SOLN
INTRAMUSCULAR | Status: DC | PRN
  Administered 2017-10-03: 50 ug via INTRAVENOUS

## 2017-10-03 MED ORDER — PROPOFOL 10 MG/ML IV BOLUS
INTRAVENOUS | Status: DC | PRN
  Administered 2017-10-03 (×3): 50 mg via INTRAVENOUS

## 2017-10-03 MED ORDER — SODIUM CHLORIDE 0.9 % IV SOLN
INTRAVENOUS | Status: DC
  Administered 2017-10-03 (×2): via INTRAVENOUS

## 2017-10-03 NOTE — Anesthesia Postprocedure Evaluation (Signed)
Anesthesia Post Note  Patient: Matthew Collier  Procedure(s) Performed: COLONOSCOPY WITH PROPOFOL (N/A ) ESOPHAGOGASTRODUODENOSCOPY (EGD) WITH PROPOFOL (N/A )  Patient location during evaluation: Endoscopy Anesthesia Type: General Level of consciousness: awake and alert Pain management: pain level controlled Vital Signs Assessment: post-procedure vital signs reviewed and stable Respiratory status: spontaneous breathing and respiratory function stable Cardiovascular status: stable Anesthetic complications: no     Last Vitals:  Vitals:   10/03/17 0715  BP: (!) 121/97  Pulse: 76  Resp: 16  Temp: (!) 36.1 C  SpO2: 100%    Last Pain:  Vitals:   10/03/17 0715  TempSrc: Tympanic  PainSc: 0-No pain                 KEPHART,WILLIAM K

## 2017-10-03 NOTE — Anesthesia Preprocedure Evaluation (Signed)
Anesthesia Evaluation  Patient identified by MRN, date of birth, ID band Patient awake    Reviewed: Allergy & Precautions, NPO status , Patient's Chart, lab work & pertinent test results  History of Anesthesia Complications Negative for: history of anesthetic complications  Airway Mallampati: II       Dental   Pulmonary neg sleep apnea, neg COPD,           Cardiovascular hypertension, Pt. on medications (-) Past MI and (-) CHF (-) dysrhythmias (-) Valvular Problems/Murmurs     Neuro/Psych neg Seizures    GI/Hepatic Neg liver ROS, neg GERD  ,  Endo/Other  diabetes, Type 2, Oral Hypoglycemic Agents  Renal/GU negative Renal ROS     Musculoskeletal   Abdominal   Peds  Hematology   Anesthesia Other Findings   Reproductive/Obstetrics                             Anesthesia Physical Anesthesia Plan  ASA: III  Anesthesia Plan: General   Post-op Pain Management:    Induction: Intravenous  PONV Risk Score and Plan: 2 and Propofol infusion and TIVA  Airway Management Planned: Nasal Cannula  Additional Equipment:   Intra-op Plan:   Post-operative Plan:   Informed Consent: I have reviewed the patients History and Physical, chart, labs and discussed the procedure including the risks, benefits and alternatives for the proposed anesthesia with the patient or authorized representative who has indicated his/her understanding and acceptance.     Plan Discussed with:   Anesthesia Plan Comments:         Anesthesia Quick Evaluation

## 2017-10-03 NOTE — H&P (Signed)
Vonda Antigua, MD [address], [place], [place], [place], [address] [address], [place], [place], [place], [address] Phone: [phone]  Fax: [phone]  Primary Care Physician:  Guadalupe Maple, MD   Pre-Procedure History & Physical: HPI:  Matthew Collier is a 52 y.o. male is here for a colonoscopy and EGD.   Past Medical History:  Diagnosis Date  . Diabetes mellitus without complication ([place])   . Hyperlipidemia   . Hypertension     Past Surgical History:  Procedure Laterality Date  . PLEURAL SCARIFICATION      Prior to Admission medications   Medication Sig Start Date End Date Taking? Authorizing Provider  benazepril (LOTENSIN) 40 MG tablet Take 1 tablet (40 mg total) by mouth daily. 10/31/16  Yes Crissman, Jeannette How, MD  Dapagliflozin-metFORMIN HCl ER (XIGDUO XR) 05-998 MG TB24 Take 1 tablet by mouth 2 (two) times daily. 03/17/17  Yes Johnson, Megan P, DO  glipiZIDE (GLUCOTROL) 5 MG tablet Take 5 mg by mouth daily as needed (high blood sugar).   Yes [provider]  simvastatin (ZOCOR) 40 MG tablet Take 1 tablet (40 mg total) by mouth daily. 10/31/16  Yes Crissman, Jeannette How, MD  Exenatide ER (BYDUREON) 2 MG PEN INJECT 1 PEN (2MG ) SUBCUTANEOUSLY ONCE WEEKLY. 10/31/16   Guadalupe Maple, MD  ONE TOUCH ULTRA TEST test strip USE AS DIRECTED. NEED NEW INSURANCE. 05/03/15   Guadalupe Maple, MD    Allergies as of 09/19/2017 - Review Complete 09/18/2017  Allergen Reaction Noted  . Penicillins  09/02/2014    Family History  Problem Relation Age of Onset  . Hypertension Mother   . Diabetes Mother   . Hypertension Father   . Cancer Father     Social History   Socioeconomic History  . Marital status: Married    Spouse name: Not on file  . Number of children: Not on file  . Years of education: Not on file  . Highest education level: Not on file  Occupational History  . Not on file  Social Needs  . Financial resource strain: Not on file  . Food  insecurity:    Worry: Not on file    Inability: Not on file  . Transportation needs:    Medical: Not on file    Non-medical: Not on file  Tobacco Use  . Smoking status: Never Smoker  . Smokeless tobacco: Never Used  Substance and Sexual Activity  . Alcohol use: Yes    Comment: occasional  . Drug use: No  . Sexual activity: Not on file  Lifestyle  . Physical activity:    Days per week: Not on file    Minutes per session: Not on file  . Stress: Not on file  Relationships  . Social connections:    Talks on phone: Not on file    Gets together: Not on file    Attends religious service: Not on file    Active member of club or organization: Not on file    Attends meetings of clubs or organizations: Not on file    Relationship status: Not on file  . Intimate partner violence:    Fear of current or ex partner: Not on file    Emotionally abused: Not on file    Physically abused: Not on file    Forced sexual activity: Not on file  Other Topics Concern  . Not on file  Social History Narrative  . Not on file  Review of Systems: See HPI, otherwise negative ROS  Physical Exam: BP (!) 121/97   Pulse 76   Temp (!) 96.9 F (36.1 C) (Tympanic)   Resp 16   SpO2 100%  General:   Alert,  pleasant and cooperative in NAD Head:  Normocephalic and atraumatic. Neck:  Supple; no masses or thyromegaly. Lungs:  Clear throughout to auscultation, normal respiratory effort.    Heart:  +S1, +S2, Regular rate and rhythm, No edema. Abdomen:  Soft, nontender and nondistended. Normal bowel sounds, without guarding, and without rebound.   Neurologic:  Alert and  oriented x4;  grossly normal neurologically.  Impression/Plan: Matthew Collier is here for a colonoscopy to be performed for average risk screening and EGD for hematochezia and drop in hemoglobin  Risks, benefits, limitations, and alternatives regarding the procedures have been reviewed with the patient.  Questions have been answered.  All  parties agreeable.   Virgel Manifold, MD  10/03/2017, 8:06 AM

## 2017-10-03 NOTE — Anesthesia Post-op Follow-up Note (Signed)
Anesthesia QCDR form completed.        

## 2017-10-03 NOTE — Op Note (Signed)
[facility] Gastroenterology Patient Name: Matthew Collier Procedure Date: 10/03/2017 8:09 AM MRN: [id number] Account #: [id number] Date of Birth: 17-Apr-1965 Admit Type: Outpatient Age: 52 Room: [facility] ENDO ROOM 2 Gender: Male Note Status: Finalized Procedure:            Colonoscopy Indications:          Screening for colorectal malignant neoplasm, History of                        diverticulitis Providers:            Varnita B. Bonna Gains MD, MD Referring MD:         Guadalupe Maple, MD (Referring MD) Medicines:            Monitored Anesthesia Care Complications:        No immediate complications. Procedure:            Pre-Anesthesia Assessment:                       - ASA Grade Assessment: II - A patient with mild                        systemic disease.                       - Prior to the procedure, a History and Physical was                        performed, and patient medications, allergies and                        sensitivities were reviewed. The patient's tolerance of                        previous anesthesia was reviewed.                       - The risks and benefits of the procedure and the                        sedation options and risks were discussed with the                        patient. All questions were answered and informed                        consent was obtained.                       - Patient identification and proposed procedure were                        verified prior to the procedure by the physician, the                        nurse, the anesthesiologist, the anesthetist and the                        technician. The procedure was verified in the procedure  room.                       After obtaining informed consent, the colonoscope was                        passed under direct vision. Throughout the procedure,                        the patient's blood pressure, pulse, and oxygen   saturations were monitored continuously. The                        Colonoscope was introduced through the anus and                        advanced to the the cecum, identified by appendiceal                        orifice and ileocecal valve. The colonoscopy was                        performed with ease. The patient tolerated the                        procedure well. The quality of the bowel preparation                        was good. Findings:      The perianal and digital rectal examinations were normal.      Three sessile polyps were found in the cecum. The polyps were 4 to 6 mm       in size. These polyps were removed with a cold snare. Resection and       retrieval were complete.      There was a medium-sized lipoma, 10 mm in diameter, in the ascending       colon. Biopsies were taken with a cold forceps for histology. Pillow       sign was positive and bright yellow material was seen on biopsies       consistent with a lipoma.      Five sessile polyps were found in the descending colon and transverse       colon. The polyps were 3 to 6 mm in size. These polyps were removed with       a cold snare. Resection and retrieval were complete.      Two semi-pedunculated polyps were found in the sigmoid colon. The polyps       were 8 to 10 mm in size. These polyps were removed with a hot snare.       Resection and retrieval were complete. These polyps were retrieved with       a roth net.      Three sessile polyps were found in the recto-sigmoid colon. The polyps       were 4 to 5 mm in size. These polyps were removed with a cold snare.       Resection and retrieval were complete.      Multiple diverticula were found in the sigmoid colon.      The exam was otherwise without abnormality.      The rectum, sigmoid colon, descending colon, transverse colon, ascending  colon and cecum appeared normal.      The retroflexed view of the distal rectum and anal verge was normal and        showed no anal or rectal abnormalities. Impression:           - Three 4 to 6 mm polyps in the cecum, removed with a                        cold snare. Resected and retrieved.                       - Medium-sized lipoma in the ascending colon. Biopsied.                       - Five 3 to 6 mm polyps in the descending colon and in                        the transverse colon, removed with a cold snare.                        Resected and retrieved.                       - Two 8 to 10 mm polyps in the sigmoid colon, removed                        with a hot snare. Resected and retrieved.                       - Three 4 to 5 mm polyps at the recto-sigmoid colon,                        removed with a cold snare. Resected and retrieved.                       - Diverticulosis in the sigmoid colon.                       - The examination was otherwise normal.                       - The rectum, sigmoid colon, descending colon,                        transverse colon, ascending colon and cecum are normal.                       - The distal rectum and anal verge are normal on                        retroflexion view. Recommendation:       - Discharge patient to home (with escort).                       - High fiber diet.                       - Advance diet as tolerated.                       -  Continue present medications.                       - Await pathology results.                       - Repeat colonoscopy in 2 years for surveillance due to                        more than 10 polyps removed on today's exam.                       - The findings and recommendations were discussed with                        the patient.                       - The findings and recommendations were discussed with                        the patient's family.                       - Return to primary care physician as previously                        scheduled. Procedure Code(s):    --- Professional ---                        [phone], Colonoscopy, flexible; with removal of tumor(s),                        polyp(s), or other lesion(s) by snare technique                       [id number], 38, Colonoscopy, flexible; with biopsy, single                        or multiple Diagnosis Code(s):    --- Professional ---                       Z12.11, Encounter for screening for malignant neoplasm                        of colon                       D12.0, Benign neoplasm of cecum                       D12.4, Benign neoplasm of descending colon                       D12.3, Benign neoplasm of transverse colon (hepatic                        flexure or splenic flexure)                       D12.5, Benign neoplasm of sigmoid colon  D12.7, Benign neoplasm of rectosigmoid junction                       D17.5, Benign lipomatous neoplasm of intra-abdominal                        organs                       K57.30, Diverticulosis of large intestine without                        perforation or abscess without bleeding CPT copyright 2017 American Medical Association. All rights reserved. The codes documented in this report are preliminary and upon coder review may  be revised to meet current compliance requirements.  Vonda Antigua, MD Margretta Sidle B. Bonna Gains MD, MD 10/03/2017 9:39:07 AM This report has been signed electronically. Number of Addenda: 0 Note Initiated On: 10/03/2017 8:09 AM Scope Withdrawal Time: 0 hours 35 minutes 58 seconds  Total Procedure Duration: 0 hours 43 minutes 34 seconds  Estimated Blood Loss: Estimated blood loss: none.      [facility]

## 2017-10-03 NOTE — Op Note (Addendum)
[facility] Gastroenterology Patient Name: Matthew Collier Procedure Date: 10/03/2017 8:09 AM MRN: [id number] Account #: [id number] Date of Birth: March 06, 1965 Admit Type: Outpatient Age: 52 Room: [facility] ENDO ROOM 2 Gender: Male Note Status: Finalized Procedure:            Upper GI endoscopy Indications:          Hematochezia Providers:            Tanita Palinkas B. Bonna Gains MD, MD Referring MD:         Guadalupe Maple, MD (Referring MD) Medicines:            Monitored Anesthesia Care Complications:        No immediate complications. Procedure:            Pre-Anesthesia Assessment:                       - Prior to the procedure, a History and Physical was                        performed, and patient medications, allergies and                        sensitivities were reviewed. The patient's tolerance of                        previous anesthesia was reviewed.                       - The risks and benefits of the procedure and the                        sedation options and risks were discussed with the                        patient. All questions were answered and informed                        consent was obtained.                       - Patient identification and proposed procedure were                        verified prior to the procedure by the physician, the                        nurse, the anesthesiologist, the anesthetist and the                        technician. The procedure was verified in the procedure                        room.                       - ASA Grade Assessment: II - A patient with mild                        systemic disease.                       After  obtaining informed consent, the endoscope was                        passed under direct vision. Throughout the procedure,                        the patient's blood pressure, pulse, and oxygen                        saturations were monitored continuously. The Endoscope   was introduced through the mouth, and advanced to the                        second part of duodenum. The upper GI endoscopy was                        accomplished with ease. The patient tolerated the                        procedure well. Findings:      The esophagus and gastroesophageal junction were examined with white       light. There were esophageal mucosal changes classified as Barrett's       stage C0-M2 per Prague criteria. These changes involved the mucosa at       the upper extent of the gastric folds (39 cm from the incisors)       extending to the Z-line (39 cm from the incisors). One tongue of       salmon-colored mucosa was present from 37 to 39 cm. The maximum       longitudinal extent of these esophageal mucosal changes was 2 cm in       length. Mucosa was biopsied with a cold forceps for histology in 4       quadrants at intervals of 1 cm. A total of 3 specimen bottles were sent       to pathology.      A single area of ectopic gastric mucosa was found in the proximal       esophagus, 17 cm from the incisors. Biopsies were taken with a cold       forceps for histology.      A few, 2 mm non-bleeding erosions were found in the gastric antrum.       There were no stigmata of recent bleeding. Biopsies were taken with a       cold forceps for histology.      Patchy mildly erythematous mucosa without bleeding was found in the       gastric antrum. Biopsies were taken with a cold forceps for histology.       Biopsies were obtained in the gastric body, at the incisura and in the       gastric antrum with cold forceps for histology.      The duodenal bulb, second portion of the duodenum and examined duodenum       were normal. Impression:           - Esophageal mucosal changes classified as Barrett's                        stage C0-M2 per Prague criteria. Biopsied.                       -  Ectopic gastric mucosa in the proximal esophagus.                        Biopsied.                        - Non-bleeding erosive gastropathy. Biopsied.                       - Erythematous mucosa in the antrum. Biopsied.                       - Normal duodenal bulb, second portion of the duodenum                        and examined duodenum.                       - Biopsies were obtained in the gastric body, at the                        incisura and in the gastric antrum. Recommendation:       - Avoid NSAIDs except Aspirin if medically indicated                       - Await pathology results.                       - Discharge patient to home (with escort).                       - Advance diet as tolerated.                       - Continue present medications.                       - Patient has a contact number available for                        emergencies. The signs and symptoms of potential                        delayed complications were discussed with the patient.                        Return to normal activities tomorrow. Written discharge                        instructions were provided to the patient.                       - Discharge patient to home (with escort).                       - The findings and recommendations were discussed with                        the patient.                       - The findings and recommendations were discussed with  the patient's family.                       - Follow an antireflux regimen.                       - Take prescribed proton pump inhibitor or H2 blocker                        (antacid) medications 30 - 60 minutes before meals. Procedure Code(s):    --- Professional ---                       [phone], Esophagogastroduodenoscopy, flexible, transoral;                        with biopsy, single or multiple Diagnosis Code(s):    --- Professional ---                       K22.70, Barrett's esophagus without dysplasia                       Q40.2, Other specified congenital malformations of                         stomach                       K31.89, Other diseases of stomach and duodenum                       K92.1, Melena (includes Hematochezia) CPT copyright 2017 American Medical Association. All rights reserved. The codes documented in this report are preliminary and upon coder review may  be revised to meet current compliance requirements.  Vonda Antigua, MD Margretta Sidle B. Bonna Gains MD, MD 10/03/2017 8:41:42 AM This report has been signed electronically. Number of Addenda: 0 Note Initiated On: 10/03/2017 8:09 AM Estimated Blood Loss: Estimated blood loss: none.      [facility]

## 2017-10-03 NOTE — Transfer of Care (Signed)
Immediate Anesthesia Transfer of Care Note  Patient: Matthew Collier  Procedure(s) Performed: COLONOSCOPY WITH PROPOFOL (N/A ) ESOPHAGOGASTRODUODENOSCOPY (EGD) WITH PROPOFOL (N/A )  Patient Location: PACU and Endoscopy Unit  Anesthesia Type:General  Level of Consciousness: sedated  Airway & Oxygen Therapy: Patient Spontanous Breathing  Post-op Assessment: Report given to RN and Post -op Vital signs reviewed and stable  Post vital signs: stable  Last Vitals:  Vitals Value Taken Time  BP    Temp    Pulse    Resp    SpO2      Last Pain:  Vitals:   10/03/17 0715  TempSrc: Tympanic  PainSc: 0-No pain         Complications: No apparent anesthesia complications

## 2017-10-06 ENCOUNTER — Encounter: Payer: Self-pay | Admitting: Gastroenterology

## 2017-10-07 LAB — SURGICAL PATHOLOGY

## 2017-10-09 ENCOUNTER — Telehealth: Payer: Self-pay

## 2017-10-09 NOTE — Telephone Encounter (Signed)
-----   Message from Virgel Manifold, MD sent at 10/06/2017  4:22 PM EDT ----- Jackelyn Poling please let patient know, his biopsies of his esophagus showed barrett's. This is what we had discussed after his procedure. It does not show dysplasia or cancer on the biopsies. We can repeat his EGD at the time of his next colonoscopy in 2 yrs for further biopsies if he is ok with that. He can follow up in clinic if he has any questions or any symptoms such as heartburn. He should use a bed wedge at night and avoid foods that cause reflux. Letter drafted for colonoscopy results.

## 2017-10-09 NOTE — Telephone Encounter (Signed)
Pt at work, wife, Jenny Reichmann on hippa. I went over the EGD results and recommendations. Also went over letter mailed for colonoscopy results. Will send recall letter for 2 yr EGD/Colonoscopy.

## 2017-10-27 ENCOUNTER — Ambulatory Visit: Payer: BLUE CROSS/BLUE SHIELD | Admitting: Family Medicine

## 2017-10-27 ENCOUNTER — Encounter: Payer: Self-pay | Admitting: Family Medicine

## 2017-10-27 VITALS — BP 109/68 | HR 82 | Temp 98.1°F | Ht 70.0 in | Wt 228.9 lb

## 2017-10-27 DIAGNOSIS — E119 Type 2 diabetes mellitus without complications: Secondary | ICD-10-CM

## 2017-10-27 DIAGNOSIS — E7800 Pure hypercholesterolemia, unspecified: Secondary | ICD-10-CM

## 2017-10-27 DIAGNOSIS — K922 Gastrointestinal hemorrhage, unspecified: Secondary | ICD-10-CM

## 2017-10-27 DIAGNOSIS — D500 Iron deficiency anemia secondary to blood loss (chronic): Secondary | ICD-10-CM

## 2017-10-27 DIAGNOSIS — K2270 Barrett's esophagus without dysplasia: Secondary | ICD-10-CM

## 2017-10-27 DIAGNOSIS — I10 Essential (primary) hypertension: Secondary | ICD-10-CM | POA: Diagnosis not present

## 2017-10-27 MED ORDER — DAPAGLIFLOZIN PRO-METFORMIN ER 5-1000 MG PO TB24: 1.0000 | ORAL_TABLET | Freq: Two times a day (BID) | ORAL | 1 refills | Status: DC

## 2017-10-27 MED ORDER — EXENATIDE ER 2 MG ~~LOC~~ PEN: PEN_INJECTOR | SUBCUTANEOUS | 1 refills | Status: DC

## 2017-10-27 MED ORDER — BENAZEPRIL HCL 40 MG PO TABS: 40.0000 mg | ORAL_TABLET | Freq: Every day | ORAL | 1 refills | Status: DC

## 2017-10-27 MED ORDER — FERROUS SULFATE 325 (65 FE) MG PO TABS: 325.0000 mg | ORAL_TABLET | Freq: Two times a day (BID) | ORAL | 6 refills | Status: DC

## 2017-10-27 MED ORDER — SIMVASTATIN 40 MG PO TABS: 40.0000 mg | ORAL_TABLET | Freq: Every day | ORAL | 1 refills | Status: DC

## 2017-10-27 MED ORDER — PANTOPRAZOLE SODIUM 20 MG PO TBEC: 20.0000 mg | DELAYED_RELEASE_TABLET | Freq: Every day | ORAL | 2 refills | Status: DC

## 2017-10-27 MED ORDER — GLIPIZIDE 5 MG PO TABS: 5.0000 mg | ORAL_TABLET | Freq: Every day | ORAL | 1 refills | Status: DC | PRN

## 2017-10-27 NOTE — Assessment & Plan Note (Signed)
The current medical regimen is effective;  continue present plan and medications.  

## 2017-10-27 NOTE — Assessment & Plan Note (Signed)
Check CBC today.  

## 2017-10-27 NOTE — Assessment & Plan Note (Signed)
-   We will continue Protonix. ?

## 2017-10-27 NOTE — Progress Notes (Signed)
BP 109/68 (BP Location: Left Arm, Patient Position: Sitting, Cuff Size: Normal)   Pulse 82   Temp 98.1 F (36.7 C) (Oral)   Ht 5\' 10"  (1.778 m)   Wt 228 lb 14.4 oz (103.8 kg)   SpO2 95%   BMI 32.84 kg/m    Subjective:    Patient ID: Matthew Collier, male    DOB: 05-Jul-1965, 52 y.o.   MRN: [id number]  HPI: Matthew Collier is a 52 y.o. male  Chief Complaint  Patient presents with  . Diabetes  Patient with a stormy fall was in the hospital for 3 days with GI bleeding.  Has been on Protonix for gastritis and Barrett's esophagitis also had multiple polyps and diverticulosis.  Patient feeling much better has had a stormy time controlling his diabetes is restarted glipizide and ready to get serious with his diet and nutrition weight loss. Reviewed colonoscopy gastroenterology results and biopsy reports.  Reviewed hemoglobin which was drifting lower.  Relevant past medical, surgical, family and social history reviewed and updated as indicated. Interim medical history since our last visit reviewed. Allergies and medications reviewed and updated.  Review of Systems  Constitutional: Negative.   Respiratory: Negative.   Cardiovascular: Negative.     Per HPI unless specifically indicated above     Objective:    BP 109/68 (BP Location: Left Arm, Patient Position: Sitting, Cuff Size: Normal)   Pulse 82   Temp 98.1 F (36.7 C) (Oral)   Ht 5\' 10"  (1.778 m)   Wt 228 lb 14.4 oz (103.8 kg)   SpO2 95%   BMI 32.84 kg/m   Wt Readings from Last 3 Encounters:  10/27/17 228 lb 14.4 oz (103.8 kg)  09/18/17 224 lb 3.2 oz (101.7 kg)  08/21/17 230 lb (104.3 kg)    Physical Exam  Constitutional: He is oriented to person, place, and time. He appears well-developed and well-nourished.  HENT:  Head: Normocephalic and atraumatic.  Eyes: Conjunctivae and EOM are normal.  Neck: Normal range of motion.  Cardiovascular: Normal rate, regular rhythm and normal heart sounds.  Pulmonary/Chest: Effort  normal and breath sounds normal.  Musculoskeletal: Normal range of motion.  Neurological: He is alert and oriented to person, place, and time.  Skin: No erythema.  Psychiatric: He has a normal mood and affect. His behavior is normal. Judgment and thought content normal.        Assessment & Plan:   Problem List Items Addressed This Visit      Cardiovascular and Mediastinum   Hypertension    The current medical regimen is effective;  continue present plan and medications.       Relevant Medications   simvastatin (ZOCOR) 40 MG tablet   benazepril (LOTENSIN) 40 MG tablet     Digestive   GIB (gastrointestinal bleeding)    Check CBC today      Barrett's esophagus without dysplasia    We will continue Protonix        Endocrine   Diabetes mellitus without complication ([place]) - Primary    Poor control will not change medications as by patient is going to do better with diet nutrition exercise.      Relevant Medications   Exenatide ER (BYDUREON) 2 MG PEN   Dapagliflozin-metFORMIN HCl ER (XIGDUO XR) 05-998 MG TB24   simvastatin (ZOCOR) 40 MG tablet   benazepril (LOTENSIN) 40 MG tablet   glipiZIDE (GLUCOTROL) 5 MG tablet   Other Relevant Orders   Urine Microalbumin w/creat. ratio  Bayer DCA Hb A1c Waived     Other   Hyperlipidemia    The current medical regimen is effective;  continue present plan and medications.       Relevant Medications   simvastatin (ZOCOR) 40 MG tablet   benazepril (LOTENSIN) 40 MG tablet    Other Visit Diagnoses    Iron deficiency anemia due to chronic blood loss       Relevant Medications   ferrous sulfate 325 (65 FE) MG tablet   Other Relevant Orders   CBC with Differential/Platelet       Follow up plan: Return in about 3 months (around 01/27/2018) for Physical Exam, Hemoglobin A1c.

## 2017-10-27 NOTE — Assessment & Plan Note (Signed)
Poor control will not change medications as by patient is going to do better with diet nutrition exercise.

## 2017-10-28 DIAGNOSIS — E119 Type 2 diabetes mellitus without complications: Secondary | ICD-10-CM | POA: Diagnosis not present

## 2017-10-28 LAB — CBC WITH DIFFERENTIAL/PLATELET
BASOS: 1 %
Basophils Absolute: 0.1 10*3/uL (ref 0.0–0.2)
EOS (ABSOLUTE): 0.1 10*3/uL (ref 0.0–0.4)
Eos: 1 %
HEMATOCRIT: 50.2 % (ref 37.5–51.0)
Hemoglobin: 17.6 g/dL (ref 13.0–17.7)
Immature Grans (Abs): 0.1 10*3/uL (ref 0.0–0.1)
Immature Granulocytes: 1 %
LYMPHS ABS: 1.7 10*3/uL (ref 0.7–3.1)
Lymphs: 23 %
MCH: 32.3 pg (ref 26.6–33.0)
MCHC: 35.1 g/dL (ref 31.5–35.7)
MCV: 92 fL (ref 79–97)
MONOS ABS: 0.7 10*3/uL (ref 0.1–0.9)
Monocytes: 9 %
NEUTROS ABS: 4.9 10*3/uL (ref 1.4–7.0)
NEUTROS PCT: 65 %
PLATELETS: 229 10*3/uL (ref 150–450)
RBC: 5.45 x10E6/uL (ref 4.14–5.80)
RDW: 12.5 % (ref 12.3–15.4)
WBC: 7.4 10*3/uL (ref 3.4–10.8)

## 2017-10-28 LAB — BAYER DCA HB A1C WAIVED: HB A1C: 7.7 % — AB (ref <7.0)

## 2017-10-29 ENCOUNTER — Encounter: Payer: Self-pay | Admitting: Family Medicine

## 2017-10-29 LAB — MICROALBUMIN / CREATININE URINE RATIO
Creatinine, Urine: 106.9 mg/dL
MICROALBUM., U, RANDOM: 4.8 ug/mL
Microalb/Creat Ratio: 4.5 mg/g creat (ref 0.0–30.0)

## 2018-02-05 ENCOUNTER — Other Ambulatory Visit: Payer: Self-pay

## 2018-02-05 ENCOUNTER — Encounter: Payer: Self-pay | Admitting: Family Medicine

## 2018-02-05 ENCOUNTER — Ambulatory Visit (INDEPENDENT_AMBULATORY_CARE_PROVIDER_SITE_OTHER): Payer: BLUE CROSS/BLUE SHIELD | Admitting: Family Medicine

## 2018-02-05 DIAGNOSIS — Z0000 Encounter for general adult medical examination without abnormal findings: Secondary | ICD-10-CM

## 2018-02-05 DIAGNOSIS — E119 Type 2 diabetes mellitus without complications: Secondary | ICD-10-CM | POA: Diagnosis not present

## 2018-02-05 DIAGNOSIS — E669 Obesity, unspecified: Secondary | ICD-10-CM

## 2018-02-05 DIAGNOSIS — D500 Iron deficiency anemia secondary to blood loss (chronic): Secondary | ICD-10-CM | POA: Diagnosis not present

## 2018-02-05 DIAGNOSIS — I10 Essential (primary) hypertension: Secondary | ICD-10-CM | POA: Diagnosis not present

## 2018-02-05 DIAGNOSIS — E7800 Pure hypercholesterolemia, unspecified: Secondary | ICD-10-CM

## 2018-02-05 DIAGNOSIS — K922 Gastrointestinal hemorrhage, unspecified: Secondary | ICD-10-CM

## 2018-02-05 DIAGNOSIS — E1159 Type 2 diabetes mellitus with other circulatory complications: Secondary | ICD-10-CM

## 2018-02-05 DIAGNOSIS — E1169 Type 2 diabetes mellitus with other specified complication: Secondary | ICD-10-CM

## 2018-02-05 LAB — URINALYSIS, ROUTINE W REFLEX MICROSCOPIC
Bilirubin, UA: NEGATIVE
Leukocytes, UA: NEGATIVE
Nitrite, UA: NEGATIVE
Protein, UA: NEGATIVE
RBC, UA: NEGATIVE
Specific Gravity, UA: 1.015 (ref 1.005–1.030)
Urobilinogen, Ur: 0.2 mg/dL (ref 0.2–1.0)
pH, UA: 5.5 (ref 5.0–7.5)

## 2018-02-05 LAB — BAYER DCA HB A1C WAIVED: HB A1C (BAYER DCA - WAIVED): 6.9 % (ref <7.0)

## 2018-02-05 MED ORDER — DAPAGLIFLOZIN PRO-METFORMIN ER 5-1000 MG PO TB24: 1.0000 | ORAL_TABLET | Freq: Two times a day (BID) | ORAL | 4 refills | Status: DC

## 2018-02-05 MED ORDER — BENAZEPRIL HCL 40 MG PO TABS: 40.0000 mg | ORAL_TABLET | Freq: Every day | ORAL | 4 refills | Status: DC

## 2018-02-05 MED ORDER — PANTOPRAZOLE SODIUM 20 MG PO TBEC: 20.0000 mg | DELAYED_RELEASE_TABLET | Freq: Every day | ORAL | 4 refills | Status: DC

## 2018-02-05 MED ORDER — EXENATIDE ER 2 MG ~~LOC~~ PEN: PEN_INJECTOR | SUBCUTANEOUS | 4 refills | Status: DC

## 2018-02-05 MED ORDER — GLIPIZIDE 5 MG PO TABS: 5.0000 mg | ORAL_TABLET | Freq: Every day | ORAL | 4 refills | Status: DC | PRN

## 2018-02-05 MED ORDER — SIMVASTATIN 40 MG PO TABS: 40.0000 mg | ORAL_TABLET | Freq: Every day | ORAL | 4 refills | Status: DC

## 2018-02-05 NOTE — Assessment & Plan Note (Signed)
The current medical regimen is effective;  continue present plan and medications.  

## 2018-02-05 NOTE — Assessment & Plan Note (Signed)
Patient stopped his iron will be checking CBC today last hemoglobin was back to normal patient feeling normal no further GI symptoms.

## 2018-02-05 NOTE — Progress Notes (Signed)
BP 125/84 (BP Location: Right Arm, Patient Position: Sitting, Cuff Size: Normal)   Pulse 79   Temp 98.2 F (36.8 C)   Ht 5' 8.31" (1.735 m)   Wt 231 lb 6 oz (105 kg)   SpO2 97%   BMI 34.86 kg/m    Subjective:    Patient ID: Matthew Collier, male    DOB: 02/10/1965, 53 y.o.   MRN: [id number]  HPI: Matthew Collier is a 53 y.o. male  Chief Complaint  Patient presents with  . Annual Exam  Patient all in all doing well no complaints. Has been through the holiday season with subsequent weight gain and concerned about diabetes but otherwise doing well no low blood sugar spells or problems blood pressure cholesterol doing well Protonix doing well for stomach.  Relevant past medical, surgical, family and social history reviewed and updated as indicated. Interim medical history since our last visit reviewed. Allergies and medications reviewed and updated.  Review of Systems  Constitutional: Negative.   HENT: Negative.   Eyes: Negative.   Respiratory: Negative.   Cardiovascular: Negative.   Gastrointestinal: Negative.   Endocrine: Negative.   Genitourinary: Negative.   Musculoskeletal: Negative.   Skin: Negative.   Allergic/Immunologic: Negative.   Neurological: Negative.   Hematological: Negative.   Psychiatric/Behavioral: Negative.     Per HPI unless specifically indicated above     Objective:    BP 125/84 (BP Location: Right Arm, Patient Position: Sitting, Cuff Size: Normal)   Pulse 79   Temp 98.2 F (36.8 C)   Ht 5' 8.31" (1.735 m)   Wt 231 lb 6 oz (105 kg)   SpO2 97%   BMI 34.86 kg/m   Wt Readings from Last 3 Encounters:  02/05/18 231 lb 6 oz (105 kg)  10/27/17 228 lb 14.4 oz (103.8 kg)  09/18/17 224 lb 3.2 oz (101.7 kg)    Physical Exam Constitutional:      Appearance: He is well-developed.  HENT:     Head: Normocephalic and atraumatic.     Right Ear: External ear normal.     Left Ear: External ear normal.  Eyes:     Conjunctiva/sclera: Conjunctivae normal.      Pupils: Pupils are equal, round, and reactive to light.  Neck:     Musculoskeletal: Normal range of motion and neck supple.  Cardiovascular:     Rate and Rhythm: Normal rate and regular rhythm.     Heart sounds: Normal heart sounds.  Pulmonary:     Effort: Pulmonary effort is normal.     Breath sounds: Normal breath sounds.  Abdominal:     General: Bowel sounds are normal.     Palpations: Abdomen is soft. There is no hepatomegaly or splenomegaly.  Genitourinary:    Penis: Normal.      Prostate: Normal.     Rectum: Normal.  Musculoskeletal: Normal range of motion.  Skin:    Findings: No erythema or rash.  Neurological:     Mental Status: He is alert and oriented to person, place, and time.     Deep Tendon Reflexes: Reflexes are normal and symmetric.  Psychiatric:        Behavior: Behavior normal.        Thought Content: Thought content normal.        Judgment: Judgment normal.     Results for orders placed or performed in visit on 10/30/17  HM DIABETES EYE EXAM  Result Value Ref Range   HM Diabetic Eye  Exam No Retinopathy No Retinopathy      Assessment & Plan:   Problem List Items Addressed This Visit      Cardiovascular and Mediastinum   Obesity, diabetes, and hypertension syndrome ([place])    The current medical regimen is effective;  continue present plan and medications.       Relevant Medications   Exenatide ER (BYDUREON) 2 MG PEN   Dapagliflozin-metFORMIN HCl ER (XIGDUO XR) 05-998 MG TB24   simvastatin (ZOCOR) 40 MG tablet   benazepril (LOTENSIN) 40 MG tablet   glipiZIDE (GLUCOTROL) 5 MG tablet   Hypertension    The current medical regimen is effective;  continue present plan and medications.       Relevant Medications   simvastatin (ZOCOR) 40 MG tablet   benazepril (LOTENSIN) 40 MG tablet   Other Relevant Orders   Comprehensive metabolic panel     Digestive   GIB (gastrointestinal bleeding)    Patient stopped his iron will be checking CBC today  last hemoglobin was back to normal patient feeling normal no further GI symptoms.        Other   Hyperlipidemia    The current medical regimen is effective;  continue present plan and medications.       Relevant Medications   simvastatin (ZOCOR) 40 MG tablet   benazepril (LOTENSIN) 40 MG tablet   Other Relevant Orders   Lipid Panel w/o Chol/HDL Ratio    Other Visit Diagnoses    Iron deficiency anemia due to chronic blood loss       Relevant Orders   CBC with Differential/Platelet   PE (physical exam), annual       Relevant Orders   PSA   TSH   Urinalysis, Routine w reflex microscopic       Follow up plan: Return in about 6 months (around 08/06/2018) for Hemoglobin A1c, BMP,  Lipids, ALT, AST.

## 2018-02-06 LAB — CBC WITH DIFFERENTIAL/PLATELET
BASOS: 1 %
Basophils Absolute: 0.1 10*3/uL (ref 0.0–0.2)
EOS (ABSOLUTE): 0.1 10*3/uL (ref 0.0–0.4)
EOS: 1 %
HEMATOCRIT: 48.3 % (ref 37.5–51.0)
Hemoglobin: 17.3 g/dL (ref 13.0–17.7)
IMMATURE GRANS (ABS): 0 10*3/uL (ref 0.0–0.1)
IMMATURE GRANULOCYTES: 1 %
LYMPHS: 19 %
Lymphocytes Absolute: 1.2 10*3/uL (ref 0.7–3.1)
MCH: 33 pg (ref 26.6–33.0)
MCHC: 35.8 g/dL — ABNORMAL HIGH (ref 31.5–35.7)
MCV: 92 fL (ref 79–97)
Monocytes Absolute: 0.6 10*3/uL (ref 0.1–0.9)
Monocytes: 9 %
NEUTROS PCT: 69 %
Neutrophils Absolute: 4.4 10*3/uL (ref 1.4–7.0)
PLATELETS: 209 10*3/uL (ref 150–450)
RBC: 5.25 x10E6/uL (ref 4.14–5.80)
RDW: 12.5 % (ref 11.6–15.4)
WBC: 6.4 10*3/uL (ref 3.4–10.8)

## 2018-02-06 LAB — COMPREHENSIVE METABOLIC PANEL
A/G RATIO: 1.8 (ref 1.2–2.2)
ALT: 43 IU/L (ref 0–44)
AST: 22 IU/L (ref 0–40)
Albumin: 4.6 g/dL (ref 3.8–4.9)
Alkaline Phosphatase: 59 IU/L (ref 39–117)
BUN/Creatinine Ratio: 19 (ref 9–20)
BUN: 14 mg/dL (ref 6–24)
Bilirubin Total: 0.6 mg/dL (ref 0.0–1.2)
CALCIUM: 9.5 mg/dL (ref 8.7–10.2)
CHLORIDE: 101 mmol/L (ref 96–106)
CO2: 21 mmol/L (ref 20–29)
CREATININE: 0.75 mg/dL — AB (ref 0.76–1.27)
GFR, EST AFRICAN AMERICAN: 120 mL/min/{1.73_m2} (ref >59)
GFR, EST NON AFRICAN AMERICAN: 104 mL/min/{1.73_m2} (ref >59)
Globulin, Total: 2.5 g/dL (ref 1.5–4.5)
Glucose: 153 mg/dL — ABNORMAL HIGH (ref 65–99)
POTASSIUM: 4.4 mmol/L (ref 3.5–5.2)
Sodium: 142 mmol/L (ref 134–144)
TOTAL PROTEIN: 7.1 g/dL (ref 6.0–8.5)

## 2018-02-06 LAB — LIPID PANEL W/O CHOL/HDL RATIO
Cholesterol, Total: 145 mg/dL (ref 100–199)
HDL: 34 mg/dL — ABNORMAL LOW (ref >39)
LDL CALC: 57 mg/dL (ref 0–99)
Triglycerides: 271 mg/dL — ABNORMAL HIGH (ref 0–149)
VLDL Cholesterol Cal: 54 mg/dL — ABNORMAL HIGH (ref 5–40)

## 2018-02-06 LAB — TSH: TSH: 0.677 u[IU]/mL (ref 0.450–4.500)

## 2018-02-06 LAB — PSA: Prostate Specific Ag, Serum: 1.6 ng/mL (ref 0.0–4.0)

## 2018-02-09 ENCOUNTER — Encounter: Payer: Self-pay | Admitting: Family Medicine

## 2018-08-06 ENCOUNTER — Ambulatory Visit: Payer: BLUE CROSS/BLUE SHIELD | Admitting: Family Medicine

## 2018-08-24 ENCOUNTER — Encounter: Payer: Self-pay | Admitting: Family Medicine

## 2018-08-24 ENCOUNTER — Other Ambulatory Visit: Payer: Self-pay

## 2018-08-24 ENCOUNTER — Ambulatory Visit (INDEPENDENT_AMBULATORY_CARE_PROVIDER_SITE_OTHER): Payer: BC Managed Care – PPO | Admitting: Family Medicine

## 2018-08-24 DIAGNOSIS — E785 Hyperlipidemia, unspecified: Secondary | ICD-10-CM

## 2018-08-24 DIAGNOSIS — E1169 Type 2 diabetes mellitus with other specified complication: Secondary | ICD-10-CM | POA: Diagnosis not present

## 2018-08-24 DIAGNOSIS — E669 Obesity, unspecified: Secondary | ICD-10-CM

## 2018-08-24 DIAGNOSIS — I10 Essential (primary) hypertension: Secondary | ICD-10-CM | POA: Diagnosis not present

## 2018-08-24 DIAGNOSIS — I152 Hypertension secondary to endocrine disorders: Secondary | ICD-10-CM

## 2018-08-24 DIAGNOSIS — E1159 Type 2 diabetes mellitus with other circulatory complications: Secondary | ICD-10-CM

## 2018-08-24 NOTE — Progress Notes (Signed)
There were no vitals taken for this visit.   Subjective:    Patient ID: Matthew Collier, male    DOB: June 13, 1965, 53 y.o.   MRN: [id number]  HPI: Matthew Collier is a 53 y.o. male  Med check  Relevant past medical, surgical, family and social history reviewed and updated as indicated. Interim medical history since our last visit reviewed. Allergies and medications reviewed and updated.  Review of Systems  Constitutional: Negative.   Respiratory: Negative.   Cardiovascular: Negative.     Per HPI unless specifically indicated above     Objective:    There were no vitals taken for this visit.  Wt Readings from Last 3 Encounters:  02/05/18 231 lb 6 oz (105 kg)  10/27/17 228 lb 14.4 oz (103.8 kg)  09/18/17 224 lb 3.2 oz (101.7 kg)    Physical Exam  Results for orders placed or performed in visit on 02/05/18  CBC with Differential/Platelet  Result Value Ref Range   WBC 6.4 3.4 - 10.8 x10E3/uL   RBC 5.25 4.14 - 5.80 x10E6/uL   Hemoglobin 17.3 13.0 - 17.7 g/dL   Hematocrit 48.3 37.5 - 51.0 %   MCV 92 79 - 97 fL   MCH 33.0 26.6 - 33.0 pg   MCHC 35.8 (H) 31.5 - 35.7 g/dL   RDW 12.5 11.6 - 15.4 %   Platelets 209 150 - 450 x10E3/uL   Neutrophils 69 Not Estab. %   Lymphs 19 Not Estab. %   Monocytes 9 Not Estab. %   Eos 1 Not Estab. %   Basos 1 Not Estab. %   Neutrophils Absolute 4.4 1.4 - 7.0 x10E3/uL   Lymphocytes Absolute 1.2 0.7 - 3.1 x10E3/uL   Monocytes Absolute 0.6 0.1 - 0.9 x10E3/uL   EOS (ABSOLUTE) 0.1 0.0 - 0.4 x10E3/uL   Basophils Absolute 0.1 0.0 - 0.2 x10E3/uL   Immature Granulocytes 1 Not Estab. %   Immature Grans (Abs) 0.0 0.0 - 0.1 x10E3/uL  Comprehensive metabolic panel  Result Value Ref Range   Glucose 153 (H) 65 - 99 mg/dL   BUN 14 6 - 24 mg/dL   Creatinine, Ser 0.75 (L) 0.76 - 1.27 mg/dL   GFR calc non Af Amer 104 >59 mL/min/1.73   GFR calc Af Amer 120 >59 mL/min/1.73   BUN/Creatinine Ratio 19 9 - 20   Sodium 142 134 - 144 mmol/L   Potassium 4.4 3.5  - 5.2 mmol/L   Chloride 101 96 - 106 mmol/L   CO2 21 20 - 29 mmol/L   Calcium 9.5 8.7 - 10.2 mg/dL   Total Protein 7.1 6.0 - 8.5 g/dL   Albumin 4.6 3.8 - 4.9 g/dL   Globulin, Total 2.5 1.5 - 4.5 g/dL   Albumin/Globulin Ratio 1.8 1.2 - 2.2   Bilirubin Total 0.6 0.0 - 1.2 mg/dL   Alkaline Phosphatase 59 39 - 117 IU/L   AST 22 0 - 40 IU/L   ALT 43 0 - 44 IU/L  Lipid Panel w/o Chol/HDL Ratio  Result Value Ref Range   Cholesterol, Total 145 100 - 199 mg/dL   Triglycerides 271 (H) 0 - 149 mg/dL   HDL 34 (L) >39 mg/dL   VLDL Cholesterol Cal 54 (H) 5 - 40 mg/dL   LDL Calculated 57 0 - 99 mg/dL  PSA  Result Value Ref Range   Prostate Specific Ag, Serum 1.6 0.0 - 4.0 ng/mL  TSH  Result Value Ref Range   TSH 0.677 0.450 -  4.500 uIU/mL  Urinalysis, Routine w reflex microscopic  Result Value Ref Range   Specific Gravity, UA 1.015 1.005 - 1.030   pH, UA 5.5 5.0 - 7.5   Color, UA Yellow Yellow   Appearance Ur Clear Clear   Leukocytes, UA Negative Negative   Protein, UA Negative Negative/Trace   Glucose, UA 2+ (A) Negative   Ketones, UA Trace (A) Negative   RBC, UA Negative Negative   Bilirubin, UA Negative Negative   Urobilinogen, Ur 0.2 0.2 - 1.0 mg/dL   Nitrite, UA Negative Negative  Bayer DCA Hb A1c Waived  Result Value Ref Range   HB A1C (BAYER DCA - WAIVED) 6.9 <7.0 %      Assessment & Plan:   Problem List Items Addressed This Visit      Cardiovascular and Mediastinum   Obesity, diabetes, and hypertension syndrome ([facility])    The current medical regimen is effective;  continue present plan and medications.       Relevant Orders   Bayer DCA Hb A1c Waived   Hypertension    The current medical regimen is effective;  continue present plan and medications.       Relevant Orders   Basic metabolic panel     Other   Hyperlipidemia    The current medical regimen is effective;  continue present plan and medications.       Relevant Orders   LP+ALT+AST [place], [employer]        Telemedicine using audio/video telecommunications for a synchronous communication visit. Today's visit due to COVID-19 isolation precautions I connected with and verified that I am speaking with the correct person using two identifiers.   I discussed the limitations, risks, security and privacy concerns of performing an evaluation and management service by telecommunication and the availability of in person appointments. I also discussed with the patient that there may be a patient responsible charge related to this service. The patient expressed understanding and agreed to proceed. The patient's location is home. I am at home.   I discussed the assessment and treatment plan with the patient. The patient was provided an opportunity to ask questions and all were answered. The patient agreed with the plan and demonstrated an understanding of the instructions.   The patient was advised to call back or seek an in-person evaluation if the symptoms worsen or if the condition fails to improve as anticipated.   I provided 21+ minutes of time during this encounter. Follow up plan: Return in about 6 months (around 02/24/2019) for Physical Exam, Hemoglobin A1c.

## 2018-08-24 NOTE — Assessment & Plan Note (Signed)
The current medical regimen is effective;  continue present plan and medications.  

## 2018-09-15 ENCOUNTER — Other Ambulatory Visit: Payer: BC Managed Care – PPO

## 2018-09-15 ENCOUNTER — Encounter: Payer: BC Managed Care – PPO | Admitting: Family Medicine

## 2018-09-15 ENCOUNTER — Other Ambulatory Visit: Payer: Self-pay

## 2018-09-15 DIAGNOSIS — E1159 Type 2 diabetes mellitus with other circulatory complications: Secondary | ICD-10-CM | POA: Diagnosis not present

## 2018-09-15 DIAGNOSIS — I10 Essential (primary) hypertension: Secondary | ICD-10-CM | POA: Diagnosis not present

## 2018-09-15 DIAGNOSIS — E785 Hyperlipidemia, unspecified: Secondary | ICD-10-CM

## 2018-09-15 DIAGNOSIS — E669 Obesity, unspecified: Secondary | ICD-10-CM

## 2018-09-15 DIAGNOSIS — E1169 Type 2 diabetes mellitus with other specified complication: Secondary | ICD-10-CM | POA: Diagnosis not present

## 2018-09-15 LAB — BAYER DCA HB A1C WAIVED: HB A1C (BAYER DCA - WAIVED): 9.5 % — ABNORMAL HIGH (ref <7.0)

## 2018-09-16 LAB — BASIC METABOLIC PANEL
BUN/Creatinine Ratio: 17 (ref 9–20)
BUN: 16 mg/dL (ref 6–24)
CO2: 20 mmol/L (ref 20–29)
Calcium: 9.2 mg/dL (ref 8.7–10.2)
Chloride: 99 mmol/L (ref 96–106)
Creatinine, Ser: 0.93 mg/dL (ref 0.76–1.27)
GFR calc Af Amer: 106 mL/min/{1.73_m2} (ref >59)
GFR calc non Af Amer: 92 mL/min/{1.73_m2} (ref >59)
Glucose: 348 mg/dL — ABNORMAL HIGH (ref 65–99)
Potassium: 5 mmol/L (ref 3.5–5.2)
Sodium: 137 mmol/L (ref 134–144)

## 2018-09-16 LAB — LIPID PANEL
Chol/HDL Ratio: 7 ratio — ABNORMAL HIGH (ref 0.0–5.0)
Cholesterol, Total: 223 mg/dL — ABNORMAL HIGH (ref 100–199)
HDL: 32 mg/dL — ABNORMAL LOW (ref >39)
Triglycerides: 995 mg/dL (ref 0–149)

## 2018-11-06 ENCOUNTER — Telehealth: Payer: Self-pay

## 2018-11-06 NOTE — Telephone Encounter (Signed)
PA submitted via cover my meds for Xigduo, medication approved until 11/06/19.

## 2018-11-09 ENCOUNTER — Telehealth: Payer: Self-pay | Admitting: Family Medicine

## 2018-11-09 NOTE — Telephone Encounter (Signed)
Pt wife called and stated that he needs a PA for medication Dapagliflozin-metFORMIN HCl ER (XIGDUO XR) 05-998 MG TB24 [id number]    Please advise

## 2018-11-10 NOTE — Telephone Encounter (Signed)
Patient's medication is approved.

## 2018-12-11 LAB — HM DIABETES EYE EXAM

## 2019-03-26 ENCOUNTER — Encounter: Payer: Self-pay | Admitting: Nurse Practitioner

## 2019-03-26 DIAGNOSIS — E1169 Type 2 diabetes mellitus with other specified complication: Secondary | ICD-10-CM | POA: Insufficient documentation

## 2019-03-29 ENCOUNTER — Encounter: Payer: Self-pay | Admitting: Nurse Practitioner

## 2019-03-29 ENCOUNTER — Ambulatory Visit (INDEPENDENT_AMBULATORY_CARE_PROVIDER_SITE_OTHER): Payer: BC Managed Care – PPO | Admitting: Nurse Practitioner

## 2019-03-29 ENCOUNTER — Other Ambulatory Visit: Payer: Self-pay

## 2019-03-29 VITALS — BP 132/84 | HR 96 | Ht 68.31 in | Wt 222.1 lb

## 2019-03-29 DIAGNOSIS — K2270 Barrett's esophagus without dysplasia: Secondary | ICD-10-CM

## 2019-03-29 DIAGNOSIS — I10 Essential (primary) hypertension: Secondary | ICD-10-CM | POA: Diagnosis not present

## 2019-03-29 DIAGNOSIS — E611 Iron deficiency: Secondary | ICD-10-CM | POA: Insufficient documentation

## 2019-03-29 DIAGNOSIS — E7800 Pure hypercholesterolemia, unspecified: Secondary | ICD-10-CM | POA: Insufficient documentation

## 2019-03-29 DIAGNOSIS — E1169 Type 2 diabetes mellitus with other specified complication: Secondary | ICD-10-CM | POA: Diagnosis not present

## 2019-03-29 DIAGNOSIS — Z6833 Body mass index (BMI) 33.0-33.9, adult: Secondary | ICD-10-CM

## 2019-03-29 DIAGNOSIS — E6609 Other obesity due to excess calories: Secondary | ICD-10-CM

## 2019-03-29 DIAGNOSIS — Z0000 Encounter for general adult medical examination without abnormal findings: Secondary | ICD-10-CM | POA: Diagnosis not present

## 2019-03-29 DIAGNOSIS — E1159 Type 2 diabetes mellitus with other circulatory complications: Secondary | ICD-10-CM

## 2019-03-29 DIAGNOSIS — E785 Hyperlipidemia, unspecified: Secondary | ICD-10-CM | POA: Diagnosis not present

## 2019-03-29 DIAGNOSIS — E669 Obesity, unspecified: Secondary | ICD-10-CM | POA: Insufficient documentation

## 2019-03-29 LAB — MICROALBUMIN, URINE WAIVED
Creatinine, Urine Waived: 50 mg/dL (ref 10–300)
Microalb, Ur Waived: 10 mg/L (ref 0–19)

## 2019-03-29 LAB — BAYER DCA HB A1C WAIVED: HB A1C (BAYER DCA - WAIVED): 8.6 % — ABNORMAL HIGH (ref <7.0)

## 2019-03-29 MED ORDER — XIGDUO XR 5-1000 MG PO TB24: 1.0000 | ORAL_TABLET | Freq: Two times a day (BID) | ORAL | 4 refills | Status: DC

## 2019-03-29 MED ORDER — BENAZEPRIL HCL 40 MG PO TABS: 40.0000 mg | ORAL_TABLET | Freq: Every day | ORAL | 4 refills | Status: DC

## 2019-03-29 MED ORDER — PANTOPRAZOLE SODIUM 20 MG PO TBEC: 20.0000 mg | DELAYED_RELEASE_TABLET | Freq: Every day | ORAL | 4 refills | Status: DC

## 2019-03-29 MED ORDER — BYDUREON 2 MG ~~LOC~~ PEN: PEN_INJECTOR | SUBCUTANEOUS | 4 refills | Status: DC

## 2019-03-29 MED ORDER — GLIPIZIDE 5 MG PO TABS: 5.0000 mg | ORAL_TABLET | Freq: Every day | ORAL | 4 refills | Status: DC | PRN

## 2019-03-29 MED ORDER — SIMVASTATIN 40 MG PO TABS: 40.0000 mg | ORAL_TABLET | Freq: Every day | ORAL | 4 refills | Status: DC

## 2019-03-29 NOTE — Assessment & Plan Note (Signed)
Chronic, ongoing.  Continue current medication regimen and adjust as needed. Lipid panel today. 

## 2019-03-29 NOTE — Assessment & Plan Note (Addendum)
Chronic, ongoing with BP at goal today.  Continue current medication with Benazepril for kidney protection, adjust as needed.  Recommend he monitor BP at home at least once a week and document.  Obtain labs today to include CMP.  Refills sent in.  Return in 3 months.

## 2019-03-29 NOTE — Patient Instructions (Signed)
Carbohydrate Counting for Diabetes Mellitus, Adult  Carbohydrate counting is a method of keeping track of how many carbohydrates you eat. Eating carbohydrates naturally increases the amount of sugar (glucose) in the blood. Counting how many carbohydrates you eat helps keep your blood glucose within normal limits, which helps you manage your diabetes (diabetes mellitus). It is important to know how many carbohydrates you can safely have in each meal. This is different for every person. A diet and nutrition specialist (registered dietitian) can help you make a meal plan and calculate how many carbohydrates you should have at each meal and snack. Carbohydrates are found in the following foods:  Grains, such as breads and cereals.  Dried beans and soy products.  Starchy vegetables, such as potatoes, peas, and corn.  Fruit and fruit juices.  Milk and yogurt.  Sweets and snack foods, such as cake, cookies, candy, chips, and soft drinks. How do I count carbohydrates? There are two ways to count carbohydrates in food. You can use either of the methods or a combination of both. Reading "Nutrition Facts" on packaged food The "Nutrition Facts" list is included on the labels of almost all packaged foods and beverages in the [place] It includes:  The serving size.  Information about nutrients in each serving, including the grams (g) of carbohydrate per serving. To use the "Nutrition Facts":  Decide how many servings you will have.  Multiply the number of servings by the number of carbohydrates per serving.  The resulting number is the total amount of carbohydrates that you will be having. Learning standard serving sizes of other foods When you eat carbohydrate foods that are not packaged or do not include "Nutrition Facts" on the label, you need to measure the servings in order to count the amount of carbohydrates:  Measure the foods that you will eat with a food scale or measuring cup, if  needed.  Decide how many standard-size servings you will eat.  Multiply the number of servings by 15. Most carbohydrate-rich foods have about 15 g of carbohydrates per serving. ? For example, if you eat 8 oz (170 g) of strawberries, you will have eaten 2 servings and 30 g of carbohydrates (2 servings x 15 g = 30 g).  For foods that have more than one food mixed, such as soups and casseroles, you must count the carbohydrates in each food that is included. The following list contains standard serving sizes of common carbohydrate-rich foods. Each of these servings has about 15 g of carbohydrates:   hamburger bun or  English muffin.   oz (15 mL) syrup.   oz (14 g) jelly.  1 slice of bread.  1 six-inch tortilla.  3 oz (85 g) cooked rice or pasta.  4 oz (113 g) cooked dried beans.  4 oz (113 g) starchy vegetable, such as peas, corn, or potatoes.  4 oz (113 g) hot cereal.  4 oz (113 g) mashed potatoes or  of a large baked potato.  4 oz (113 g) canned or frozen fruit.  4 oz (120 mL) fruit juice.  4-6 crackers.  6 chicken nuggets.  6 oz (170 g) unsweetened dry cereal.  6 oz (170 g) plain fat-free yogurt or yogurt sweetened with artificial sweeteners.  8 oz (240 mL) milk.  8 oz (170 g) fresh fruit or one small piece of fruit.  24 oz (680 g) popped popcorn. Example of carbohydrate counting Sample meal  3 oz (85 g) chicken breast.  6 oz (170 g)   brown rice.  4 oz (113 g) corn.  8 oz (240 mL) milk.  8 oz (170 g) strawberries with sugar-free whipped topping. Carbohydrate calculation 1. Identify the foods that contain carbohydrates: ? Rice. ? Corn. ? Milk. ? Strawberries. 2. Calculate how many servings you have of each food: ? 2 servings rice. ? 1 serving corn. ? 1 serving milk. ? 1 serving strawberries. 3. Multiply each number of servings by 15 g: ? 2 servings rice x 15 g = 30 g. ? 1 serving corn x 15 g = 15 g. ? 1 serving milk x 15 g = 15 g. ? 1  serving strawberries x 15 g = 15 g. 4. Add together all of the amounts to find the total grams of carbohydrates eaten: ? 30 g + 15 g + 15 g + 15 g = 75 g of carbohydrates total. Summary  Carbohydrate counting is a method of keeping track of how many carbohydrates you eat.  Eating carbohydrates naturally increases the amount of sugar (glucose) in the blood.  Counting how many carbohydrates you eat helps keep your blood glucose within normal limits, which helps you manage your diabetes.  A diet and nutrition specialist (registered dietitian) can help you make a meal plan and calculate how many carbohydrates you should have at each meal and snack. This information is not intended to replace advice given to you by your health care provider. Make sure you discuss any questions you have with your health care provider. Document Revised: 07/18/2016 Document Reviewed: 06/07/2015 [employer]vier Patient Education  2020 [employer]  

## 2019-03-29 NOTE — Progress Notes (Signed)
BP 132/84 (BP Location: Left Arm)   Pulse 96   Ht 5' 8.31" (1.735 m)   Wt 222 lb 2 oz (100.8 kg)   SpO2 98%   BMI 33.47 kg/m    Subjective:    Patient ID: Matthew Collier, male    DOB: September 30, 1965, 54 y.o.   MRN: [id number]  HPI: Matthew Collier is a 54 y.o. male presenting on 03/29/2019 for comprehensive medical examination. Current medical complaints include:none  He currently lives with: wife Interim Problems from his last visit: no   DIABETES Last A1C in September 9.5%.  Continues on Glipizide 5 MG as needed, Bydureon 2 MG weekly, and Xigduo 05-998 MG BID.   Does endorse missing occasional doses of Xigduo, many times takes once a day -- has been taking a Glipizide instead due to concerns of running out of medication the past months.  He reports in past when he consistently took Glipizide regularly it caused sugars in 60's. Hypoglycemic episodes:no Polydipsia/polyuria: no Visual disturbance: no Chest pain: no Paresthesias: no Glucose Monitoring: yes  Accucheck frequency: Daily  Fasting glucose: this morning 183 -- on average 200's  Post prandial: 204 a couple hours after breakfast today, on average a little higher  Evening:  Before meals: Taking Insulin?: no  Long acting insulin:  Short acting insulin: Blood Pressure Monitoring: not checking Retinal Examination: Up to Date Foot Exam: Up to Date Pneumovax: refused Influenza: refuses Aspirin: yes   HYPERTENSION / HYPERLIPIDEMIA Continues on Simvastatin and Benazepril.  Satisfied with current treatment? yes Duration of hypertension: chronic BP monitoring frequency: not checking BP range:  BP medication side effects: no Duration of hyperlipidemia: chronic Cholesterol medication side effects: no Cholesterol supplements: none Medication compliance: good compliance Aspirin: yes Recent stressors: no Recurrent headaches: no Visual changes: no Palpitations: no Dyspnea: no Chest pain: no Lower extremity edema: no  Dizzy/lightheaded: no  Functional Status Survey: Is the patient deaf or have difficulty hearing?: No Does the patient have difficulty seeing, even when wearing glasses/contacts?: No Does the patient have difficulty concentrating, remembering, or making decisions?: No Does the patient have difficulty walking or climbing stairs?: No Does the patient have difficulty dressing or bathing?: No Does the patient have difficulty doing errands alone such as visiting a doctor's office or shopping?: No  FALL RISK: Fall Risk  02/05/2018 02/03/2017 10/31/2016 04/24/2016 01/23/2016  Falls in the past year? 0 No No Yes No  Number falls in past yr: 0 - - - -  Injury with Fall? 0 - - - -    Depression Screen Depression screen [facility] 2/9 03/29/2019 02/05/2018 02/03/2017 10/31/2016 04/24/2016  Decreased Interest 0 0 0 0 0  Down, Depressed, Hopeless 0 0 0 0 0  PHQ - 2 Score 0 0 0 0 0  Altered sleeping 2 2 - - -  Tired, decreased energy 1 0 - - -  Change in appetite 1 1 - - -  Feeling bad or failure about yourself  0 0 - - -  Trouble concentrating 0 0 - - -  Moving slowly or fidgety/restless 0 0 - - -  Suicidal thoughts 0 0 - - -  PHQ-9 Score 4 3 - - -  Difficult doing work/chores - Not difficult at all - - -    Advanced Directives <no information>  Past Medical History:  Past Medical History:  Diagnosis Date  . Diabetes mellitus without complication ([place])   . Hyperlipidemia   . Hypertension     Surgical  History:  Past Surgical History:  Procedure Laterality Date  . COLONOSCOPY WITH PROPOFOL N/A 10/03/2017   Procedure: COLONOSCOPY WITH PROPOFOL;  Surgeon: Virgel Manifold, MD;  Location: [facility] ENDOSCOPY;  Service: Endoscopy;  Laterality: N/A;  . ESOPHAGOGASTRODUODENOSCOPY (EGD) WITH PROPOFOL N/A 10/03/2017   Procedure: ESOPHAGOGASTRODUODENOSCOPY (EGD) WITH PROPOFOL;  Surgeon: Virgel Manifold, MD;  Location: [facility] ENDOSCOPY;  Service: Endoscopy;  Laterality: N/A;  . PLEURAL SCARIFICATION       Medications:  Current Outpatient Medications on File Prior to Visit  Medication Sig  . ferrous sulfate 325 (65 FE) MG tablet Take 1 tablet (325 mg total) by mouth 2 (two) times daily with a meal.  . ONE TOUCH ULTRA TEST test strip USE AS DIRECTED. NEED NEW INSURANCE.   No current facility-administered medications on file prior to visit.    Allergies:  Allergies  Allergen Reactions  . Penicillins     Social History:  Social History   Socioeconomic History  . Marital status: Married    Spouse name: Not on file  . Number of children: Not on file  . Years of education: Not on file  . Highest education level: Not on file  Occupational History  . Not on file  Tobacco Use  . Smoking status: Never Smoker  . Smokeless tobacco: Never Used  Substance and Sexual Activity  . Alcohol use: Yes    Comment: occasional  . Drug use: No  . Sexual activity: Not on file  Other Topics Concern  . Not on file  Social History Narrative  . Not on file   Social Determinants of Health   Financial Resource Strain:   . Difficulty of Paying Living Expenses:   Food Insecurity:   . Worried About Charity fundraiser in the Last Year:   . Arboriculturist in the Last Year:   Transportation Needs:   . Film/video editor (Medical):   Marland Kitchen Lack of Transportation (Non-Medical):   Physical Activity:   . Days of Exercise per Week:   . Minutes of Exercise per Session:   Stress:   . Feeling of Stress :   Social Connections:   . Frequency of Communication with Friends and Family:   . Frequency of Social Gatherings with Friends and Family:   . Attends Religious Services:   . Active Member of Clubs or Organizations:   . Attends Archivist Meetings:   Marland Kitchen Marital Status:   Intimate Partner Violence:   . Fear of Current or Ex-Partner:   . Emotionally Abused:   Marland Kitchen Physically Abused:   . Sexually Abused:    Social History   Tobacco Use  Smoking Status Never Smoker  Smokeless Tobacco  Never Used   Social History   Substance and Sexual Activity  Alcohol Use Yes   Comment: occasional    Family History:  Family History  Problem Relation Age of Onset  . Hypertension Mother   . Diabetes Mother   . Hypertension Father   . Cancer Father     Past medical history, surgical history, medications, allergies, family history and social history reviewed with patient today and changes made to appropriate areas of the chart.   Review of Systems - negative All other ROS negative except what is listed above and in the HPI.      Objective:    BP 132/84 (BP Location: Left Arm)   Pulse 96   Ht 5' 8.31" (1.735 m)   Wt 222 lb 2 oz (  100.8 kg)   SpO2 98%   BMI 33.47 kg/m   Wt Readings from Last 3 Encounters:  03/29/19 222 lb 2 oz (100.8 kg)  02/05/18 231 lb 6 oz (105 kg)  10/27/17 228 lb 14.4 oz (103.8 kg)    Physical Exam Vitals and nursing note reviewed.  Constitutional:      General: He is awake. He is not in acute distress.    Appearance: He is well-developed. He is obese. He is not ill-appearing.  HENT:     Head: Normocephalic and atraumatic.     Right Ear: Hearing, tympanic membrane, ear canal and external ear normal. No drainage.     Left Ear: Hearing, tympanic membrane, ear canal and external ear normal. No drainage.     Nose: Nose normal.     Mouth/Throat:     Pharynx: Uvula midline.  Eyes:     General: Lids are normal.        Right eye: No discharge.        Left eye: No discharge.     Extraocular Movements: Extraocular movements intact.     Conjunctiva/sclera: Conjunctivae normal.     Pupils: Pupils are equal, round, and reactive to light.     Visual Fields: Right eye visual fields normal and left eye visual fields normal.  Neck:     Thyroid: No thyromegaly.     Vascular: No carotid bruit or JVD.     Trachea: Trachea normal.  Cardiovascular:     Rate and Rhythm: Normal rate and regular rhythm.     Heart sounds: Normal heart sounds, S1 normal and S2  normal. No murmur. No gallop.   Pulmonary:     Effort: Pulmonary effort is normal. No accessory muscle usage or respiratory distress.     Breath sounds: Normal breath sounds.  Abdominal:     General: Bowel sounds are normal.     Palpations: Abdomen is soft. There is no hepatomegaly or splenomegaly.     Tenderness: There is no abdominal tenderness.  Musculoskeletal:        General: Normal range of motion.     Cervical back: Normal range of motion and neck supple.     Right lower leg: No edema.     Left lower leg: No edema.  Lymphadenopathy:     Head:     Right side of head: No submental, submandibular, tonsillar, preauricular or posterior auricular adenopathy.     Left side of head: No submental, submandibular, tonsillar, preauricular or posterior auricular adenopathy.     Cervical: No cervical adenopathy.  Skin:    General: Skin is warm and dry.     Capillary Refill: Capillary refill takes less than 2 seconds.     Findings: No rash.  Neurological:     Mental Status: He is alert and oriented to person, place, and time.     Cranial Nerves: Cranial nerves are intact.     Gait: Gait is intact.     Deep Tendon Reflexes: Reflexes are normal and symmetric.     Reflex Scores:      Brachioradialis reflexes are 2+ on the right side and 2+ on the left side.      Patellar reflexes are 2+ on the right side and 2+ on the left side. Psychiatric:        Attention and Perception: Attention normal.        Mood and Affect: Mood normal.        Speech: Speech normal.  Behavior: Behavior normal. Behavior is cooperative.        Thought Content: Thought content normal.        Cognition and Memory: Cognition normal.        Judgment: Judgment normal.    Diabetic Foot Exam - Simple   Simple Foot Form Visual Inspection No deformities, no ulcerations, no other skin breakdown bilaterally: Yes Sensation Testing Intact to touch and monofilament testing bilaterally: Yes Pulse Check Posterior  Tibialis and Dorsalis pulse intact bilaterally: Yes Comments    Results for orders placed or performed in visit on 03/29/19  Bayer DCA Hb A1c Waived  Result Value Ref Range   HB A1C (BAYER DCA - WAIVED) 8.6 (H) <7.0 %  Microalbumin, Urine Waived  Result Value Ref Range   Microalb, Ur Waived 10 0 - 19 mg/L   Creatinine, Urine Waived 50 10 - 300 mg/dL   Microalb/Creat Ratio 30-300 (H) <30 mg/g      Assessment & Plan:   Problem List Items Addressed This Visit      Cardiovascular and Mediastinum   Hypertension associated with diabetes ([place])    Chronic, ongoing with BP at goal today.  Continue current medication with Benazepril for kidney protection, adjust as needed.  Recommend he monitor BP at home at least once a week and document.  Obtain labs today to include CMP.  Refills sent in.  Return in 3 months.      Relevant Medications   Dapagliflozin-metFORMIN HCl ER (XIGDUO XR) 05-998 MG TB24   Exenatide ER (BYDUREON) 2 MG PEN   benazepril (LOTENSIN) 40 MG tablet   simvastatin (ZOCOR) 40 MG tablet   glipiZIDE (GLUCOTROL) 5 MG tablet   Other Relevant Orders   Bayer DCA Hb A1c Waived (Completed)   Microalbumin, Urine Waived (Completed)   Comprehensive metabolic panel     Digestive   Barrett's esophagus without dysplasia    Chronic, ongoing.  Followed by GI in past.  Continue Protonix 20 MG daily and obtain Mag level today.  Adjust regimen as needed.      Relevant Orders   TSH   Magnesium     Endocrine   Type 2 diabetes mellitus with obesity (H[facility])    Chronic, ongoing.  A1C downward trend today at 8.6%, although still above goal <7%.  Discussed with patient at length.  Will continue current medication regimen at this time and recommend he consistently take Xigduo daily, no missing doses.  Refills sent on all medications.  Urine ALB 10 and A:C 30-300, continue Benazepril for kidney protection.  Recommended CCM referral for further guidance and education, he wishes to wait at this  time and refuses referral.  If continued elevation may need to consistently add Glipizide to regimen again and monitor for low sugars.  Recommend he monitor BS at least a few times per day and focus heavily on diet/exercise.  Return in 3 months.      Relevant Medications   Dapagliflozin-metFORMIN HCl ER (XIGDUO XR) 05-998 MG TB24   Exenatide ER (BYDUREON) 2 MG PEN   benazepril (LOTENSIN) 40 MG tablet   simvastatin (ZOCOR) 40 MG tablet   glipiZIDE (GLUCOTROL) 5 MG tablet   Other Relevant Orders   Bayer DCA Hb A1c Waived (Completed)   Comprehensive metabolic panel   Hyperlipidemia associated with type 2 diabetes mellitus (HCC)    Chronic, ongoing.  Continue current medication regimen and adjust as needed.  Lipid panel today.      Relevant Medications   Dapagliflozin-metFORMIN  HCl ER (XIGDUO XR) 05-998 MG TB24   Exenatide ER (BYDUREON) 2 MG PEN   benazepril (LOTENSIN) 40 MG tablet   simvastatin (ZOCOR) 40 MG tablet   glipiZIDE (GLUCOTROL) 5 MG tablet   Other Relevant Orders   Bayer DCA Hb A1c Waived (Completed)   Comprehensive metabolic panel   Lipid Panel w/o Chol/HDL Ratio     Other   Obese    Recommend continued focus on healthy diet choices and regular physical activity (30 minutes 5 days a week).  Focus on small goals and set timelines for these goals.       Relevant Medications   Dapagliflozin-metFORMIN HCl ER (XIGDUO XR) 05-998 MG TB24   Exenatide ER (BYDUREON) 2 MG PEN   glipiZIDE (GLUCOTROL) 5 MG tablet   Iron deficiency    Ongoing, continue daily supplement and check CBC + iron and ferritin levels today.  Return to GI as needed.  He is to have repeat colonoscopy in September.      Relevant Orders   Iron, TIBC and Ferritin Panel    Other Visit Diagnoses    Annual physical exam    -  Primary   Annual labs today CBC, CMP, TSH, lipid, PSA   Relevant Orders   PSA       Discussed aspirin prophylaxis for myocardial infarction prevention and decision was made to  continue ASA  LABORATORY TESTING:  Health maintenance labs ordered today as discussed above.   The natural history of prostate cancer and ongoing controversy regarding screening and potential treatment outcomes of prostate cancer has been discussed with the patient. The meaning of a false positive PSA and a false negative PSA has been discussed. He indicates understanding of the limitations of this screening test and wishes to proceed with screening PSA testing.   IMMUNIZATIONS:   - Tdap: Tetanus vaccination status reviewed: last tetanus booster within 10 years. - Influenza: Refused - Pneumovax: Refused - Prevnar: Refused - Zostavax vaccine: Refused  SCREENING: - Colonoscopy: Up to date  Discussed with patient purpose of the colonoscopy is to detect colon cancer at curable precancerous or early stages   - AAA Screening: Not applicable  -Hearing Test: Not applicable  -Spirometry: Not applicable   PATIENT COUNSELING:    Sexuality: Discussed sexually transmitted diseases, partner selection, use of condoms, avoidance of unintended pregnancy  and contraceptive alternatives.   Advised to avoid cigarette smoking.  I discussed with the patient that most people either abstain from alcohol or drink within safe limits (<=14/week and <=4 drinks/occasion for males, <=7/weeks and <= 3 drinks/occasion for females) and that the risk for alcohol disorders and other health effects rises proportionally with the number of drinks per week and how often a drinker exceeds daily limits.  Discussed cessation/primary prevention of drug use and availability of treatment for abuse.   Diet: Encouraged to adjust caloric intake to maintain  or achieve ideal body weight, to reduce intake of dietary saturated fat and total fat, to limit sodium intake by avoiding high sodium foods and not adding table salt, and to maintain adequate dietary potassium and calcium preferably from fresh fruits, vegetables, and low-fat  dairy products.    stressed the importance of regular exercise  Injury prevention: Discussed safety belts, safety helmets, smoke detector, smoking near bedding or upholstery.   Dental health: Discussed importance of regular tooth brushing, flossing, and dental visits.   Follow up plan: NEXT PREVENTATIVE PHYSICAL DUE IN 1 YEAR. Return in about 3 months (around 06/29/2019)  for T2DM, HTN/HLD.

## 2019-03-29 NOTE — Assessment & Plan Note (Signed)
Recommend continued focus on healthy diet choices and regular physical activity (30 minutes 5 days a week).  Focus on small goals and set timelines for these goals.

## 2019-03-29 NOTE — Assessment & Plan Note (Signed)
Ongoing, continue daily supplement and check CBC + iron and ferritin levels today.  Return to GI as needed.  He is to have repeat colonoscopy in September.

## 2019-03-29 NOTE — Assessment & Plan Note (Signed)
Chronic, ongoing.  A1C downward trend today at 8.6%, although still above goal <7%.  Discussed with patient at length.  Will continue current medication regimen at this time and recommend he consistently take Xigduo daily, no missing doses.  Refills sent on all medications.  Urine ALB 10 and A:C 30-300, continue Benazepril for kidney protection.  Recommended CCM referral for further guidance and education, he wishes to wait at this time and refuses referral.  If continued elevation may need to consistently add Glipizide to regimen again and monitor for low sugars.  Recommend he monitor BS at least a few times per day and focus heavily on diet/exercise.  Return in 3 months.

## 2019-03-29 NOTE — Assessment & Plan Note (Signed)
Chronic, ongoing.  Followed by GI in past.  Continue Protonix 20 MG daily and obtain Mag level today.  Adjust regimen as needed.

## 2019-03-30 LAB — TSH: TSH: 0.533 u[IU]/mL (ref 0.450–4.500)

## 2019-03-30 LAB — COMPREHENSIVE METABOLIC PANEL
ALT: 33 IU/L (ref 0–44)
AST: 23 IU/L (ref 0–40)
Albumin/Globulin Ratio: 2.1 (ref 1.2–2.2)
Albumin: 4.7 g/dL (ref 3.8–4.9)
Alkaline Phosphatase: 67 IU/L (ref 39–117)
BUN/Creatinine Ratio: 17 (ref 9–20)
BUN: 15 mg/dL (ref 6–24)
Bilirubin Total: 0.6 mg/dL (ref 0.0–1.2)
CO2: 21 mmol/L (ref 20–29)
Calcium: 9.7 mg/dL (ref 8.7–10.2)
Chloride: 101 mmol/L (ref 96–106)
Creatinine, Ser: 0.9 mg/dL (ref 0.76–1.27)
GFR calc Af Amer: 110 mL/min/{1.73_m2} (ref >59)
GFR calc non Af Amer: 95 mL/min/{1.73_m2} (ref >59)
Globulin, Total: 2.2 g/dL (ref 1.5–4.5)
Glucose: 194 mg/dL — ABNORMAL HIGH (ref 65–99)
Potassium: 4.3 mmol/L (ref 3.5–5.2)
Sodium: 141 mmol/L (ref 134–144)
Total Protein: 6.9 g/dL (ref 6.0–8.5)

## 2019-03-30 LAB — LIPID PANEL W/O CHOL/HDL RATIO
Cholesterol, Total: 227 mg/dL — ABNORMAL HIGH (ref 100–199)
HDL: 40 mg/dL (ref >39)
LDL Chol Calc (NIH): 103 mg/dL — ABNORMAL HIGH (ref 0–99)
Triglycerides: 494 mg/dL — ABNORMAL HIGH (ref 0–149)
VLDL Cholesterol Cal: 84 mg/dL — ABNORMAL HIGH (ref 5–40)

## 2019-03-30 LAB — PSA: Prostate Specific Ag, Serum: 1.5 ng/mL (ref 0.0–4.0)

## 2019-03-30 LAB — IRON,TIBC AND FERRITIN PANEL
Ferritin: 179 ng/mL (ref 30–400)
Iron Saturation: 44 % (ref 15–55)
Iron: 156 ug/dL (ref 38–169)
Total Iron Binding Capacity: 356 ug/dL (ref 250–450)
UIBC: 200 ug/dL (ref 111–343)

## 2019-03-30 LAB — MAGNESIUM: Magnesium: 2 mg/dL (ref 1.6–2.3)

## 2019-03-30 NOTE — Progress Notes (Signed)
Please let Mr. Newfield know his labs have returned: - Kidney, liver, prostate, thyroid, and magnesium are all normal - Iron level looks good and is normal - Cholesterol levels remain slightly elevated, are you taking Simvastatin daily?  Please ensure you do to help with control on this and if elevated next visit may consider addition of another medication to help with control.   If any questions let me know.  Have a great evening.

## 2019-05-28 ENCOUNTER — Ambulatory Visit: Payer: Self-pay | Attending: Internal Medicine

## 2019-07-02 ENCOUNTER — Ambulatory Visit: Payer: BC Managed Care – PPO | Admitting: Nurse Practitioner

## 2019-08-13 ENCOUNTER — Ambulatory Visit
Admission: RE | Admit: 2019-08-13 | Discharge: 2019-08-13 | Disposition: A | Discharge status: Home / Self Care | Payer: BC Managed Care – PPO | Source: Ambulatory Visit | Attending: Nurse Practitioner | Admitting: Nurse Practitioner

## 2019-08-13 ENCOUNTER — Encounter: Payer: Self-pay | Admitting: Nurse Practitioner

## 2019-08-13 ENCOUNTER — Telehealth: Payer: Self-pay | Admitting: *Deleted

## 2019-08-13 ENCOUNTER — Other Ambulatory Visit: Payer: Self-pay

## 2019-08-13 ENCOUNTER — Ambulatory Visit (INDEPENDENT_AMBULATORY_CARE_PROVIDER_SITE_OTHER): Payer: BC Managed Care – PPO | Admitting: Nurse Practitioner

## 2019-08-13 ENCOUNTER — Ambulatory Visit
Admission: RE | Admit: 2019-08-13 | Discharge: 2019-08-13 | Disposition: A | Discharge status: Home / Self Care | Payer: BC Managed Care – PPO | Attending: Nurse Practitioner | Admitting: Nurse Practitioner

## 2019-08-13 VITALS — BP 130/79 | HR 83 | Temp 98.3°F | Wt 221.4 lb

## 2019-08-13 DIAGNOSIS — E1159 Type 2 diabetes mellitus with other circulatory complications: Secondary | ICD-10-CM | POA: Diagnosis not present

## 2019-08-13 DIAGNOSIS — M25552 Pain in left hip: Secondary | ICD-10-CM | POA: Diagnosis not present

## 2019-08-13 DIAGNOSIS — E785 Hyperlipidemia, unspecified: Secondary | ICD-10-CM

## 2019-08-13 DIAGNOSIS — Z9114 Patient's other noncompliance with medication regimen: Secondary | ICD-10-CM

## 2019-08-13 DIAGNOSIS — E669 Obesity, unspecified: Secondary | ICD-10-CM

## 2019-08-13 DIAGNOSIS — E6609 Other obesity due to excess calories: Secondary | ICD-10-CM

## 2019-08-13 DIAGNOSIS — E1169 Type 2 diabetes mellitus with other specified complication: Secondary | ICD-10-CM | POA: Diagnosis not present

## 2019-08-13 DIAGNOSIS — Z6833 Body mass index (BMI) 33.0-33.9, adult: Secondary | ICD-10-CM

## 2019-08-13 DIAGNOSIS — I10 Essential (primary) hypertension: Secondary | ICD-10-CM

## 2019-08-13 LAB — BAYER DCA HB A1C WAIVED: HB A1C (BAYER DCA - WAIVED): 9.7 % — ABNORMAL HIGH (ref <7.0)

## 2019-08-13 MED ORDER — GABAPENTIN 100 MG PO CAPS: 300.0000 mg | ORAL_CAPSULE | Freq: Three times a day (TID) | ORAL | 3 refills | Status: DC

## 2019-08-13 MED ORDER — CYCLOBENZAPRINE HCL 10 MG PO TABS
10.0000 mg | ORAL_TABLET | Freq: Three times a day (TID) | ORAL | 0 refills | Status: DC | PRN
Start: 2019-08-13 — End: 2020-02-21

## 2019-08-13 NOTE — Chronic Care Management (AMB) (Signed)
  Care Management   Outreach Note  08/13/2019 Name: [name] MRN: [id number] DOB: 16-Apr-1965  Matthew Collier is a 54 y.o. year old male who is a primary care patient of Cannady, Barbaraann Faster, NP. I reached out to Matthew Collier by phone today in response to a referral sent by Mr. Oseas Detty Garfield's PCP,Cannady, Barbaraann Faster, NP .     An unsuccessful telephone outreach was attempted today. The patient was referred to the case management team for assistance with care management and care coordination.   Follow Up Plan: A HIPPA compliant phone message was left for the patient providing contact information and requesting a return call. The care management team will reach out to the patient again over the next 7 days. If patient returns call to provider office, please advise to call [place] at [phone].  [place], [place] [address] Direct Dial: [phone] [email or web address] Website: [email or web address]

## 2019-08-13 NOTE — Progress Notes (Signed)
Contacted via [place] afternoon Matthew Collier, your imaging has come back.  Hip imaging was negative.  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  I suspect this is where discomfort is coming from and sciatic pain.  We will see if Gabapentin offers benefit.  If ongoing pain then let me know and we may want to pursue physical therapy and a visit with orthopedics.  The back imaging also noted some stool in colon, I recommend increasing fluid intake and taking some Colace as needed for constipation.  Any questions?

## 2019-08-13 NOTE — Assessment & Plan Note (Signed)
Chronic, ongoing.  Continue current medication regimen and adjust as needed.  Lipid panel today.  Recommend he consistently take statin, as currently not doing so and last levels above goal. 

## 2019-08-13 NOTE — Assessment & Plan Note (Signed)
Chronic, ongoing.  A1C upward trend today at 9.7%, still above goal <7% which was discussed with him.  Discussed with patient at length.  Will continue current medication regimen at this time and recommend he consistently take Xigduo daily, no missing doses.  Urine ALB 10 and A:C 30-300 last visit, continue Benazepril for kidney protection.  CCM referral placed for further guidance and education + possible assistance due to cost concerns.  Goal is transition off Bydureon and change to Trulicity, which will allow for higher doses -- will see if cost effective option for patient.  Recommend he monitor BS at least a few times per day and focus heavily on diet/exercise.  Return in 3 months, sooner if medication changes.

## 2019-08-13 NOTE — Patient Instructions (Addendum)
Imaging -- [place], [place] [address]  Hip Pain The hip is the joint between the upper legs and the lower pelvis. The bones, cartilage, tendons, and muscles of your hip joint support your body and allow you to move around. Hip pain can range from a minor ache to severe pain in one or both of your hips. The pain may be felt on the inside of the hip joint near the groin, or on the outside near the buttocks and upper thigh. You may also have swelling or stiffness in your hip area. Follow these instructions at home: Managing pain, stiffness, and swelling      If directed, put ice on the painful area. To do this: ? Put ice in a plastic bag. ? Place a towel between your skin and the bag. ? Leave the ice on for 20 minutes, 2-3 times a day.  If directed, apply heat to the affected area as often as told by your health care provider. Use the heat source that your health care provider recommends, such as a moist heat pack or a heating pad. ? Place a towel between your skin and the heat source. ? Leave the heat on for 20-30 minutes. ? Remove the heat if your skin turns bright red. This is especially important if you are unable to feel pain, heat, or cold. You may have a greater risk of getting burned. Activity  Do exercises as told by your health care provider.  Avoid activities that cause pain. General instructions   Take over-the-counter and prescription medicines only as told by your health care provider.  Keep a journal of your symptoms. Write down: ? How often you have hip pain. ? The location of your pain. ? What the pain feels like. ? What makes the pain worse.  Sleep with a pillow between your legs on your most comfortable side.  Keep all follow-up visits as told by your health care provider. This is important. Contact a health care provider if:  You cannot put weight on your leg.  Your pain or swelling continues or gets worse after one week.  It gets harder to  walk.  You have a fever. Get help right away if:  You fall.  You have a sudden increase in pain and swelling in your hip.  Your hip is red or swollen or very tender to touch. Summary  Hip pain can range from a minor ache to severe pain in one or both of your hips.  The pain may be felt on the inside of the hip joint near the groin, or on the outside near the buttocks and upper thigh.  Avoid activities that cause pain.  Write down how often you have hip pain, the location of the pain, what makes it worse, and what it feels like. This information is not intended to replace advice given to you by your health care provider. Make sure you discuss any questions you have with your health care provider. Document Revised: 05/11/2018 Document Reviewed: 05/11/2018 Elsevier Patient Education  [place].

## 2019-08-13 NOTE — Assessment & Plan Note (Signed)
BMI 33.36 today.  Recommended eating smaller high protein, low fat meals more frequently and exercising 30 mins a day 5 times a week with a goal of 10-15lb weight loss in the next 3 months. Patient voiced their understanding and motivation to adhere to these recommendations.

## 2019-08-13 NOTE — Progress Notes (Signed)
BP 130/79 (BP Location: Left Arm, Cuff Size: Normal)   Pulse 83   Temp 98.3 F (36.8 C) (Oral)   Wt 221 lb 6.4 oz (100.4 kg)   SpO2 98%   BMI 33.36 kg/m    Subjective:    Patient ID: Matthew Collier, male    DOB: 25-Jan-1965, 54 y.o.   MRN: [id number]  HPI: Matthew Collier is a 54 y.o. male  Chief Complaint  Patient presents with  . Diabetes  . Hyperlipidemia  . Hypertension  . Hip Pain    pt states he has been having pain in his left hip that runs down into his leg for the past week    DIABETES Last A1C in March 8.6%, which was downward trend from previous of 9.5%. Continues on Glipizide 5 MG as needed, Bydureon 2 MG weekly, and Xigduo 05-998 MG BID.   Endorsed missing occasional doses of Xigduo last visit, many times takes once a day -- had been taking a Glipizide instead due to concerns of running out of medication the past months.  He reports in past when he consistently took Glipizide regularly it caused sugars in 60's.  Merleen Nicely was $500 a month, but currently using discount card. Hypoglycemic episodes:no Polydipsia/polyuria: no Visual disturbance: no Chest pain: no Paresthesias: no Glucose Monitoring: yes             Accucheck frequency: has not checked in awhile             Fasting glucose:              Post prandial:              Evening:             Before meals: Taking Insulin?: no             Long acting insulin:             Short acting insulin: Blood Pressure Monitoring: not checking Retinal Examination: Up to Date Foot Exam: Up to Date Pneumovax: refused Influenza: refused Aspirin: yes   HYPERTENSION / HYPERLIPIDEMIA Continues on Simvastatin and Benazepril. Does endorse missing doses of Simvastatin, takes about 4 times a week.   Satisfied with current treatment? yes Duration of hypertension: chronic BP monitoring frequency: not checking BP range:  BP medication side effects: no Duration of hyperlipidemia: chronic Cholesterol medication side effects:  no Cholesterol supplements: none Medication compliance: good compliance Aspirin: yes Recent stressors: no Recurrent headaches: no Visual changes: no Palpitations: no Dyspnea: no Chest pain: no Lower extremity edema: no Dizzy/lightheaded: no The 10-year ASCVD risk score [name], et al., 2013) is: 18.6%   Values used to calculate the score:     Age: 72 years     Sex: Male     Is Non-Hispanic African American: No     Diabetic: Yes     Tobacco smoker: No     Systolic Blood Pressure: 382 mmHg     Is BP treated: Yes     HDL Cholesterol: 40 mg/dL     Total Cholesterol: 227 mg/dL  HIP PAIN Started one week ago with pain in left hip, running down left leg.  Started with a little pain last weekend, then more he stood on it at work the worse it became.  Yesterday he had pain running down leg and had to leave work.  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  Lots of walking on cement  floors.  Gait has changed some with pain. Duration: days Involved hip: left  Mechanism of injury: unknown Location: posterior Onset: sudden  Severity: 8/10  Quality: burning, shooting and throbbing Frequency: intermittent Radiation: yes Aggravating factors: weight bearing, bending and movement   Alleviating factors: heating pad, ice and NSAIDs  Status: fluctuating Treatments attempted: ice, heat and ibuprofen   Relief with NSAIDs?: moderate Weakness with weight bearing: no Weakness with walking: no Paresthesias / decreased sensation: no Swelling: no Redness:no Fevers: no  Relevant past medical, surgical, family and social history reviewed and updated as indicated. Interim medical history since our last visit reviewed. Allergies and medications reviewed and updated.  Review of Systems  Constitutional: Negative for activity change, diaphoresis, fatigue and fever.  Respiratory: Negative for cough, chest tightness, shortness of breath and wheezing.   Cardiovascular:  Negative for chest pain, palpitations and leg swelling.  Gastrointestinal: Negative.   Endocrine: Negative for cold intolerance, heat intolerance, polydipsia, polyphagia and polyuria.  Musculoskeletal: Positive for arthralgias.  Neurological: Negative.   Psychiatric/Behavioral: Negative.     Per HPI unless specifically indicated above     Objective:    BP 130/79 (BP Location: Left Arm, Cuff Size: Normal)   Pulse 83   Temp 98.3 F (36.8 C) (Oral)   Wt 221 lb 6.4 oz (100.4 kg)   SpO2 98%   BMI 33.36 kg/m   Wt Readings from Last 3 Encounters:  08/13/19 221 lb 6.4 oz (100.4 kg)  03/29/19 222 lb 2 oz (100.8 kg)  02/05/18 231 lb 6 oz (105 kg)    Physical Exam Vitals and nursing note reviewed.  Constitutional:      General: He is awake.     Appearance: He is well-developed and well-groomed. He is obese.  HENT:     Head: Normocephalic and atraumatic.     Right Ear: Hearing normal. No drainage.     Left Ear: Hearing normal. No drainage.  Eyes:     General: Lids are normal.        Right eye: No discharge.        Left eye: No discharge.     Conjunctiva/sclera: Conjunctivae normal.     Pupils: Pupils are equal, round, and reactive to light.  Neck:     Thyroid: No thyromegaly.     Vascular: No carotid bruit.     Trachea: Trachea normal.  Cardiovascular:     Rate and Rhythm: Normal rate and regular rhythm.     Heart sounds: Normal heart sounds, S1 normal and S2 normal. No murmur heard.  No gallop.   Pulmonary:     Effort: Pulmonary effort is normal. No accessory muscle usage or respiratory distress.     Breath sounds: Normal breath sounds.  Abdominal:     General: Bowel sounds are normal.     Palpations: Abdomen is soft.     Tenderness: There is no abdominal tenderness.  Musculoskeletal:        General: Normal range of motion.     Cervical back: Normal range of motion and neck supple.     Lumbar back: No swelling, edema, deformity, lacerations or tenderness. Normal range  of motion. Negative right straight leg raise test and negative left straight leg raise test. No scoliosis.     Right hip: Normal.     Left hip: No deformity, lacerations, tenderness or crepitus. Normal range of motion. Normal strength.     Right lower leg: No edema.     Left lower leg:  No edema.     Comments: Mild antalgic gait noted.  Skin:    General: Skin is warm and dry.     Capillary Refill: Capillary refill takes less than 2 seconds.     Findings: No rash.  Neurological:     Mental Status: He is alert and oriented to person, place, and time.     Deep Tendon Reflexes: Reflexes are normal and symmetric.     Reflex Scores:      Brachioradialis reflexes are 2+ on the right side and 2+ on the left side.      Patellar reflexes are 2+ on the right side and 2+ on the left side. Psychiatric:        Attention and Perception: Attention normal.        Mood and Affect: Mood normal.        Speech: Speech normal.        Behavior: Behavior normal. Behavior is cooperative.        Thought Content: Thought content normal.     Results for orders placed or performed in visit on 03/29/19  Bayer DCA Hb A1c Waived  Result Value Ref Range   HB A1C (BAYER DCA - WAIVED) 8.6 (H) <7.0 %  Microalbumin, Urine Waived  Result Value Ref Range   Microalb, Ur Waived 10 0 - 19 mg/L   Creatinine, Urine Waived 50 10 - 300 mg/dL   Microalb/Creat Ratio 30-300 (H) <30 mg/g  Comprehensive metabolic panel  Result Value Ref Range   Glucose 194 (H) 65 - 99 mg/dL   BUN 15 6 - 24 mg/dL   Creatinine, Ser 0.90 0.76 - 1.27 mg/dL   GFR calc non Af Amer 95 >59 mL/min/1.73   GFR calc Af Amer 110 >59 mL/min/1.73   BUN/Creatinine Ratio 17 9 - 20   Sodium 141 134 - 144 mmol/L   Potassium 4.3 3.5 - 5.2 mmol/L   Chloride 101 96 - 106 mmol/L   CO2 21 20 - 29 mmol/L   Calcium 9.7 8.7 - 10.2 mg/dL   Total Protein 6.9 6.0 - 8.5 g/dL   Albumin 4.7 3.8 - 4.9 g/dL   Globulin, Total 2.2 1.5 - 4.5 g/dL   Albumin/Globulin Ratio  2.1 1.2 - 2.2   Bilirubin Total 0.6 0.0 - 1.2 mg/dL   Alkaline Phosphatase 67 39 - 117 IU/L   AST 23 0 - 40 IU/L   ALT 33 0 - 44 IU/L  Lipid Panel w/o Chol/HDL Ratio  Result Value Ref Range   Cholesterol, Total 227 (H) 100 - 199 mg/dL   Triglycerides 494 (H) 0 - 149 mg/dL   HDL 40 >39 mg/dL   VLDL Cholesterol Cal 84 (H) 5 - 40 mg/dL   LDL Chol Calc (NIH) 103 (H) 0 - 99 mg/dL  PSA  Result Value Ref Range   Prostate Specific Ag, Serum 1.5 0.0 - 4.0 ng/mL  TSH  Result Value Ref Range   TSH 0.533 0.450 - 4.500 uIU/mL  Magnesium  Result Value Ref Range   Magnesium 2.0 1.6 - 2.3 mg/dL  Iron, TIBC and Ferritin Panel  Result Value Ref Range   Total Iron Binding Capacity 356 250 - 450 ug/dL   UIBC 200 111 - 343 ug/dL   Iron 156 38 - 169 ug/dL   Iron Saturation 44 15 - 55 %   Ferritin 179 30.0 - 400.0 ng/mL      Assessment & Plan:   Problem List Items Addressed This Visit  Cardiovascular and Mediastinum   Hypertension associated with diabetes ([place])    Chronic, ongoing with BP at goal today.  Continue current medication with Benazepril for kidney protection, adjust as needed.  Recommend he monitor BP at home at least once a week and document, plus bring to visits.  DASH diet focus recommended.  Obtain labs today to include BMP.  Return in 3 months.      Relevant Orders   Bayer DCA Hb A1c Waived   Basic metabolic panel   Referral to Chronic Care Management Services     Endocrine   Type 2 diabetes mellitus with obesity ([place]) - Primary    Chronic, ongoing.  A1C upward trend today at 9.7%, still above goal <7% which was discussed with him.  Discussed with patient at length.  Will continue current medication regimen at this time and recommend he consistently take Xigduo daily, no missing doses.  Urine ALB 10 and A:C 30-300 last visit, continue Benazepril for kidney protection.  CCM referral placed for further guidance and education + possible assistance due to cost concerns.  Goal  is transition off Bydureon and change to Trulicity, which will allow for higher doses -- will see if cost effective option for patient.  Recommend he monitor BS at least a few times per day and focus heavily on diet/exercise.  Return in 3 months, sooner if medication changes.      Relevant Orders   Bayer DCA Hb A1c Waived   Basic metabolic panel   Referral to Chronic Care Management Services   Hyperlipidemia associated with type 2 diabetes mellitus ([facility])    Chronic, ongoing.  Continue current medication regimen and adjust as needed.  Lipid panel today.  Recommend he consistently take statin, as currently not doing so and last levels above goal.      Relevant Orders   Bayer DCA Hb A1c Waived   Lipid Panel w/o Chol/HDL Ratio   Referral to Chronic Care Management Services     Other   Obese    BMI 33.36 today.  Recommended eating smaller high protein, low fat meals more frequently and exercising 30 mins a day 5 times a week with a goal of 10-15lb weight loss in the next 3 months. Patient voiced their understanding and motivation to adhere to these recommendations.       Poor compliance with medication    CCM referral placed to assist in increasing compliance and possible cost reduction.      Acute hip pain, left    Acute, suspect more from lower back then hip.  Will obtain imaging to further assess left hip and lumbar spine.  Patient agreeable to this.  Will avoid Prednisone at this time due to poor diabetes control.  Scripts for Gabapentin and Flexeril sent, educated him on medications and use.  Discussed that if pain improves he could slowly transition himself off Gabapentin.  Recommend use of Tylenol at home as needed, avoid NSAIDs.  May use Diclofenac or Biofreeze.  Continue to rest and alternate heat/ice.  Work excuse note provided.  Return to office for worsening or ongoing pain, if ongoing will consider PT referral.      Relevant Orders   DG Lumbar Spine Complete   DG Hip Unilat W  OR W/O Pelvis 1V Left       Follow up plan: Return in about 3 months (around 11/13/2019) for T2DM, HTN/HLD.

## 2019-08-13 NOTE — Assessment & Plan Note (Signed)
Acute, suspect more from lower back then hip.  Will obtain imaging to further assess left hip and lumbar spine.  Patient agreeable to this.  Will avoid Prednisone at this time due to poor diabetes control.  Scripts for Gabapentin and Flexeril sent, educated him on medications and use.  Discussed that if pain improves he could slowly transition himself off Gabapentin.  Recommend use of Tylenol at home as needed, avoid NSAIDs.  May use Diclofenac or Biofreeze.  Continue to rest and alternate heat/ice.  Work excuse note provided.  Return to office for worsening or ongoing pain, if ongoing will consider PT referral.

## 2019-08-13 NOTE — Assessment & Plan Note (Signed)
CCM referral placed to assist in increasing compliance and possible cost reduction.

## 2019-08-13 NOTE — Assessment & Plan Note (Signed)
Chronic, ongoing with BP at goal today.  Continue current medication with Benazepril for kidney protection, adjust as needed.  Recommend he monitor BP at home at least once a week and document, plus bring to visits.  DASH diet focus recommended.  Obtain labs today to include BMP.  Return in 3 months.

## 2019-08-14 LAB — LIPID PANEL W/O CHOL/HDL RATIO
Cholesterol, Total: 241 mg/dL — ABNORMAL HIGH (ref 100–199)
HDL: 33 mg/dL — ABNORMAL LOW (ref >39)
LDL Chol Calc (NIH): 89 mg/dL (ref 0–99)
Triglycerides: 716 mg/dL (ref 0–149)
VLDL Cholesterol Cal: 119 mg/dL — ABNORMAL HIGH (ref 5–40)

## 2019-08-14 LAB — BASIC METABOLIC PANEL
BUN/Creatinine Ratio: 18 (ref 9–20)
BUN: 14 mg/dL (ref 6–24)
CO2: 21 mmol/L (ref 20–29)
Calcium: 9.5 mg/dL (ref 8.7–10.2)
Chloride: 104 mmol/L (ref 96–106)
Creatinine, Ser: 0.8 mg/dL (ref 0.76–1.27)
GFR calc Af Amer: 115 mL/min/{1.73_m2} (ref >59)
GFR calc non Af Amer: 100 mL/min/{1.73_m2} (ref >59)
Glucose: 200 mg/dL — ABNORMAL HIGH (ref 65–99)
Potassium: 4.7 mmol/L (ref 3.5–5.2)
Sodium: 142 mmol/L (ref 134–144)

## 2019-08-15 NOTE — Progress Notes (Signed)
Contacted via MyChart  Good evening Mr. Pask, your labs have returned.  Did you eat before labs?  If so I want to recheck your cholesterol levels over the next 2 weeks, as triglycerides were quite elevated which raises concern for pancreatitis over time.  Kidney function remains stable.  Any questions?  Please schedule an outpatient lab visit for me in 2 weeks.  Ensure you are taking statin. Keep being awesome!! Kindest regards, Lachae Hohler

## 2019-08-19 ENCOUNTER — Telehealth: Payer: Self-pay | Admitting: General Practice

## 2019-08-19 NOTE — Telephone Encounter (Signed)
  Chronic Care Management   Outreach Note  08/19/2019 Name: Matthew Collier MRN: [id number] DOB: 1965-12-31  Referred by: Venita Lick, NP Reason for referral : Appointment (Urgent Initial outreach attempt for RNCM: Chronic Disease Management and Care Coordination Needs)   An unsuccessful telephone outreach was attempted today. The patient was referred to the case management team for assistance with care management and care coordination.   Follow Up Plan: A HIPPA compliant phone message was left for the patient providing contact information and requesting a return call.   Noreene Larsson RN, MSN, [facility] Mobile: [phone]

## 2019-08-20 NOTE — Chronic Care Management (AMB) (Signed)
  Care Management   Outreach Note  08/20/2019 Name: Matthew Collier MRN: [id number] DOB: 1965-08-01  Matthew Collier is a 54 y.o. year old male who is a primary care patient of Cannady, Barbaraann Faster, NP. I reached out to Matthew Collier by phone today in response to a referral sent by Mr. Matthew Collier's PCP, Venita Lick, NP.     A second unsuccessful telephone outreach was attempted today. The patient was referred to the case management team for assistance with care management and care coordination.   Follow Up Plan: A HIPPA compliant phone message was left for the patient providing contact information and requesting a return call. The care management team will reach out to the patient again over the next 7 days. If patient returns call to provider office, please advise to call [place] at [phone].  [place], [place] [address] Direct Dial: [phone] [email or web address]: [email or web address]

## 2019-08-23 ENCOUNTER — Telehealth: Payer: Self-pay | Admitting: General Practice

## 2019-08-23 NOTE — Telephone Encounter (Signed)
  Chronic Care Management   Outreach Note  08/23/2019 Name: Matthew Collier MRN: [id number] DOB: 10/24/1965  Referred by: Venita Lick, NP Reason for referral : Appointment (RNCM: 2nd attempt for Initial outreach for Chronic Disease Management and Care Coordination Needs)   A second unsuccessful telephone outreach was attempted today. The patient was referred to the case management team for assistance with care management and care coordination. Called and left message on the home phone. Attempted to reach the patients spouse at listed number. Unable to leave a message.   Follow Up Plan: The care management team will reach out to the patient again over the next 7 to 14 days.   Noreene Larsson RN, MSN, [facility] Mobile: [phone]

## 2019-08-24 NOTE — Telephone Encounter (Signed)
Per Pam Noted in referral notes   The patients wife called the RNCM back on 08-23-2019.  She is going to talk to the patient and she or the patient will call the RNCM back on 08-24-2019. Just FYI so no reschedule for RNCM needed at the present time.

## 2019-08-25 NOTE — Chronic Care Management (AMB) (Signed)
   Care Management   Outreach Note  08/25/2019 Name: [name] MRN: [id number] DOB: [birth year]  Matthew Collier is a 54 y.o. year old male who is a primary care patient of Cannady, Barbaraann Faster, NP. I reached out to Matthew Collier by phone today in response to a referral sent by Mr. Jemari Hallum Anastasia's PCP, Venita Lick, NP.      Third unsuccessful telephone outreach was attempted today. The patient was referred to the case management team for assistance with care management and care coordination. The patient's primary care provider has been notified of our unsuccessful attempts to make or maintain contact with the patient. The care management team is pleased to engage with this patient at any time in the future should he/she be interested in assistance from the care management team.   Follow Up Plan: A HIPPA compliant phone message was left for the patient providing contact information and requesting a return call. The care management team is available to follow up with the patient after provider conversation with the patient regarding recommendation for care management engagement and subsequent re-referral to the care management team.   [place], [place], [place] [address] Direct Dial: [email or web address] Website: [email or web address]

## 2019-11-09 ENCOUNTER — Ambulatory Visit: Payer: BC Managed Care – PPO | Admitting: Nurse Practitioner

## 2019-11-09 ENCOUNTER — Other Ambulatory Visit: Payer: Self-pay

## 2019-11-09 ENCOUNTER — Encounter: Payer: Self-pay | Admitting: Family Medicine

## 2019-11-09 ENCOUNTER — Ambulatory Visit: Payer: BC Managed Care – PPO | Admitting: Family Medicine

## 2019-11-09 VITALS — BP 138/87 | HR 97 | Temp 98.1°F | Wt 220.0 lb

## 2019-11-09 DIAGNOSIS — K5792 Diverticulitis of intestine, part unspecified, without perforation or abscess without bleeding: Secondary | ICD-10-CM | POA: Diagnosis not present

## 2019-11-09 DIAGNOSIS — R3915 Urgency of urination: Secondary | ICD-10-CM

## 2019-11-09 LAB — URINALYSIS, ROUTINE W REFLEX MICROSCOPIC
Bilirubin, UA: NEGATIVE
Leukocytes,UA: NEGATIVE
Nitrite, UA: NEGATIVE
Protein,UA: NEGATIVE
RBC, UA: NEGATIVE
Specific Gravity, UA: 1.01 (ref 1.005–1.030)
Urobilinogen, Ur: 0.2 mg/dL (ref 0.2–1.0)
pH, UA: 6 (ref 5.0–7.5)

## 2019-11-09 MED ORDER — METRONIDAZOLE 500 MG PO TABS
500.0000 mg | ORAL_TABLET | Freq: Three times a day (TID) | ORAL | 0 refills | Status: AC
Start: 2019-11-09 — End: 2019-11-13

## 2019-11-09 MED ORDER — CIPROFLOXACIN HCL 500 MG PO TABS
500.0000 mg | ORAL_TABLET | Freq: Two times a day (BID) | ORAL | 0 refills | Status: AC
Start: 2019-11-09 — End: 2019-11-13

## 2019-11-09 NOTE — Progress Notes (Signed)
   SUBJECTIVE:   CHIEF COMPLAINT / HPI:   ABDOMINAL PAIN  L sided lower abdominal pain. Pain began 2 days ago Medications tried: metamucil helps. clearlax helps. Pain improved.  Similar pain before: yes, when had prostatitis years ago. States metamucil helped prostatitis. Prior abdominal surgeries: no Feels like pressure, like has to have BM. Denies pain while sitting. Has pain with standing or changing positions. H/o sessile and tubular adenomatous polyps, diverticula seen in 2019, due for repeat colonoscopy.  Symptoms Nausea/vomiting: no Diarrhea: no  Constipation: no, slight straining sometimes. Takes metamucil Blood in stool: no Blood in vomit: n/a Fever: no Dysuria: no Loss of appetite: no Weight loss: no  Vaginal Bleeding: n/a Missed menstrual period: n/a   NASAL CONGESTION  Reports few month h/o L sided nasal congestion, occasional rhinorrhea. Feels like it has moved to his chest. Has tried OTC sinus and allergy meds but not daily. Denies discharge from nose. Eyes occasionally water.  Coughing phelgm, no blood. Smokes cigars occasionally but otherwise does not smoke. No SOB. Tried nasal steroid spray a few times.   Health Maintenance - COVID vaccine updated in chart.  - Declines flu and PNA vaccine today. - Declines hep C screening.  PERTINENT  PMH / PSH: Barrett's esophagus w/o dysplasia, HTN, T2DM, HLD, h/o colonic polyps, diverticulosis, obesity  OBJECTIVE:   BP 138/87   Pulse 97   Temp 98.1 F (36.7 C) (Oral)   Wt 220 lb (99.8 kg)   SpO2 97%   BMI 33.15 kg/m   Gen: obese, in NAD HEENT: no tenderness to palpation over maxillary sinuses Cardiac: RRR, no murmur Lungs: CTAB, no wheeze or rales Abd: soft, +BS. Obese abdomen. TTP in LLQ. No rebound tenderness. Slight ventral bulge with changing positions. No inguinal hernia appreciated.   ASSESSMENT/PLAN:   Diverticulitis Symptoms and exam most consistent with diverticulitis given location of pain and  h/o diverticulosis. No findings to concern for acute surgical abdomen or hernia. UA negative. Cipro and flagyl sent to pharmacy. Continue bowel regimen with fiber to maintain regular soft stools. RTC if no better.    Myles Gip, DO [place]

## 2019-11-09 NOTE — Patient Instructions (Addendum)
It was great to see you!  Our plans for today:  - Call GI for your colonoscopy. [facility]. - Take the antibiotics. Come back if you are still having pain. - Continue metamucil or clearlax. - If you develop intense pain, fever, vomiting, or inability to eat, come back or go to the Emergency Department.  Take care and seek immediate care sooner if you develop any concerns.   Dr. Johnsie [facility]

## 2019-11-09 NOTE — Assessment & Plan Note (Addendum)
Symptoms and exam most consistent with diverticulitis given location of pain and h/o diverticulosis. No findings to concern for acute surgical abdomen or hernia. UA negative. Cipro and flagyl sent to pharmacy. Continue bowel regimen with fiber to maintain regular soft stools. RTC if no better.

## 2019-11-16 ENCOUNTER — Ambulatory Visit: Payer: BC Managed Care – PPO | Admitting: Nurse Practitioner

## 2019-12-21 ENCOUNTER — Ambulatory Visit: Payer: BC Managed Care – PPO | Admitting: Family Medicine

## 2019-12-21 ENCOUNTER — Other Ambulatory Visit: Payer: Self-pay

## 2020-01-04 ENCOUNTER — Ambulatory Visit: Payer: BC Managed Care – PPO | Admitting: Family Medicine

## 2020-01-04 ENCOUNTER — Encounter: Payer: Self-pay | Admitting: Family Medicine

## 2020-01-04 ENCOUNTER — Other Ambulatory Visit: Payer: Self-pay

## 2020-01-04 VITALS — BP 140/90 | HR 77 | Temp 98.2°F | Ht 68.66 in | Wt 219.1 lb

## 2020-01-04 DIAGNOSIS — K5792 Diverticulitis of intestine, part unspecified, without perforation or abscess without bleeding: Secondary | ICD-10-CM

## 2020-01-04 DIAGNOSIS — E669 Obesity, unspecified: Secondary | ICD-10-CM

## 2020-01-04 DIAGNOSIS — E1159 Type 2 diabetes mellitus with other circulatory complications: Secondary | ICD-10-CM | POA: Diagnosis not present

## 2020-01-04 DIAGNOSIS — E1169 Type 2 diabetes mellitus with other specified complication: Secondary | ICD-10-CM

## 2020-01-04 DIAGNOSIS — E785 Hyperlipidemia, unspecified: Secondary | ICD-10-CM

## 2020-01-04 DIAGNOSIS — I152 Hypertension secondary to endocrine disorders: Secondary | ICD-10-CM

## 2020-01-04 LAB — BAYER DCA HB A1C WAIVED: HB A1C (BAYER DCA - WAIVED): 12.5 % — ABNORMAL HIGH (ref <7.0)

## 2020-01-04 MED ORDER — TRULICITY 0.75 MG/0.5ML ~~LOC~~ SOAJ
0.7500 mg | SUBCUTANEOUS | 0 refills | Status: DC
Start: 2020-01-04 — End: 2020-02-02

## 2020-01-04 NOTE — Assessment & Plan Note (Signed)
Above goal today. Previously with excellent control on same regimen. Has had some issues with compliance with medication, counseling provided. Will focus on diet and exercise and return for recheck in 4 weeks. Obtaining BMP today.

## 2020-01-04 NOTE — Assessment & Plan Note (Signed)
Uncontrolled and exacerbated by medication compliance issues, A1c 12.5 today. On max dose of bydureon, xigduo. Will switch bydureon to trulicity to be able to titrate as needed for better control. Counseling provided regarding med compliance, weight loss through diet and exercise. F/u in 4 weeks. 

## 2020-01-04 NOTE — Patient Instructions (Addendum)
It was great to see you!  Our plans for today:  - Aim for at least one vegetable with each meal. See below for tips. - We are switching bydureon to trulicity. No other medication changes.  - Work on increasing your exercise, take the stairs when you can, park further from the store.   We are checking some labs today, we will release these results to your MyChart.  Take care and seek immediate care sooner if you develop any concerns.   Dr. Linwood Dibbles  Here is an example of what a healthy plate looks like:    ? Make half your plate fruits and vegetables.     ? Focus on whole fruits.     ? Vary your veggies.  ? Make half your grains whole grains. -     ? Look for the word "whole" at the beginning of the ingredients list    ? Some whole-grain ingredients include whole oats, whole-wheat flour,        whole-grain corn, whole-grain brown rice, and whole rye.  ? Move to low-fat and fat-free milk or yogurt.  ? Vary your protein routine. - Meat, fish, poultry (chicken, [place]), eggs, beans (kidney, pinto), dairy.  ? Drink and eat less sodium, saturated fat, and added sugars.   Diet Recommendations for Diabetes   1. Eat at least 3 meals and 1-2 snacks per day. Never go more than 4-5 hours while awake without eating. Eat breakfast within the first hour of getting up.   2. Limit starchy foods to TWO per meal and ONE per snack. ONE portion of a starchy  food is equal to the following:   - ONE slice of bread (or its equivalent, such as half of a hamburger bun).   - 1/2 cup of a "scoopable" starchy food such as potatoes or rice.   - 15 grams of Total Carbohydrate as shown on food label.  3. Include at every meal: a protein food, a carb food, and vegetables and/or fruit.   - Obtain twice the volume of vegetables as protein or carbohydrate foods for both lunch and dinner.   - Fresh or frozen vegetables are best.   - Keep frozen vegetables on hand for a quick vegetable serving.       Starchy  (carb) foods: Bread, rice, pasta, potatoes, corn, cereal, grits, crackers, bagels, muffins, all baked goods.  (Fruits, milk, and yogurt also have carbohydrate, but most of these foods will not spike your blood sugar as most starchy foods will.)  A few fruits do cause high blood sugars; use small portions of bananas (limit to 1/2 at a time), grapes, watermelon, oranges, and most tropical fruits.    Protein foods: Meat, fish, poultry, eggs, dairy foods, and beans such as pinto and kidney beans (beans also provide carbohydrate).

## 2020-01-04 NOTE — Progress Notes (Signed)
   SUBJECTIVE:   CHIEF COMPLAINT / HPI:   Past Medical History:  Diagnosis Date  . Diabetes mellitus without complication (HCC)   . Hyperlipidemia   . Hypertension    Hypertension: - Medications: benazepril 40mg  daily - Compliance: good - Checking BP at home: no - Denies any SOB, CP, vision changes, LE edema, medication SEs, or symptoms of hypotension - Diet: see below - Exercise: see below  Diabetes, Type 2 - Last A1c 9.7 08/2019 - Medications: dapagliflozin-metformin 5-1000mg  BID, bydureon 2mg  weekly, glipizide 5mg  daily prn - Compliance: sometimes forgets to take xigduo. Taking glipizide more now due to higher CBGs. - Checking BG at home: yes, CBGs 120-260. - Diet: eats 3 meals per day with occasional snacks.  - Exercise: walking at work - Eye exam: due - Foot exam: UTD - Statin: yes - Denies symptoms of hypoglycemia, numbness extremities, foot ulcers/trauma - Endorses some polyuria, polydipsia.  Diverticulitis - Seen previously 11/2 for LLQ abd pain. Gave cipro/flagyl. - pain resolved  OBJECTIVE:   BP 140/90   Pulse 77   Temp 98.2 F (36.8 C)   Ht 5' 8.66" (1.744 m)   Wt 219 lb 2 oz (99.4 kg)   SpO2 97%   BMI 32.68 kg/m   Gen: overweight, in NAD Cardiac: RRR, no murmur Lungs: CTAB Ext: WWP, no edema  ASSESSMENT/PLAN:   Hypertension associated with diabetes (HCC) Above goal today. Previously with excellent control on same regimen. Has had some issues with compliance with medication, counseling provided. Will focus on diet and exercise and return for recheck in 4 weeks. Obtaining BMP today.  Type 2 diabetes mellitus with obesity (HCC) Uncontrolled and exacerbated by medication compliance issues, A1c 12.5 today. On max dose of bydureon, xigduo. Will switch bydureon to trulicity to be able to titrate as needed for better control. Counseling provided regarding med compliance, weight loss through diet and exercise. F/u in 4 weeks.  Diverticulitis Resolved.     , DO

## 2020-01-04 NOTE — Assessment & Plan Note (Signed)
Resolved

## 2020-01-04 NOTE — Assessment & Plan Note (Deleted)
Uncontrolled and exacerbated by medication compliance issues, A1c 12.5 today. On max dose of bydureon, xigduo. Will switch bydureon to trulicity to be able to titrate as needed for better control. Counseling provided regarding med compliance, weight loss through diet and exercise. F/u in 4 weeks.

## 2020-01-05 LAB — BASIC METABOLIC PANEL
BUN/Creatinine Ratio: 16 (ref 9–20)
BUN: 12 mg/dL (ref 6–24)
CO2: 25 mmol/L (ref 20–29)
Calcium: 10 mg/dL (ref 8.7–10.2)
Chloride: 103 mmol/L (ref 96–106)
Creatinine, Ser: 0.77 mg/dL (ref 0.76–1.27)
GFR calc Af Amer: 117 mL/min/{1.73_m2} (ref >59)
GFR calc non Af Amer: 101 mL/min/{1.73_m2} (ref >59)
Glucose: 213 mg/dL — ABNORMAL HIGH (ref 65–99)
Potassium: 4.3 mmol/L (ref 3.5–5.2)
Sodium: 145 mmol/L — ABNORMAL HIGH (ref 134–144)

## 2020-02-02 ENCOUNTER — Other Ambulatory Visit: Payer: Self-pay

## 2020-02-02 ENCOUNTER — Encounter: Payer: Self-pay | Admitting: Family Medicine

## 2020-02-02 ENCOUNTER — Ambulatory Visit: Payer: BC Managed Care – PPO | Admitting: Family Medicine

## 2020-02-02 VITALS — BP 136/90 | HR 86 | Temp 98.9°F | Wt 221.1 lb

## 2020-02-02 DIAGNOSIS — E785 Hyperlipidemia, unspecified: Secondary | ICD-10-CM | POA: Diagnosis not present

## 2020-02-02 DIAGNOSIS — I152 Hypertension secondary to endocrine disorders: Secondary | ICD-10-CM

## 2020-02-02 DIAGNOSIS — E1159 Type 2 diabetes mellitus with other circulatory complications: Secondary | ICD-10-CM | POA: Diagnosis not present

## 2020-02-02 DIAGNOSIS — E1169 Type 2 diabetes mellitus with other specified complication: Secondary | ICD-10-CM | POA: Diagnosis not present

## 2020-02-02 DIAGNOSIS — E669 Obesity, unspecified: Secondary | ICD-10-CM

## 2020-02-02 DIAGNOSIS — Z6833 Body mass index (BMI) 33.0-33.9, adult: Secondary | ICD-10-CM

## 2020-02-02 DIAGNOSIS — E6609 Other obesity due to excess calories: Secondary | ICD-10-CM

## 2020-02-02 MED ORDER — EXENATIDE ER 2 MG ~~LOC~~ PEN: 2.0000 mg | PEN_INJECTOR | SUBCUTANEOUS | 2 refills | Status: DC

## 2020-02-02 NOTE — Progress Notes (Signed)
    SUBJECTIVE:   CHIEF COMPLAINT / HPI:   Patient Active Problem List   Diagnosis Date Noted  . Poor compliance with medication 08/13/2019  . Acute hip pain, left 08/13/2019  . Obese 03/29/2019  . Iron deficiency 03/29/2019  . Hyperlipidemia associated with type 2 diabetes mellitus ([place]) 03/26/2019  . Barrett's esophagus without dysplasia   . Ectopic gastric tissue   . Special screening for malignant neoplasms, colon   . Benign neoplasm of cecum   . Benign neoplasm of descending colon   . Benign neoplasm of transverse colon   . Polyp of sigmoid colon   . Benign neoplasm of rectosigmoid junction   . Diverticulitis   . Lipoma of intra-abdominal organs   . Type 2 diabetes mellitus with obesity ([place])   . Hypertension associated with diabetes ([place])    Hypertension: - Medications: benazepril 40mg  - Compliance: some missed doses over the weekends. - Denies any SOB, CP, vision changes, LE edema, medication SEs, or symptoms of hypotension - Diet: see below - Exercise: see below   HLD - medications: simvastatin 40mg  - compliance: some missed doses - medication SEs: none  Diabetes, Type 2 - Last A1c 12.5 01/04/20 - Medications: dapagliflozin-metformin 5-1000mg  BID, trulicity 0.75mg  weekly (switched from bydureon at last visit), glipizide 5mg  daily prn - Compliance: stayed with the bydureon, has been better about taking meds. Taking glipizide once daily, not every day.  - Checking BG at home: yes, 107-150s - Diet: cut back on snacks and bread, trying to eat more vegetables. - Exercise: walking at work  - Eye exam: due - Foot exam: UTD - Statin: yes - Denies symptoms of hypoglycemia, polyuria, polydipsia, numbness extremities, foot ulcers/trauma   OBJECTIVE:   BP 136/90   Pulse 86   Temp 98.9 F (37.2 C)   Wt 221 lb 2 oz (100.3 kg)   SpO2 98%   BMI 32.98 kg/m   Gen: well appearing, in NAD Card: reg rate Lungs: comfortable WOB on RA Ext: WWP  ASSESSMENT/PLAN:    Hypertension associated with diabetes ([place]) Only slightly above goal today. Still with difficulties remembering to take meds, much counseling provided regarding this. Will have patient focus on compliance with obtaining and placement of pill box where he can remember to take meds and revisit in 2 months.  Type 2 diabetes mellitus with obesity ([place]) Reports CBGs within goal range since remembering to take meds and cutting back on sweets. Was unable to afford trulicity, will maintain with the bydureon and recheck a1c in 2 months. If compliance improves and a1c still elevated, consider switching bydureon to ozempic to aid in weight loss. Diet and exercise counseling provided today.  Hyperlipidemia associated with type 2 diabetes mellitus ([place]) Recheck lipids today.  Obese Contributing to DM, HTN. Counseling provided regarding diet and exercise today.    Myles Gip, DO

## 2020-02-02 NOTE — Patient Instructions (Signed)
It was great to see you!  Our plans for today:  - Get a am/pm pill box and place it by something you use every day, like your toothbrush. It's important to take your meds every day even if you are a few hours late in taking a dose.  - Come back in 2 months for followup.   We are checking some labs today, we will release these results to your MyChart.  Take care and seek immediate care sooner if you develop any concerns.   Dr. Ky Barban

## 2020-02-02 NOTE — Assessment & Plan Note (Signed)
Contributing to DM, HTN. Counseling provided regarding diet and exercise today.

## 2020-02-02 NOTE — Assessment & Plan Note (Signed)
Only slightly above goal today. Still with difficulties remembering to take meds, much counseling provided regarding this. Will have patient focus on compliance with obtaining and placement of pill box where he can remember to take meds and revisit in 2 months.

## 2020-02-02 NOTE — Assessment & Plan Note (Addendum)
Reports CBGs within goal range since remembering to take meds and cutting back on sweets. Was unable to afford trulicity, will maintain with the bydureon and recheck a1c in 2 months. If compliance improves and a1c still elevated, consider switching bydureon to ozempic to aid in weight loss. Diet and exercise counseling provided today.

## 2020-02-02 NOTE — Assessment & Plan Note (Signed)
-   Recheck lipids today

## 2020-02-03 LAB — LIPID PANEL
Chol/HDL Ratio: 4.4 ratio (ref 0.0–5.0)
Cholesterol, Total: 144 mg/dL (ref 100–199)
HDL: 33 mg/dL — ABNORMAL LOW (ref >39)
LDL Chol Calc (NIH): 71 mg/dL (ref 0–99)
Triglycerides: 245 mg/dL — ABNORMAL HIGH (ref 0–149)
VLDL Cholesterol Cal: 40 mg/dL (ref 5–40)

## 2020-02-16 DIAGNOSIS — U071 COVID-19: Secondary | ICD-10-CM

## 2020-02-16 HISTORY — DX: COVID-19: U07.1

## 2020-02-21 ENCOUNTER — Encounter: Payer: Self-pay | Admitting: Nurse Practitioner

## 2020-02-21 ENCOUNTER — Ambulatory Visit: Payer: BC Managed Care – PPO | Admitting: Nurse Practitioner

## 2020-02-21 ENCOUNTER — Other Ambulatory Visit: Payer: Self-pay

## 2020-02-21 DIAGNOSIS — Z8616 Personal history of COVID-19: Secondary | ICD-10-CM | POA: Insufficient documentation

## 2020-02-21 NOTE — Assessment & Plan Note (Signed)
Acute and improved at this time.  Diagnosed via home testing on 02/16/20.  Vaccinated x 2, recommend he obtain booster.  At this time feels 100% better.  Overall exam normal.  Will provide return to work letter and aware to wear mask consistently.  Return for any return of symptoms.

## 2020-02-21 NOTE — Progress Notes (Signed)
BP 126/84 Comment: manual  Pulse 98   Ht 5' 8.47" (1.739 m)   Wt 219 lb (99.3 kg)   SpO2 97%   BMI 32.85 kg/m    Subjective:    Patient ID: Matthew Collier, male    DOB: 03/03/1965, 55 y.o.   MRN: [id number]  HPI: JORRELL KUSTER is a 55 y.o. male  Chief Complaint  Patient presents with  . Return to work note    Took home test last Wednesday 02/16/20, tested positive for covid, patient states that he is symptom clear.   COVID POSITIVE Tested 02/16/2020 which was positive.  Was not feeling well, felt like a cold and sinus issues -- last 2-3 days and then Saturday started feeling better.  Overall feeling 100% better at this time.  No further medications taken.  Denies loss of taste or smell.    Fever: no Cough: no Shortness of breath: no Wheezing: no Chest pain: no Chest tightness: no Chest congestion: no Nasal congestion: no Runny nose: no Post nasal drip: no Sneezing: no Sore throat: no Swollen glands: no Sinus pressure: no Headache: no Face pain: no Toothache: no Ear pain: none Ear pressure: none Eyes red/itching:no Eye drainage/crusting: no  Vomiting: no Rash: no Fatigue: no Sick contacts: yes Context: better Relief with OTC cold/cough medications: yes  Treatments attempted: none, cold/sinus and anti-histamine   Relevant past medical, surgical, family and social history reviewed and updated as indicated. Interim medical history since our last visit reviewed. Allergies and medications reviewed and updated.  Review of Systems  Constitutional: Negative for activity change, diaphoresis, fatigue and fever.  HENT: Negative.   Respiratory: Negative for cough, chest tightness, shortness of breath and wheezing.   Cardiovascular: Negative for chest pain, palpitations and leg swelling.  Gastrointestinal: Negative.   Neurological: Negative.   Psychiatric/Behavioral: Negative.     Per HPI unless specifically indicated above     Objective:    BP 126/84 Comment: manual   Pulse 98   Ht 5' 8.47" (1.739 m)   Wt 219 lb (99.3 kg)   SpO2 97%   BMI 32.85 kg/m   Wt Readings from Last 3 Encounters:  02/21/20 219 lb (99.3 kg)  02/02/20 221 lb 2 oz (100.3 kg)  01/04/20 219 lb 2 oz (99.4 kg)    Physical Exam Vitals and nursing note reviewed.  Constitutional:      General: He is awake.     Appearance: He is well-developed and well-groomed. He is obese.  HENT:     Head: Normocephalic and atraumatic.     Right Ear: Hearing, tympanic membrane, ear canal and external ear normal. No drainage.     Left Ear: Hearing, tympanic membrane, ear canal and external ear normal. No drainage.     Nose: Nose normal.     Mouth/Throat:     Mouth: Mucous membranes are moist.     Pharynx: Oropharynx is clear.  Eyes:     General: Lids are normal.        Right eye: No discharge.        Left eye: No discharge.     Conjunctiva/sclera: Conjunctivae normal.     Pupils: Pupils are equal, round, and reactive to light.  Neck:     Thyroid: No thyromegaly.     Vascular: No carotid bruit.     Trachea: Trachea normal.  Cardiovascular:     Rate and Rhythm: Normal rate and regular rhythm.     Heart sounds: Normal heart  sounds, S1 normal and S2 normal. No murmur heard. No gallop.   Pulmonary:     Effort: Pulmonary effort is normal. No accessory muscle usage or respiratory distress.     Breath sounds: Normal breath sounds.     Comments: Clear throughout. Abdominal:     General: Bowel sounds are normal.     Palpations: Abdomen is soft.     Tenderness: There is no abdominal tenderness.  Musculoskeletal:        General: Normal range of motion.     Cervical back: Normal range of motion and neck supple.     Right lower leg: No edema.     Left lower leg: No edema.  Skin:    General: Skin is warm and dry.     Capillary Refill: Capillary refill takes less than 2 seconds.     Findings: No rash.  Neurological:     Mental Status: He is alert and oriented to person, place, and time.      Deep Tendon Reflexes: Reflexes are normal and symmetric.     Reflex Scores:      Brachioradialis reflexes are 2+ on the right side and 2+ on the left side.      Patellar reflexes are 2+ on the right side and 2+ on the left side. Psychiatric:        Attention and Perception: Attention normal.        Mood and Affect: Mood normal.        Speech: Speech normal.        Behavior: Behavior normal. Behavior is cooperative.        Thought Content: Thought content normal.     Results for orders placed or performed in visit on 02/02/20  Lipid panel  Result Value Ref Range   Cholesterol, Total 144 100 - 199 mg/dL   Triglycerides 245 (H) 0 - 149 mg/dL   HDL 33 (L) >39 mg/dL   VLDL Cholesterol Cal 40 5 - 40 mg/dL   LDL Chol Calc (NIH) 71 0 - 99 mg/dL   Chol/HDL Ratio 4.4 0.0 - 5.0 ratio      Assessment & Plan:   Problem List Items Addressed This Visit      Other   History of 2019 novel coronavirus disease (COVID-19)    Acute and improved at this time.  Diagnosed via home testing on 02/16/20.  Vaccinated x 2, recommend he obtain booster.  At this time feels 100% better.  Overall exam normal.  Will provide return to work letter and aware to wear mask consistently.  Return for any return of symptoms.          Follow up plan: Return for at end of March as scheduled for A1c.

## 2020-02-21 NOTE — Patient Instructions (Signed)
Person Under Monitoring Name: Matthew Collier  Location: [place] [address]   Infection Prevention Recommendations for Individuals Confirmed to have, or Being Evaluated for, 2019 Novel Coronavirus (COVID-19) Infection Who Receive Care at Home  Individuals who are confirmed to have, or are being evaluated for, COVID-19 should follow the prevention steps below until a healthcare provider or local or state health department says they can return to normal activities.  Stay home except to get medical care You should restrict activities outside your home, except for getting medical care. Do not go to work, school, or public areas, and do not use public transportation or taxis.  Call ahead before visiting your doctor Before your medical appointment, call the healthcare provider and tell them that you have, or are being evaluated for, COVID-19 infection. This will help the healthcare provider's office take steps to keep other people from getting infected. Ask your healthcare provider to call the local or state health department.  Monitor your symptoms Seek prompt medical attention if your illness is worsening (e.g., difficulty breathing). Before going to your medical appointment, call the healthcare provider and tell them that you have, or are being evaluated for, COVID-19 infection. Ask your healthcare provider to call the local or state health department.  Wear a facemask You should wear a facemask that covers your nose and mouth when you are in the same room with other people and when you visit a healthcare provider. People who live with or visit you should also wear a facemask while they are in the same room with you.  Separate yourself from other people in your home As much as possible, you should stay in a different room from other people in your home. Also, you should use a separate bathroom, if available.  Avoid sharing household items You should not share  dishes, drinking glasses, cups, eating utensils, towels, bedding, or other items with other people in your home. After using these items, you should wash them thoroughly with soap and water.  Cover your coughs and sneezes Cover your mouth and nose with a tissue when you cough or sneeze, or you can cough or sneeze into your sleeve. Throw used tissues in a lined trash can, and immediately wash your hands with soap and water for at least 20 seconds or use an alcohol-based hand rub.  Wash your [facility] your hands often and thoroughly with soap and water for at least 20 seconds. You can use an alcohol-based hand sanitizer if soap and water are not available and if your hands are not visibly dirty. Avoid touching your eyes, nose, and mouth with unwashed hands.   Prevention Steps for Caregivers and Household Members of Individuals Confirmed to have, or Being Evaluated for, COVID-19 Infection Being Cared for in the Home  If you live with, or provide care at home for, a person confirmed to have, or being evaluated for, COVID-19 infection please follow these guidelines to prevent infection:  Follow healthcare provider's instructions Make sure that you understand and can help the patient follow any healthcare provider instructions for all care.  Provide for the patient's basic needs You should help the patient with basic needs in the home and provide support for getting groceries, prescriptions, and other personal needs.  Monitor the patient's symptoms If they are getting sicker, call his or her medical provider and tell them that the patient has, or is being evaluated for, COVID-19 infection. This will help the healthcare  provider's office take steps to keep other people from getting infected. Ask the healthcare provider to call the local or state health department.  Limit the number of people who have contact with the patient  If possible, have only one caregiver for the patient.  Other  household members should stay in another home or place of residence. If this is not possible, they should stay  in another room, or be separated from the patient as much as possible. Use a separate bathroom, if available.  Restrict visitors who do not have an essential need to be in the home.  Keep older adults, very young children, and other sick people away from the patient Keep older adults, very young children, and those who have compromised immune systems or chronic health conditions away from the patient. This includes people with chronic heart, lung, or kidney conditions, diabetes, and cancer.  Ensure good ventilation Make sure that shared spaces in the home have good air flow, such as from an air conditioner or an opened window, weather permitting.  Wash your hands often  Wash your hands often and thoroughly with soap and water for at least 20 seconds. You can use an alcohol based hand sanitizer if soap and water are not available and if your hands are not visibly dirty.  Avoid touching your eyes, nose, and mouth with unwashed hands.  Use disposable paper towels to dry your hands. If not available, use dedicated cloth towels and replace them when they become wet.  Wear a facemask and gloves  Wear a disposable facemask at all times in the room and gloves when you touch or have contact with the patient's blood, body fluids, and/or secretions or excretions, such as sweat, saliva, sputum, nasal mucus, vomit, urine, or feces.  Ensure the mask fits over your nose and mouth tightly, and do not touch it during use.  Throw out disposable facemasks and gloves after using them. Do not reuse.  Wash your hands immediately after removing your facemask and gloves.  If your personal clothing becomes contaminated, carefully remove clothing and launder. Wash your hands after handling contaminated clothing.  Place all used disposable facemasks, gloves, and other waste in a lined container before  disposing them with other household waste.  Remove gloves and wash your hands immediately after handling these items.  Do not share dishes, glasses, or other household items with the patient  Avoid sharing household items. You should not share dishes, drinking glasses, cups, eating utensils, towels, bedding, or other items with a patient who is confirmed to have, or being evaluated for, COVID-19 infection.  After the person uses these items, you should wash them thoroughly with soap and water.  Wash laundry thoroughly  Immediately remove and wash clothes or bedding that have blood, body fluids, and/or secretions or excretions, such as sweat, saliva, sputum, nasal mucus, vomit, urine, or feces, on them.  Wear gloves when handling laundry from the patient.  Read and follow directions on labels of laundry or clothing items and detergent. In general, wash and dry with the warmest temperatures recommended on the label.  Clean all areas the individual has used often  Clean all touchable surfaces, such as counters, tabletops, doorknobs, bathroom fixtures, toilets, phones, keyboards, tablets, and bedside tables, every day. Also, clean any surfaces that may have blood, body fluids, and/or secretions or excretions on them.  Wear gloves when cleaning surfaces the patient has come in contact with.  Use a diluted bleach solution (e.g., dilute bleach with  1 part bleach and 10 parts water) or a household disinfectant with a label that says EPA-registered for coronaviruses. To make a bleach solution at home, add 1 tablespoon of bleach to 1 quart (4 cups) of water. For a larger supply, add  cup of bleach to 1 gallon (16 cups) of water.  Read labels of cleaning products and follow recommendations provided on product labels. Labels contain instructions for safe and effective use of the cleaning product including precautions you should take when applying the product, such as wearing gloves or eye protection  and making sure you have good ventilation during use of the product.  Remove gloves and wash hands immediately after cleaning.  Monitor yourself for signs and symptoms of illness Caregivers and household members are considered close contacts, should monitor their health, and will be asked to limit movement outside of the home to the extent possible. Follow the monitoring steps for close contacts listed on the symptom monitoring form.   ? If you have additional questions, contact your local health department or call the epidemiologist on call at [phone] (available 24/7). ? This guidance is subject to change. For the most up-to-date guidance from [facility], please refer to their website: YouBlogs.pl

## 2020-03-14 ENCOUNTER — Other Ambulatory Visit: Payer: Self-pay | Admitting: Nurse Practitioner

## 2020-03-14 DIAGNOSIS — E1169 Type 2 diabetes mellitus with other specified complication: Secondary | ICD-10-CM

## 2020-03-14 MED ORDER — XIGDUO XR 5-1000 MG PO TB24: 1.0000 | ORAL_TABLET | Freq: Two times a day (BID) | ORAL | 0 refills | Status: DC

## 2020-03-14 NOTE — Telephone Encounter (Signed)
Medication: Dapagliflozin-metFORMIN HCl ER (XIGDUO XR) 05-998 MG TB24 Has the pt contacted their pharmacy? Yes pharmacy called to request  Preferred pharmacy: [facility], [place]. Please be advised refills may take up to 3 business days.  We ask that you follow up with your pharmacy.          Requires a prior auth. Pt is out of medication.

## 2020-03-17 ENCOUNTER — Telehealth: Payer: Self-pay | Admitting: Family Medicine

## 2020-03-17 NOTE — Telephone Encounter (Signed)
Attempted to start PA on Cover My Meds after getting insurance information from the pharmacy. Cover My Meds does not do the  PA for this and directed me to use [email or web address] or call [phone]. Called the number provided. They do not do PA's over the phone and instructed me to use the website.   Entered information given by the pharmacy and patient could not be found. Patient had a different ID in the chart so I tried that as well and patient was still not found.  Called and spoke with the patient's wife. Explained the situation to her. She states she was going to call the patient and have him send a mychart message with the numbers on his prescription card.

## 2020-03-17 NOTE — Telephone Encounter (Signed)
Pharmacy advised pt that he needs PA for   Dapagliflozin-metFORMIN HCl ER (XIGDUO XR) 05-998 MG TB24  And   BYDUREON 2 MG PEN   Pt is out of them and would like this done asap / wife is worried about him not taking this ovr the weekend / please advise asap

## 2020-03-21 ENCOUNTER — Telehealth: Payer: Self-pay

## 2020-03-21 NOTE — Telephone Encounter (Signed)
PA for Xigduo initiated and submitted on [email or web address]. ID: [id number]  PA for Bydureon initiated and submitted on [email or web address]. ID: [id number]

## 2020-03-21 NOTE — Telephone Encounter (Signed)
See other phone encounter.  

## 2020-03-22 NOTE — Telephone Encounter (Signed)
Both PA's approved. Called and notified patient of approvals.

## 2020-04-03 ENCOUNTER — Other Ambulatory Visit: Payer: Self-pay

## 2020-04-03 ENCOUNTER — Encounter: Payer: Self-pay | Admitting: Nurse Practitioner

## 2020-04-03 ENCOUNTER — Ambulatory Visit: Payer: BC Managed Care – PPO | Admitting: Nurse Practitioner

## 2020-04-03 VITALS — BP 132/78 | HR 91 | Temp 98.6°F | Wt 220.4 lb

## 2020-04-03 DIAGNOSIS — E538 Deficiency of other specified B group vitamins: Secondary | ICD-10-CM | POA: Diagnosis not present

## 2020-04-03 DIAGNOSIS — E669 Obesity, unspecified: Secondary | ICD-10-CM

## 2020-04-03 DIAGNOSIS — E785 Hyperlipidemia, unspecified: Secondary | ICD-10-CM

## 2020-04-03 DIAGNOSIS — E6609 Other obesity due to excess calories: Secondary | ICD-10-CM

## 2020-04-03 DIAGNOSIS — Z125 Encounter for screening for malignant neoplasm of prostate: Secondary | ICD-10-CM

## 2020-04-03 DIAGNOSIS — I152 Hypertension secondary to endocrine disorders: Secondary | ICD-10-CM

## 2020-04-03 DIAGNOSIS — E1159 Type 2 diabetes mellitus with other circulatory complications: Secondary | ICD-10-CM

## 2020-04-03 DIAGNOSIS — Z9114 Patient's other noncompliance with medication regimen: Secondary | ICD-10-CM

## 2020-04-03 DIAGNOSIS — E1169 Type 2 diabetes mellitus with other specified complication: Secondary | ICD-10-CM

## 2020-04-03 DIAGNOSIS — Z6833 Body mass index (BMI) 33.0-33.9, adult: Secondary | ICD-10-CM

## 2020-04-03 MED ORDER — PANTOPRAZOLE SODIUM 20 MG PO TBEC: 20.0000 mg | DELAYED_RELEASE_TABLET | Freq: Every day | ORAL | 4 refills | Status: DC

## 2020-04-03 MED ORDER — BYDUREON 2 MG ~~LOC~~ PEN: PEN_INJECTOR | SUBCUTANEOUS | 3 refills | Status: DC

## 2020-04-03 MED ORDER — BENAZEPRIL HCL 40 MG PO TABS: 40.0000 mg | ORAL_TABLET | Freq: Every day | ORAL | 4 refills | Status: DC

## 2020-04-03 MED ORDER — XIGDUO XR 5-1000 MG PO TB24: 1.0000 | ORAL_TABLET | Freq: Two times a day (BID) | ORAL | 4 refills | Status: DC

## 2020-04-03 MED ORDER — SIMVASTATIN 40 MG PO TABS: 40.0000 mg | ORAL_TABLET | Freq: Every day | ORAL | 4 refills | Status: DC

## 2020-04-03 NOTE — Progress Notes (Signed)
BP 132/78 (BP Location: Left Arm)   Pulse 91   Temp 98.6 F (37 C) (Oral)   Wt 220 lb 6.4 oz (100 kg)   SpO2 97%   BMI 33.06 kg/m    Subjective:    Patient ID: Matthew Collier, male    DOB: 1965/08/07, 55 y.o.   MRN: [id number]  HPI: Matthew Collier is a 55 y.o. male  Chief Complaint  Patient presents with  . Diabetes    Patient states he had ran out of medication for about 3 weeks due to insurance related issues. Patient would like to discuss restarting his injection every once a week. Patient would like to get back on track with his medications.   . Hypertension   DIABETES Last A1C in December 12.5%, which was upward trend from previous of 9.5% -- the provider attempted to switch him from Bydureon to Trulicity to allow for max titration, but insurance would not cover. Continues on Glipizide 5 MG as needed, and Xigduo 05-998 MG BID.  Endorsed missing medication past 3 weeks due to running out of it with insurance changes -- Xigduo and [employer].  He reports in past when he consistently took Glipizide regularly it caused sugars in 60's.  Matthew Collier was $500 a month, but currently using discount card. Hypoglycemic episodes:no Polydipsia/polyuria: no Visual disturbance: no Chest pain: no Paresthesias: no Glucose Monitoring: yes             Accucheck frequency: occasional             Fasting glucose: some 130 range when taking all medications as instructed             Post prandial:              Evening:             Before meals: Taking Insulin?: no             Long acting insulin:             Short acting insulin: Blood Pressure Monitoring: not checking Retinal Examination: Not Up to Date Foot Exam: Up to Date Pneumovax: refused Influenza: refused Aspirin: yes   HYPERTENSION / HYPERLIPIDEMIA Continues on Simvastatin and Benazepril. Does endorse missing doses of Simvastatin, takes about 4 times a week.   Satisfied with current treatment? yes Duration of hypertension: chronic BP  monitoring frequency: not checking BP range:  BP medication side effects: no Duration of hyperlipidemia: chronic Cholesterol medication side effects: no Cholesterol supplements: none Medication compliance: good compliance Aspirin: yes Recent stressors: no Recurrent headaches: no Visual changes: no Palpitations: no Dyspnea: no Chest pain: no Lower extremity edema: no Dizzy/lightheaded: no The 10-year ASCVD risk score Matthew Collier., et al., 2013) is: 15.2%   Values used to calculate the score:     Age: 41 years     Sex: Male     Is Non-Hispanic African American: No     Diabetic: Yes     Tobacco smoker: No     Systolic Blood Pressure: 384 mmHg     Is BP treated: Yes     HDL Cholesterol: 33 mg/dL     Total Cholesterol: 144 mg/dL   Relevant past medical, surgical, family and social history reviewed and updated as indicated. Interim medical history since our last visit reviewed. Allergies and medications reviewed and updated.  Review of Systems  Constitutional: Negative for activity change, diaphoresis, fatigue and fever.  Respiratory: Negative for cough, chest tightness,  shortness of breath and wheezing.   Cardiovascular: Negative for chest pain, palpitations and leg swelling.  Gastrointestinal: Negative.   Endocrine: Negative for cold intolerance, heat intolerance, polydipsia, polyphagia and polyuria.  Neurological: Negative.   Psychiatric/Behavioral: Negative.     Per HPI unless specifically indicated above     Objective:    BP 132/78 (BP Location: Left Arm)   Pulse 91   Temp 98.6 F (37 C) (Oral)   Wt 220 lb 6.4 oz (100 kg)   SpO2 97%   BMI 33.06 kg/m   Wt Readings from Last 3 Encounters:  04/03/20 220 lb 6.4 oz (100 kg)  02/21/20 219 lb (99.3 kg)  02/02/20 221 lb 2 oz (100.3 kg)    Physical Exam Vitals and nursing note reviewed.  Constitutional:      General: He is awake.     Appearance: He is well-developed and well-groomed. He is obese.  HENT:      Head: Normocephalic and atraumatic.     Right Ear: Hearing normal. No drainage.     Left Ear: Hearing normal. No drainage.  Eyes:     General: Lids are normal.        Right eye: No discharge.        Left eye: No discharge.     Conjunctiva/sclera: Conjunctivae normal.     Pupils: Pupils are equal, round, and reactive to light.  Neck:     Thyroid: No thyromegaly.     Vascular: No carotid bruit.     Trachea: Trachea normal.  Cardiovascular:     Rate and Rhythm: Normal rate and regular rhythm.     Heart sounds: Normal heart sounds, S1 normal and S2 normal. No murmur heard. No gallop.   Pulmonary:     Effort: Pulmonary effort is normal. No accessory muscle usage or respiratory distress.     Breath sounds: Normal breath sounds.  Abdominal:     General: Bowel sounds are normal.     Palpations: Abdomen is soft.     Tenderness: There is no abdominal tenderness.  Musculoskeletal:        General: Normal range of motion.     Cervical back: Normal range of motion and neck supple.     Right lower leg: No edema.     Left lower leg: No edema.  Skin:    General: Skin is warm and dry.     Capillary Refill: Capillary refill takes less than 2 seconds.     Findings: No rash.  Neurological:     Mental Status: He is alert and oriented to person, place, and time.     Deep Tendon Reflexes: Reflexes are normal and symmetric.     Reflex Scores:      Brachioradialis reflexes are 2+ on the right side and 2+ on the left side.      Patellar reflexes are 2+ on the right side and 2+ on the left side. Psychiatric:        Attention and Perception: Attention normal.        Mood and Affect: Mood normal.        Speech: Speech normal.        Behavior: Behavior normal. Behavior is cooperative.        Thought Content: Thought content normal.    Diabetic Foot Exam - Simple   Simple Foot Form Visual Inspection No deformities, no ulcerations, no other skin breakdown bilaterally: Yes Sensation Testing Intact  to touch and monofilament testing bilaterally: Yes  Pulse Check Posterior Tibialis and Dorsalis pulse intact bilaterally: Yes Comments    Results for orders placed or performed in visit on 02/02/20  Lipid panel  Result Value Ref Range   Cholesterol, Total 144 100 - 199 mg/dL   Triglycerides 245 (H) 0 - 149 mg/dL   HDL 33 (L) >39 mg/dL   VLDL Cholesterol Cal 40 5 - 40 mg/dL   LDL Chol Calc (NIH) 71 0 - 99 mg/dL   Chol/HDL Ratio 4.4 0.0 - 5.0 ratio      Assessment & Plan:   Problem List Items Addressed This Visit      Cardiovascular and Mediastinum   Hypertension associated with diabetes ([place])    Chronic, ongoing with BP at goal today.  Continue current medication with Benazepril for kidney protection, adjust as needed.  Recommend he monitor BP at home at least once a week and document.  Obtain labs today to include CMP and TSH.  Refills sent in.  Return in 3 months.      Relevant Medications   Dapagliflozin-metFORMIN HCl ER (XIGDUO XR) 05-998 MG TB24   benazepril (LOTENSIN) 40 MG tablet   simvastatin (ZOCOR) 40 MG tablet   Exenatide ER (BYDUREON) 2 MG PEN   Other Relevant Orders   Bayer DCA Hb A1c Waived   Microalbumin, Urine Waived   TSH   AMB Referral to [facility]     Endocrine   Type 2 diabetes mellitus with obesity ([place]) - Primary    Chronic, ongoing.  A1C downward trend today at 7.7%, still above goal <7% but improved from last 12.5%.  Discussed with patient at length.  Will continue current medication regimen at this time and recommend he consistently take Xigduo daily, no missing doses.  Urine ALB 10 and A:C 30-300 today, continue Benazepril for kidney protection.  CCM referral placed for further guidance and education + possible assistance due to cost concerns.  Goal is transition off Bydureon and change to Trulicity, which will allow for higher doses -- will see if cost effective option for patient.  Recommend he monitor BS at least a few times per day  and focus heavily on diet/exercise.  Return in 3 months, sooner if medication changes.      Relevant Medications   Dapagliflozin-metFORMIN HCl ER (XIGDUO XR) 05-998 MG TB24   benazepril (LOTENSIN) 40 MG tablet   simvastatin (ZOCOR) 40 MG tablet   Exenatide ER (BYDUREON) 2 MG PEN   Other Relevant Orders   Bayer DCA Hb A1c Waived   Microalbumin, Urine Waived   [facility]   Hyperlipidemia associated with type 2 diabetes mellitus (H[facility])    Chronic, ongoing.  Continue current medication regimen and adjust as needed.  Lipid panel today.  Recommend he consistently take statin, as currently not doing so and last levels above goal.      Relevant Medications   Dapagliflozin-metFORMIN HCl ER (XIGDUO XR) 05-998 MG TB24   benazepril (LOTENSIN) 40 MG tablet   simvastatin (ZOCOR) 40 MG tablet   Exenatide ER (BYDUREON) 2 MG PEN   Other Relevant Orders   Bayer DCA Hb A1c Waived   Comprehensive metabolic panel   Lipid Panel w/o Chol/HDL Ratio   AMB Referral to [place]     Other   Obese    BMI 33.06 today.  Recommended eating smaller high protein, low fat meals more frequently and exercising 30 mins a day 5 times a week with a goal of  10-15lb weight loss in the next 3 months. Patient voiced their understanding and motivation to adhere to these recommendations.       Relevant Medications   Dapagliflozin-metFORMIN HCl ER (XIGDUO XR) 05-998 MG TB24   Exenatide ER (BYDUREON) 2 MG PEN   Poor compliance with medication    CCM referral as cost is an issue.       Other Visit Diagnoses    B12 deficiency       History of low levels, recheck today and start supplement as needed.  Is on chronic Metformin.   Relevant Orders   B12   Prostate cancer screening       PSA screening on labs today.   Relevant Orders   PSA       Follow up plan: Return in about 3 months (around 07/04/2020) for T2DM, HTN/HLD.

## 2020-04-03 NOTE — Assessment & Plan Note (Signed)
Chronic, ongoing with BP at goal today.  Continue current medication with Benazepril for kidney protection, adjust as needed.  Recommend he monitor BP at home at least once a week and document.  Obtain labs today to include CMP and TSH.  Refills sent in.  Return in 3 months.

## 2020-04-03 NOTE — Patient Instructions (Signed)
Diabetes Mellitus and Nutrition, Adult When you have diabetes, or diabetes mellitus, it is very important to have healthy eating habits because your blood sugar (glucose) levels are greatly affected by what you eat and drink. Eating healthy foods in the right amounts, at about the same times every day, can help you:  Control your blood glucose.  Lower your risk of heart disease.  Improve your blood pressure.  Reach or maintain a healthy weight. What can affect my meal plan? Every person with diabetes is different, and each person has different needs for a meal plan. Your health care provider may recommend that you work with a dietitian to make a meal plan that is best for you. Your meal plan may vary depending on factors such as:  The calories you need.  The medicines you take.  Your weight.  Your blood glucose, blood pressure, and cholesterol levels.  Your activity level.  Other health conditions you have, such as heart or kidney disease. How do carbohydrates affect me? Carbohydrates, also called carbs, affect your blood glucose level more than any other type of food. Eating carbs naturally raises the amount of glucose in your blood. Carb counting is a method for keeping track of how many carbs you eat. Counting carbs is important to keep your blood glucose at a healthy level, especially if you use insulin or take certain oral diabetes medicines. It is important to know how many carbs you can safely have in each meal. This is different for every person. Your dietitian can help you calculate how many carbs you should have at each meal and for each snack. How does alcohol affect me? Alcohol can cause a sudden decrease in blood glucose (hypoglycemia), especially if you use insulin or take certain oral diabetes medicines. Hypoglycemia can be a life-threatening condition. Symptoms of hypoglycemia, such as sleepiness, dizziness, and confusion, are similar to symptoms of having too much  alcohol.  Do not drink alcohol if: ? Your health care provider tells you not to drink. ? You are pregnant, may be pregnant, or are planning to become pregnant.  If you drink alcohol: ? Do not drink on an empty stomach. ? Limit how much you use to:  0-1 drink a day for women.  0-2 drinks a day for men. ? Be aware of how much alcohol is in your drink. In the [place] one drink equals one 12 oz bottle of beer (355 mL), one 5 oz glass of wine (148 mL), or one 1 oz glass of hard liquor (44 mL). ? Keep yourself hydrated with water, diet soda, or unsweetened iced tea.  Keep in mind that regular soda, juice, and other mixers may contain a lot of sugar and must be counted as carbs. What are tips for following this plan? Reading food labels  Start by checking the serving size on the "Nutrition Facts" label of packaged foods and drinks. The amount of calories, carbs, fats, and other nutrients listed on the label is based on one serving of the item. Many items contain more than one serving per package.  Check the total grams (g) of carbs in one serving. You can calculate the number of servings of carbs in one serving by dividing the total carbs by 15. For example, if a food has 30 g of total carbs per serving, it would be equal to 2 servings of carbs.  Check the number of grams (g) of saturated fats and trans fats in one serving. Choose foods that have   a low amount or none of these fats.  Check the number of milligrams (mg) of salt (sodium) in one serving. Most people should limit total sodium intake to less than 2,300 mg per day.  Always check the nutrition information of foods labeled as "low-fat" or "nonfat." These foods may be higher in added sugar or refined carbs and should be avoided.  Talk to your dietitian to identify your daily goals for nutrients listed on the label. Shopping  Avoid buying canned, pre-made, or processed foods. These foods tend to be high in fat, sodium, and added  sugar.  Shop around the outside edge of the grocery store. This is where you will most often find fresh fruits and vegetables, bulk grains, fresh meats, and fresh dairy. Cooking  Use low-heat cooking methods, such as baking, instead of high-heat cooking methods like deep frying.  Cook using healthy oils, such as olive, canola, or sunflower oil.  Avoid cooking with butter, cream, or high-fat meats. Meal planning  Eat meals and snacks regularly, preferably at the same times every day. Avoid going long periods of time without eating.  Eat foods that are high in fiber, such as fresh fruits, vegetables, beans, and whole grains. Talk with your dietitian about how many servings of carbs you can eat at each meal.  Eat 4-6 oz (112-168 g) of lean protein each day, such as lean meat, chicken, fish, eggs, or tofu. One ounce (oz) of lean protein is equal to: ? 1 oz (28 g) of meat, chicken, or fish. ? 1 egg. ?  cup (62 g) of tofu.  Eat some foods each day that contain healthy fats, such as avocado, nuts, seeds, and fish.   What foods should I eat? Fruits Berries. Apples. Oranges. Peaches. Apricots. Plums. Grapes. Mango. Papaya. Pomegranate. Kiwi. Cherries. Vegetables Lettuce. Spinach. Leafy greens, including kale, chard, collard greens, and mustard greens. Beets. Cauliflower. Cabbage. Broccoli. Carrots. Green beans. Tomatoes. Peppers. Onions. Cucumbers. Brussels sprouts. Grains Whole grains, such as whole-wheat or whole-grain bread, crackers, tortillas, cereal, and pasta. Unsweetened oatmeal. Quinoa. Brown or wild rice. Meats and other proteins Seafood. Poultry without skin. Lean cuts of poultry and beef. Tofu. Nuts. Seeds. Dairy Low-fat or fat-free dairy products such as milk, yogurt, and cheese. The items listed above may not be a complete list of foods and beverages you can eat. Contact a dietitian for more information. What foods should I avoid? Fruits Fruits canned with  syrup. Vegetables Canned vegetables. Frozen vegetables with butter or cream sauce. Grains Refined white flour and flour products such as bread, pasta, snack foods, and cereals. Avoid all processed foods. Meats and other proteins Fatty cuts of meat. Poultry with skin. Breaded or fried meats. Processed meat. Avoid saturated fats. Dairy Full-fat yogurt, cheese, or milk. Beverages Sweetened drinks, such as soda or iced tea. The items listed above may not be a complete list of foods and beverages you should avoid. Contact a dietitian for more information. Questions to ask a health care provider  Do I need to meet with a diabetes educator?  Do I need to meet with a dietitian?  What number can I call if I have questions?  When are the best times to check my blood glucose? Where to find more information:  American Diabetes Association: diabetes.org  Academy of Nutrition and Dietetics: www.eatright.org  National Institute of Diabetes and Digestive and Kidney Diseases: www.niddk.nih.gov  Association of Diabetes Care and Education Specialists: www.diabeteseducator.org Summary  It is important to have healthy eating   habits because your blood sugar (glucose) levels are greatly affected by what you eat and drink.  A healthy meal plan will help you control your blood glucose and maintain a healthy lifestyle.  Your health care provider may recommend that you work with a dietitian to make a meal plan that is best for you.  Keep in mind that carbohydrates (carbs) and alcohol have immediate effects on your blood glucose levels. It is important to count carbs and to use alcohol carefully. This information is not intended to replace advice given to you by your health care provider. Make sure you discuss any questions you have with your health care provider. Document Revised: 12/01/2018 Document Reviewed: 12/01/2018 Elsevier Patient Education  2021 Elsevier Inc.  

## 2020-04-03 NOTE — Assessment & Plan Note (Signed)
Chronic, ongoing.  A1C downward trend today at 7.7%, still above goal <7% but improved from last 12.5%.  Discussed with patient at length.  Will continue current medication regimen at this time and recommend he consistently take Xigduo daily, no missing doses.  Urine ALB 10 and A:C 30-300 today, continue Benazepril for kidney protection.  CCM referral placed for further guidance and education + possible assistance due to cost concerns.  Goal is transition off Bydureon and change to Trulicity, which will allow for higher doses -- will see if cost effective option for patient.  Recommend he monitor BS at least a few times per day and focus heavily on diet/exercise.  Return in 3 months, sooner if medication changes.

## 2020-04-03 NOTE — Assessment & Plan Note (Signed)
BMI 33.06 today.  Recommended eating smaller high protein, low fat meals more frequently and exercising 30 mins a day 5 times a week with a goal of 10-15lb weight loss in the next 3 months. Patient voiced their understanding and motivation to adhere to these recommendations.

## 2020-04-03 NOTE — Assessment & Plan Note (Signed)
CCM referral as cost is an issue.

## 2020-04-03 NOTE — Assessment & Plan Note (Signed)
Chronic, ongoing.  Continue current medication regimen and adjust as needed.  Lipid panel today.  Recommend he consistently take statin, as currently not doing so and last levels above goal.

## 2020-04-04 ENCOUNTER — Telehealth: Payer: Self-pay

## 2020-04-04 ENCOUNTER — Encounter: Payer: Self-pay | Admitting: Nurse Practitioner

## 2020-04-04 DIAGNOSIS — E538 Deficiency of other specified B group vitamins: Secondary | ICD-10-CM | POA: Insufficient documentation

## 2020-04-04 LAB — COMPREHENSIVE METABOLIC PANEL
ALT: 21 IU/L (ref 0–44)
AST: 13 IU/L (ref 0–40)
Albumin/Globulin Ratio: 1.7 (ref 1.2–2.2)
Albumin: 4.5 g/dL (ref 3.8–4.9)
Alkaline Phosphatase: 56 IU/L (ref 44–121)
BUN/Creatinine Ratio: 17 (ref 9–20)
BUN: 19 mg/dL (ref 6–24)
Bilirubin Total: 0.4 mg/dL (ref 0.0–1.2)
CO2: 19 mmol/L — ABNORMAL LOW (ref 20–29)
Calcium: 9.7 mg/dL (ref 8.7–10.2)
Chloride: 103 mmol/L (ref 96–106)
Creatinine, Ser: 1.09 mg/dL (ref 0.76–1.27)
Globulin, Total: 2.6 g/dL (ref 1.5–4.5)
Glucose: 191 mg/dL — ABNORMAL HIGH (ref 65–99)
Potassium: 5.1 mmol/L (ref 3.5–5.2)
Sodium: 143 mmol/L (ref 134–144)
Total Protein: 7.1 g/dL (ref 6.0–8.5)
eGFR: 79 mL/min/{1.73_m2} (ref >59)

## 2020-04-04 LAB — MICROALBUMIN, URINE WAIVED
Creatinine, Urine Waived: 100 mg/dL (ref 10–300)
Microalb, Ur Waived: 10 mg/L (ref 0–19)
Microalb/Creat Ratio: <30 mg/g (ref <30)

## 2020-04-04 LAB — LIPID PANEL W/O CHOL/HDL RATIO
Cholesterol, Total: 197 mg/dL (ref 100–199)
HDL: 34 mg/dL — ABNORMAL LOW (ref >39)
LDL Chol Calc (NIH): 96 mg/dL (ref 0–99)
Triglycerides: 404 mg/dL — ABNORMAL HIGH (ref 0–149)
VLDL Cholesterol Cal: 67 mg/dL — ABNORMAL HIGH (ref 5–40)

## 2020-04-04 LAB — PSA: Prostate Specific Ag, Serum: 1.3 ng/mL (ref 0.0–4.0)

## 2020-04-04 LAB — VITAMIN B12: Vitamin B-12: 302 pg/mL (ref 232–1245)

## 2020-04-04 LAB — BAYER DCA HB A1C WAIVED: HB A1C (BAYER DCA - WAIVED): 7.7 % — ABNORMAL HIGH (ref <7.0)

## 2020-04-04 LAB — TSH: TSH: 0.632 u[IU]/mL (ref 0.450–4.500)

## 2020-04-04 NOTE — Progress Notes (Signed)
Contacted via [place] morning Mario, your labs have returned.  Glucose is trending down on these labs, which is wonderful.  Thyroid and prostate labs are normal.  B12 level is on lower end of normal, like to see 300 or greater and your level is 302.  I would recommend starting Vitamin B12 1000 MCG daily, which you can obtain in vitamin section as we discussed yesterday, see if this helps tingling in feet + continue to work on lowering blood sugars to help the tingling as well.  Cholesterol levels are still not at goal, I would recommend stopping Simvastatin and changing to Rosuvastatin 40 MG which is a bit stronger and may help lower levels more.  Are you okay with this change?  Let me know and I will send in.  Any questions? Keep being awesome!!  Thank you for allowing me to participate in your care. Kindest regards, Westyn Driggers

## 2020-04-04 NOTE — Chronic Care Management (AMB) (Signed)
  Care Management   Outreach Note  04/04/2020 Name: Matthew Collier MRN: [id number] DOB: Jun 03, 1965  Referred by: Charlynne Cousins, MD Reason for referral : Care Coordination (Outreach to schedule referral for Pharm D )   An unsuccessful telephone outreach was attempted today. The patient was referred to the case management team for assistance with care management and care coordination.   Follow Up Plan: A HIPAA compliant phone message was left for the patient providing contact information and requesting a return call.  The care management team will reach out to the patient again over the next 3 days.  If patient returns call to provider office, please advise to call [facility] at [place], [place], [place], [place] [address] Direct Dial: [phone] [email or web address]: [email or web address]

## 2020-04-11 NOTE — Chronic Care Management (AMB) (Signed)
  Care Management   Outreach Note  04/11/2020 Name: WILMA WUTHRICH MRN: [id number] DOB: 21-Feb-1965  Referred by: Charlynne Cousins, MD Reason for referral : Care Coordination (Outreach to schedule referral for Pharm D /Second Outreach to schedule referral for Pharm D )   A second unsuccessful telephone outreach was attempted today. The patient was referred to the case management team for assistance with care management and care coordination.   Follow Up Plan: A HIPAA compliant phone message was left for the patient providing contact information and requesting a return call.  The care management team will reach out to the patient again over the next 3 days.  If patient returns call to provider office, please advise to call [facility] at [place], [place], [place], [place] [address] Direct Dial: [phone] [email or web address]: [email or web address]

## 2020-04-14 NOTE — Chronic Care Management (AMB) (Signed)
  Care Management   Outreach Note  04/14/2020 Name: Matthew Collier MRN: [id number] DOB: September 13, 1965  Referred by: Charlynne Cousins, MD Reason for referral : Care Coordination (Outreach to schedule referral for Pharm D /Second Outreach to schedule referral for Pharm D /Third Outreach to schedule referral for Pharm D )   Third unsuccessful telephone outreach was attempted today. The patient was referred to the case management team for assistance with care management and care coordination. The patient's primary care provider has been notified of our unsuccessful attempts to make or maintain contact with the patient. The care management team is pleased to engage with this patient at any time in the future should he/she be interested in assistance from the care management team.   Follow Up Plan: We have been unable to make contact with the patient for follow up. The care management team is available to follow up with the patient after provider conversation with the patient regarding recommendation for care management engagement and subsequent re-referral to the care management team.   Noreene Larsson, [place], [place], [place] [address] Direct Dial: [phone] [email or web address]: [email or web address]

## 2020-07-03 ENCOUNTER — Telehealth: Payer: Self-pay

## 2020-07-03 ENCOUNTER — Encounter: Payer: Self-pay | Admitting: Internal Medicine

## 2020-07-03 NOTE — Telephone Encounter (Signed)
Called pt to r/s 6/28 appt no answer left vm

## 2020-07-04 ENCOUNTER — Ambulatory Visit: Payer: BC Managed Care – PPO | Admitting: Internal Medicine

## 2020-08-16 LAB — HM DIABETES EYE EXAM

## 2021-04-04 ENCOUNTER — Other Ambulatory Visit: Payer: Self-pay | Admitting: Nurse Practitioner

## 2021-04-04 DIAGNOSIS — E1169 Type 2 diabetes mellitus with other specified complication: Secondary | ICD-10-CM

## 2021-04-04 DIAGNOSIS — E669 Obesity, unspecified: Secondary | ICD-10-CM

## 2021-04-05 ENCOUNTER — Telehealth: Payer: Self-pay | Admitting: Internal Medicine

## 2021-04-05 NOTE — Telephone Encounter (Signed)
I called to set up an appt for his yearly physical.    Left voicemail to call back and make the appt.  30 day courtesy supply given for the Xigduo and Glucotrol. ?

## 2021-04-05 NOTE — Telephone Encounter (Signed)
Mandy from [facility] called in states that xigduo needs PA ?

## 2021-04-11 ENCOUNTER — Ambulatory Visit: Payer: BC Managed Care – PPO | Admitting: Internal Medicine

## 2021-04-11 ENCOUNTER — Telehealth: Payer: Self-pay | Admitting: Internal Medicine

## 2021-04-11 NOTE — Telephone Encounter (Signed)
Patient's wife called and asked about the medication that needs a PA. She says it's the combination pill with Metformin. Advised on 04/05/21, [facility] called the office and said Xigduo XR will need a PA. She says she's not understanding why this is needed each year, advised the PA only lasts for a designated time frame. She says she wonders if he should just go back on the Metformin until the PA is approved, but she will wait until his appointment and let him discuss with Dr. Neomia Dear.  ?

## 2021-04-11 NOTE — Telephone Encounter (Signed)
Pt wife is requesting a medication refill. When I asked for the name of the medication, she stated it needs a PA and she does not know the name.   ?I advised pt I could not send any request without the medication name. Pt wife stated she will call back with the medication name once he has a stroke.   ? ? ?I explained to pt wife i am not clinical, which is why we need the exact medication name.   ? ?Seeking clinical advice.  ?

## 2021-04-12 ENCOUNTER — Ambulatory Visit: Payer: BC Managed Care – PPO | Admitting: Internal Medicine

## 2021-04-23 ENCOUNTER — Ambulatory Visit: Payer: BC Managed Care – PPO | Admitting: Internal Medicine

## 2021-04-23 ENCOUNTER — Encounter: Payer: Self-pay | Admitting: Internal Medicine

## 2021-04-23 VITALS — BP 151/100 | HR 97 | Temp 98.7°F | Ht 68.47 in | Wt 238.8 lb

## 2021-04-23 DIAGNOSIS — E1159 Type 2 diabetes mellitus with other circulatory complications: Secondary | ICD-10-CM

## 2021-04-23 DIAGNOSIS — E1169 Type 2 diabetes mellitus with other specified complication: Secondary | ICD-10-CM | POA: Diagnosis not present

## 2021-04-23 DIAGNOSIS — E785 Hyperlipidemia, unspecified: Secondary | ICD-10-CM

## 2021-04-23 DIAGNOSIS — E538 Deficiency of other specified B group vitamins: Secondary | ICD-10-CM | POA: Diagnosis not present

## 2021-04-23 DIAGNOSIS — E669 Obesity, unspecified: Secondary | ICD-10-CM

## 2021-04-23 DIAGNOSIS — E611 Iron deficiency: Secondary | ICD-10-CM | POA: Diagnosis not present

## 2021-04-23 DIAGNOSIS — I152 Hypertension secondary to endocrine disorders: Secondary | ICD-10-CM

## 2021-04-23 LAB — BAYER DCA HB A1C WAIVED: HB A1C (BAYER DCA - WAIVED): 10.3 % — ABNORMAL HIGH (ref 4.8–5.6)

## 2021-04-23 MED ORDER — SEMAGLUTIDE (1 MG/DOSE) 4 MG/3ML ~~LOC~~ SOPN: 1.0000 mg | PEN_INJECTOR | SUBCUTANEOUS | 4 refills | Status: DC

## 2021-04-23 MED ORDER — METFORMIN HCL 500 MG PO TABS: 500.0000 mg | ORAL_TABLET | Freq: Three times a day (TID) | ORAL | 1 refills | Status: DC

## 2021-04-23 MED ORDER — SIMVASTATIN 40 MG PO TABS: 40.0000 mg | ORAL_TABLET | Freq: Every day | ORAL | 4 refills | Status: DC

## 2021-04-23 MED ORDER — TRULICITY 0.75 MG/0.5ML ~~LOC~~ SOAJ: 0.7500 mg | SUBCUTANEOUS | 6 refills | Status: DC

## 2021-04-23 NOTE — Progress Notes (Signed)
? ?BP (!) 151/100   Pulse 97   Temp 98.7 ?F (37.1 ?C) (Oral)   Ht 5' 8.47" (1.739 m)   Wt 238 lb 12.8 oz (108.3 kg)   SpO2 98%   BMI 35.82 kg/m?   ? ?Subjective:  ? ? Patient ID: Matthew Collier, male    DOB: 12-30-1965, 56 y.o.   MRN: [id number] ? ?Chief Complaint  ?Patient presents with  ? Diabetes  ? ? ?HPI: ?Matthew Collier is a 56 y.o. male ? ?HPI ? ?Chief Complaint  ?Patient presents with  ? Diabetes  ? ? ?Relevant past medical, surgical, family and social history reviewed and updated as indicated. Interim medical history since our last visit reviewed. ?Allergies and medications reviewed and updated. ? ?Review of Systems ? ?Per HPI unless specifically indicated above ? ?   ?Objective:  ?  ?BP (!) 151/100   Pulse 97   Temp 98.7 ?F (37.1 ?C) (Oral)   Ht 5' 8.47" (1.739 m)   Wt 238 lb 12.8 oz (108.3 kg)   SpO2 98%   BMI 35.82 kg/m?   ?Wt Readings from Last 3 Encounters:  ?04/23/21 238 lb 12.8 oz (108.3 kg)  ?04/03/20 220 lb 6.4 oz (100 kg)  ?02/21/20 219 lb (99.3 kg)  ?  ?Physical Exam ? ?Results for orders placed or performed in visit on 04/23/21  ?Bayer DCA Hb A1c Waived  ?Result Value Ref Range  ? HB A1C (BAYER DCA - WAIVED) 10.3 (H) 4.8 - 5.6 %  ? ?   ? ? ?Current Outpatient Medications:  ?  benazepril (LOTENSIN) 40 MG tablet, Take 1 tablet (40 mg total) by mouth daily., Disp: 90 tablet, Rfl: 4 ?  Dapagliflozin-metFORMIN HCl ER (XIGDUO XR) 05-998 MG TB24, TAKE 1 TABLET BY MOUTH TWICE DAILY, Disp: 60 tablet, Rfl: 0 ?  Dulaglutide (TRULICITY) 3.55 HR/4.1UL SOPN, Inject 0.75 mg into the skin once a week., Disp: 0.5 mL, Rfl: 6 ?  ferrous sulfate 325 (65 FE) MG tablet, Take 1 tablet (325 mg total) by mouth 2 (two) times daily with a meal., Disp: 60 tablet, Rfl: 6 ?  glipiZIDE (GLUCOTROL) 5 MG tablet, TAKE 1 TABLET BY MOUTH ONCE DAILY AS NEEDED, Disp: 30 tablet, Rfl: 0 ?  ONE TOUCH ULTRA TEST test strip, USE AS DIRECTED. NEED NEW INSURANCE., Disp: 100 each, Rfl: 12 ?  pantoprazole (PROTONIX) 20 MG tablet, Take  1 tablet (20 mg total) by mouth daily., Disp: 90 tablet, Rfl: 4 ?  simvastatin (ZOCOR) 40 MG tablet, Take 1 tablet (40 mg total) by mouth daily., Disp: 90 tablet, Rfl: 4  ? ? ?Assessment & Plan:  ?DM: Will need to a1c 10.3 bydureon wasn't covered sec to insurance neither was xigduo.  ?check HbA1c,  urine  microalbumin  diabetic diet plan given to pt  adviced regarding hypoglycemia and instructions given to pt today on how to prevent and treat the same if it were to occur. pt acknowledges the plan and voices understanding of the same.  exercise plan given and encouraged.   advice diabetic yearly podiatry, ophthalmology , nutritionist , dental check q 6 months ? ?2. HLD : ?recheck FLP, check LFT's work on diet, SE of meds explained to pt. low fat and high fiber diet explained to pt. ? Latest Reference Range & Units 08/13/19 09:32 02/02/20 16:23 04/03/20 15:55 04/03/20 15:59  ?Cholesterol, Total 100 - 199 mg/dL 241 (H) 144  197  ?HDL Cholesterol >39 mg/dL 33 (L) 33 (L)  34 (L)  ?  MICROALB/CREAT RATIO <30 mg/g   <30   ?Triglycerides 0 - 149 mg/dL 716 (HH) 245 (H)  404 (H)  ?VLDL Cholesterol Cal 5 - 40 mg/dL 119 (H) 40  67 (H)  ?LDL Chol Calc (NIH) 0 - 99 mg/dL 89 71  96  ?[facility]): Data is critically high ?(H): Data is abnormally high ?(L): Data is abnormally low ? ?Problem List Items Addressed This Visit   ? ?  ? Cardiovascular and Mediastinum  ? Hypertension associated with diabetes ([place]) - Primary  ? Relevant Medications  ? Dulaglutide (TRULICITY) 5.17 OH/6.0VP SOPN  ? Other Relevant Orders  ? PSA Total+%Free (Serial)  ? Comprehensive metabolic panel  ? CBC with Differential/Platelet  ? Thyroid Panel With TSH  ? Bayer DCA Hb A1c Waived (Completed)  ? B12  ? Ferritin  ? Ambulatory referral to Gastroenterology  ?  ? Endocrine  ? Type 2 diabetes mellitus with obesity ([place])  ? Relevant Medications  ? Dulaglutide (TRULICITY) 7.10 GY/6.9SW SOPN  ? Other Relevant Orders  ? PSA Total+%Free (Serial)  ? Comprehensive metabolic  panel  ? CBC with Differential/Platelet  ? Thyroid Panel With TSH  ? Bayer DCA Hb A1c Waived (Completed)  ? B12  ? Ferritin  ? Ambulatory referral to Gastroenterology  ?  ? Other  ? Iron deficiency  ? Relevant Orders  ? PSA Total+%Free (Serial)  ? Comprehensive metabolic panel  ? CBC with Differential/Platelet  ? Thyroid Panel With TSH  ? Bayer DCA Hb A1c Waived (Completed)  ? B12  ? Ferritin  ? Ambulatory referral to Gastroenterology  ? B12 deficiency  ? Relevant Orders  ? PSA Total+%Free (Serial)  ? Comprehensive metabolic panel  ? CBC with Differential/Platelet  ? Thyroid Panel With TSH  ? Bayer DCA Hb A1c Waived (Completed)  ? B12  ? Ferritin  ? Ambulatory referral to Gastroenterology  ?  ? ?Orders Placed This Encounter  ?Procedures  ? PSA Total+%Free (Serial)  ? Comprehensive metabolic panel  ? CBC with Differential/Platelet  ? Thyroid Panel With TSH  ? Bayer DCA Hb A1c Waived  ? B12  ? Ferritin  ? Ambulatory referral to Gastroenterology  ?  ? ?Meds ordered this encounter  ?Medications  ? Dulaglutide (TRULICITY) 5.46 EV/0.3JK SOPN  ?  Sig: Inject 0.75 mg into the skin once a week.  ?  Dispense:  0.5 mL  ?  Refill:  6  ?  ? ?Follow up plan: ?No follow-ups on file. ? ? ?

## 2021-04-24 LAB — COMPREHENSIVE METABOLIC PANEL
ALT: 24 IU/L (ref 0–44)
AST: 17 IU/L (ref 0–40)
Albumin/Globulin Ratio: 1.9 (ref 1.2–2.2)
Albumin: 4.3 g/dL (ref 3.8–4.9)
Alkaline Phosphatase: 76 IU/L (ref 44–121)
BUN/Creatinine Ratio: 21 — ABNORMAL HIGH (ref 9–20)
BUN: 13 mg/dL (ref 6–24)
Bilirubin Total: 0.6 mg/dL (ref 0.0–1.2)
CO2: 21 mmol/L (ref 20–29)
Calcium: 9 mg/dL (ref 8.7–10.2)
Chloride: 100 mmol/L (ref 96–106)
Creatinine, Ser: 0.63 mg/dL — ABNORMAL LOW (ref 0.76–1.27)
Globulin, Total: 2.3 g/dL (ref 1.5–4.5)
Glucose: 308 mg/dL — ABNORMAL HIGH (ref 70–99)
Potassium: 4.1 mmol/L (ref 3.5–5.2)
Sodium: 138 mmol/L (ref 134–144)
Total Protein: 6.6 g/dL (ref 6.0–8.5)
eGFR: 110 mL/min/{1.73_m2} (ref >59)

## 2021-04-24 LAB — PSA TOTAL+% FREE (SERIAL)
PSA, Free Pct: 20 %
PSA, Free: 0.3 ng/mL
Prostate Specific Ag, Serum: 1.5 ng/mL (ref 0.0–4.0)

## 2021-04-24 LAB — CBC WITH DIFFERENTIAL/PLATELET
Basophils Absolute: 0 10*3/uL (ref 0.0–0.2)
Basos: 1 %
EOS (ABSOLUTE): 0.1 10*3/uL (ref 0.0–0.4)
Eos: 2 %
Hematocrit: 48 % (ref 37.5–51.0)
Hemoglobin: 17 g/dL (ref 13.0–17.7)
Immature Grans (Abs): 0 10*3/uL (ref 0.0–0.1)
Immature Granulocytes: 1 %
Lymphocytes Absolute: 0.6 10*3/uL — ABNORMAL LOW (ref 0.7–3.1)
Lymphs: 13 %
MCH: 32.8 pg (ref 26.6–33.0)
MCHC: 35.4 g/dL (ref 31.5–35.7)
MCV: 93 fL (ref 79–97)
Monocytes Absolute: 0.6 10*3/uL (ref 0.1–0.9)
Monocytes: 14 %
Neutrophils Absolute: 3.1 10*3/uL (ref 1.4–7.0)
Neutrophils: 69 %
Platelets: 155 10*3/uL (ref 150–450)
RBC: 5.18 x10E6/uL (ref 4.14–5.80)
RDW: 11.7 % (ref 11.6–15.4)
WBC: 4.4 10*3/uL (ref 3.4–10.8)

## 2021-04-24 LAB — THYROID PANEL WITH TSH
Free Thyroxine Index: 1.8 (ref 1.2–4.9)
T3 Uptake Ratio: 28 % (ref 24–39)
T4, Total: 6.4 ug/dL (ref 4.5–12.0)
TSH: 0.504 u[IU]/mL (ref 0.450–4.500)

## 2021-04-24 LAB — FERRITIN: Ferritin: 285 ng/mL (ref 30–400)

## 2021-04-24 LAB — VITAMIN B12: Vitamin B-12: 271 pg/mL (ref 232–1245)

## 2021-04-25 ENCOUNTER — Encounter: Payer: Self-pay | Admitting: Internal Medicine

## 2021-04-25 ENCOUNTER — Ambulatory Visit: Payer: BC Managed Care – PPO | Admitting: Internal Medicine

## 2021-04-25 VITALS — BP 162/94 | HR 80 | Temp 98.9°F | Ht 70.98 in | Wt 221.8 lb

## 2021-04-25 DIAGNOSIS — J069 Acute upper respiratory infection, unspecified: Secondary | ICD-10-CM

## 2021-04-25 MED ORDER — AZITHROMYCIN 250 MG PO TABS: ORAL_TABLET | ORAL | 0 refills | Status: AC

## 2021-04-25 MED ORDER — FEXOFENADINE HCL 180 MG PO TABS: 180.0000 mg | ORAL_TABLET | Freq: Every day | ORAL | 1 refills | Status: DC

## 2021-04-25 MED ORDER — GUAIFENESIN-CODEINE 100-10 MG/5ML PO SYRP: 5.0000 mL | ORAL_SOLUTION | Freq: Every evening | ORAL | 0 refills | Status: DC

## 2021-04-25 MED ORDER — BENZONATATE 100 MG PO CAPS: 100.0000 mg | ORAL_CAPSULE | Freq: Two times a day (BID) | ORAL | 0 refills | Status: DC | PRN

## 2021-04-25 NOTE — Progress Notes (Signed)
? ?BP (!) 162/94   Pulse 80   Temp 98.9 ?F (37.2 ?C) (Oral)   Ht 5' 10.98" (1.803 m)   Wt 221 lb 12.8 oz (100.6 kg)   SpO2 98%   BMI 30.95 kg/m?   ? ?Subjective:  ? ? Patient ID: Matthew Collier, male    DOB: Dec 07, 1965, 56 y.o.   MRN: [id number] ? ?Chief Complaint  ?Patient presents with  ?? Cough  ?  Headache, puffy watery eyes, cough, fatigue, head congestion, no appetite, diarrhea since Friday, symptoms got worse on Tuesday  ? ? ?HPI: ?Matthew Collier is a 56 y.o. male ? ?Cough ?This is a new (clear phelgm per pt has been coughing feels hot and chills but no fever sore throat x 2 days ago) problem. Associated symptoms include chills and headaches. Pertinent negatives include no chest pain, ear congestion, ear pain, fever, heartburn, hemoptysis, myalgias, nasal congestion, rhinorrhea, sore throat or sweats.  ? ?Chief Complaint  ?Patient presents with  ?? Cough  ?  Headache, puffy watery eyes, cough, fatigue, head congestion, no appetite, diarrhea since Friday, symptoms got worse on Tuesday  ? ? ?Relevant past medical, surgical, family and social history reviewed and updated as indicated. Interim medical history since our last visit reviewed. ?Allergies and medications reviewed and updated. ? ?Review of Systems  ?Constitutional:  Positive for chills. Negative for fever.  ?HENT:  Negative for ear pain, rhinorrhea and sore throat.   ?Respiratory:  Positive for cough. Negative for hemoptysis.   ?Cardiovascular:  Negative for chest pain.  ?Gastrointestinal:  Negative for heartburn.  ?Musculoskeletal:  Negative for myalgias.  ?Neurological:  Positive for headaches.  ? ?Per HPI unless specifically indicated above ? ?   ?Objective:  ?  ?BP (!) 162/94   Pulse 80   Temp 98.9 ?F (37.2 ?C) (Oral)   Ht 5' 10.98" (1.803 m)   Wt 221 lb 12.8 oz (100.6 kg)   SpO2 98%   BMI 30.95 kg/m?   ?Wt Readings from Last 3 Encounters:  ?04/25/21 221 lb 12.8 oz (100.6 kg)  ?04/23/21 238 lb 12.8 oz (108.3 kg)  ?04/03/20 220 lb 6.4 oz (100  kg)  ?  ?Physical Exam ?Vitals and nursing note reviewed.  ?Constitutional:   ?   General: He is not in acute distress. ?   Appearance: Normal appearance. He is not ill-appearing or diaphoretic.  ?HENT:  ?   Head: Normocephalic and atraumatic.  ?   Right Ear: Tympanic membrane and external ear normal. There is no impacted cerumen.  ?   Left Ear: External ear normal.  ?   Nose: No congestion or rhinorrhea.  ?   Mouth/Throat:  ?   Pharynx: No oropharyngeal exudate or posterior oropharyngeal erythema.  ?Eyes:  ?   Conjunctiva/sclera: Conjunctivae normal.  ?   Pupils: Pupils are equal, round, and reactive to light.  ?Cardiovascular:  ?   Rate and Rhythm: Normal rate and regular rhythm.  ?   Heart sounds: No murmur heard. ?  No friction rub. No gallop.  ?Pulmonary:  ?   Effort: No respiratory distress.  ?   Breath sounds: No stridor. No wheezing or rhonchi.  ?Chest:  ?   Chest wall: No tenderness.  ?Neurological:  ?   Mental Status: He is alert.  ? ? ?Results for orders placed or performed in visit on 04/25/21  ?Rapid Strep Screen ([facility] ONLY)  ? Specimen: Other  ? Other  ?Result Value Ref Range  ?  Strep Gp A Ag, IA W/Reflex Negative Negative  ?Culture, Group A Strep  ? Other  ?Result Value Ref Range  ? Strep A Culture WILL FOLLOW   ?Veritor Flu A/B Waived  ?Result Value Ref Range  ? Influenza A Negative Negative  ? Influenza B Negative Negative  ? ?   ? ? ?Current Outpatient Medications:  ??  azithromycin (ZITHROMAX) 250 MG tablet, Take 2 tablets on day 1, then 1 tablet daily on days 2 through 5, Disp: 6 tablet, Rfl: 0 ??  benazepril (LOTENSIN) 40 MG tablet, Take 1 tablet (40 mg total) by mouth daily., Disp: 90 tablet, Rfl: 4 ??  benzonatate (TESSALON) 100 MG capsule, Take 1 capsule (100 mg total) by mouth 2 (two) times daily as needed for cough., Disp: 20 capsule, Rfl: 0 ??  ferrous sulfate 325 (65 FE) MG tablet, Take 1 tablet (325 mg total) by mouth 2 (two) times daily with a meal., Disp: 60 tablet, Rfl: 6 ??   fexofenadine (ALLEGRA ALLERGY) 180 MG tablet, Take 1 tablet (180 mg total) by mouth daily., Disp: 10 tablet, Rfl: 1 ??  guaiFENesin-codeine (ROBITUSSIN AC) 100-10 MG/5ML syrup, Take 5 mLs by mouth every evening., Disp: 120 mL, Rfl: 0 ??  metFORMIN (GLUCOPHAGE) 500 MG tablet, Take 1 tablet (500 mg total) by mouth in the morning, at noon, and at bedtime., Disp: 90 tablet, Rfl: 1 ??  ONE TOUCH ULTRA TEST test strip, USE AS DIRECTED. NEED NEW INSURANCE., Disp: 100 each, Rfl: 12 ??  pantoprazole (PROTONIX) 20 MG tablet, Take 1 tablet (20 mg total) by mouth daily., Disp: 90 tablet, Rfl: 4 ??  Semaglutide, 1 MG/DOSE, 4 MG/3ML SOPN, Inject 1 mg as directed once a week., Disp: 3 mL, Rfl: 4 ??  simvastatin (ZOCOR) 40 MG tablet, Take 1 tablet (40 mg total) by mouth daily., Disp: 90 tablet, Rfl: 4  ? ? ?Assessment & Plan:  ?URI: Flu and strep ordered at this visit, both negative. pt advised to take Tylenol q 4- 6 hourly as needed. pt to take allegra q pm as needed and to call office if symptoms worsened pt verbalised understanding of such. ? ?  ?Problem List Items Addressed This Visit   ?None ?Visit Diagnoses   ? ? Upper respiratory tract infection, unspecified type    -  Primary  ? Relevant Medications  ? azithromycin (ZITHROMAX) 250 MG tablet  ? Other Relevant Orders  ? Veritor Flu A/B Waived (Completed)  ? Rapid Strep Screen ([facility] ONLY) (Completed)  ? Novel Coronavirus, NAA ([facility])  ? ?  ?  ? ?Orders Placed This Encounter  ?Procedures  ?? Rapid Strep Screen ([facility] ONLY)  ?? Novel Coronavirus, NAA ([facility])  ?? Culture, Group A Strep  ?? Veritor Flu A/B Waived  ?  ? ?Meds ordered this encounter  ?Medications  ?? azithromycin (ZITHROMAX) 250 MG tablet  ?  Sig: Take 2 tablets on day 1, then 1 tablet daily on days 2 through 5  ?  Dispense:  6 tablet  ?  Refill:  0  ?? fexofenadine (ALLEGRA ALLERGY) 180 MG tablet  ?  Sig: Take 1 tablet (180 mg total) by mouth daily.  ?  Dispense:  10 tablet  ?  Refill:  1   ?? benzonatate (TESSALON) 100 MG capsule  ?  Sig: Take 1 capsule (100 mg total) by mouth 2 (two) times daily as needed for cough.  ?  Dispense:  20 capsule  ?  Refill:  0  ?? guaiFENesin-codeine (ROBITUSSIN AC) 100-10 MG/5ML syrup  ?  Sig: Take 5 mLs by mouth every evening.  ?  Dispense:  120 mL  ?  Refill:  0  ?  ? ?Follow up plan: ?No follow-ups on file. ? ? ?

## 2021-04-26 DIAGNOSIS — J069 Acute upper respiratory infection, unspecified: Secondary | ICD-10-CM | POA: Insufficient documentation

## 2021-04-27 LAB — NOVEL CORONAVIRUS, NAA: SARS-CoV-2, NAA: NOT DETECTED

## 2021-04-28 LAB — VERITOR FLU A/B WAIVED
Influenza A: NEGATIVE
Influenza B: NEGATIVE

## 2021-04-28 LAB — RAPID STREP SCREEN (MED CTR MEBANE ONLY): Strep Gp A Ag, IA W/Reflex: NEGATIVE

## 2021-04-28 LAB — CULTURE, GROUP A STREP: Strep A Culture: NEGATIVE

## 2021-05-14 ENCOUNTER — Other Ambulatory Visit: Payer: BC Managed Care – PPO

## 2021-05-14 DIAGNOSIS — E1169 Type 2 diabetes mellitus with other specified complication: Secondary | ICD-10-CM

## 2021-05-14 LAB — BAYER DCA HB A1C WAIVED: HB A1C (BAYER DCA - WAIVED): 9 % — ABNORMAL HIGH (ref 4.8–5.6)

## 2021-05-15 LAB — LIPID PANEL
Chol/HDL Ratio: 3.5 ratio (ref 0.0–5.0)
Cholesterol, Total: 137 mg/dL (ref 100–199)
HDL: 39 mg/dL — ABNORMAL LOW (ref >39)
LDL Chol Calc (NIH): 67 mg/dL (ref 0–99)
Triglycerides: 186 mg/dL — ABNORMAL HIGH (ref 0–149)
VLDL Cholesterol Cal: 31 mg/dL (ref 5–40)

## 2021-05-15 LAB — COMPREHENSIVE METABOLIC PANEL
ALT: 31 IU/L (ref 0–44)
AST: 28 IU/L (ref 0–40)
Albumin/Globulin Ratio: 2 (ref 1.2–2.2)
Albumin: 4.6 g/dL (ref 3.8–4.9)
Alkaline Phosphatase: 65 IU/L (ref 44–121)
BUN/Creatinine Ratio: 13 (ref 9–20)
BUN: 10 mg/dL (ref 6–24)
Bilirubin Total: 0.5 mg/dL (ref 0.0–1.2)
CO2: 17 mmol/L — ABNORMAL LOW (ref 20–29)
Calcium: 9.4 mg/dL (ref 8.7–10.2)
Chloride: 100 mmol/L (ref 96–106)
Creatinine, Ser: 0.75 mg/dL — ABNORMAL LOW (ref 0.76–1.27)
Globulin, Total: 2.3 g/dL (ref 1.5–4.5)
Glucose: 198 mg/dL — ABNORMAL HIGH (ref 70–99)
Potassium: 4.5 mmol/L (ref 3.5–5.2)
Sodium: 139 mmol/L (ref 134–144)
Total Protein: 6.9 g/dL (ref 6.0–8.5)
eGFR: 105 mL/min/{1.73_m2} (ref >59)

## 2021-05-16 ENCOUNTER — Observation Stay: Payer: BC Managed Care – PPO

## 2021-05-16 ENCOUNTER — Encounter: Payer: Self-pay | Admitting: Family Medicine

## 2021-05-16 ENCOUNTER — Encounter
Admission: EM | Disposition: A | Discharge status: Home / Self Care | Payer: Self-pay | Source: Home / Self Care | Attending: Student

## 2021-05-16 ENCOUNTER — Inpatient Hospital Stay
Admission: EM | Admit: 2021-05-16 | Discharge: 2021-05-18 | DRG: 982 | Disposition: A | Discharge status: Home / Self Care | Payer: BC Managed Care – PPO | Attending: Student | Admitting: Student

## 2021-05-16 DIAGNOSIS — Z7984 Long term (current) use of oral hypoglycemic drugs: Secondary | ICD-10-CM

## 2021-05-16 DIAGNOSIS — K921 Melena: Secondary | ICD-10-CM | POA: Diagnosis present

## 2021-05-16 DIAGNOSIS — K5730 Diverticulosis of large intestine without perforation or abscess without bleeding: Secondary | ICD-10-CM | POA: Diagnosis not present

## 2021-05-16 DIAGNOSIS — I152 Hypertension secondary to endocrine disorders: Secondary | ICD-10-CM

## 2021-05-16 DIAGNOSIS — K922 Gastrointestinal hemorrhage, unspecified: Secondary | ICD-10-CM | POA: Diagnosis present

## 2021-05-16 DIAGNOSIS — K5791 Diverticulosis of intestine, part unspecified, without perforation or abscess with bleeding: Secondary | ICD-10-CM

## 2021-05-16 DIAGNOSIS — I959 Hypotension, unspecified: Secondary | ICD-10-CM | POA: Diagnosis present

## 2021-05-16 DIAGNOSIS — Z880 Allergy status to penicillin: Secondary | ICD-10-CM

## 2021-05-16 DIAGNOSIS — Z8616 Personal history of COVID-19: Secondary | ICD-10-CM | POA: Diagnosis not present

## 2021-05-16 DIAGNOSIS — Z8249 Family history of ischemic heart disease and other diseases of the circulatory system: Secondary | ICD-10-CM | POA: Diagnosis not present

## 2021-05-16 DIAGNOSIS — Z8719 Personal history of other diseases of the digestive system: Secondary | ICD-10-CM

## 2021-05-16 DIAGNOSIS — E119 Type 2 diabetes mellitus without complications: Secondary | ICD-10-CM

## 2021-05-16 DIAGNOSIS — K2270 Barrett's esophagus without dysplasia: Secondary | ICD-10-CM | POA: Diagnosis present

## 2021-05-16 DIAGNOSIS — Z79899 Other long term (current) drug therapy: Secondary | ICD-10-CM | POA: Diagnosis not present

## 2021-05-16 DIAGNOSIS — K5731 Diverticulosis of large intestine without perforation or abscess with bleeding: Principal | ICD-10-CM | POA: Diagnosis present

## 2021-05-16 DIAGNOSIS — Z833 Family history of diabetes mellitus: Secondary | ICD-10-CM

## 2021-05-16 DIAGNOSIS — I10 Essential (primary) hypertension: Secondary | ICD-10-CM | POA: Diagnosis present

## 2021-05-16 DIAGNOSIS — D62 Acute posthemorrhagic anemia: Secondary | ICD-10-CM | POA: Diagnosis present

## 2021-05-16 DIAGNOSIS — E785 Hyperlipidemia, unspecified: Secondary | ICD-10-CM | POA: Diagnosis present

## 2021-05-16 DIAGNOSIS — Z6831 Body mass index (BMI) 31.0-31.9, adult: Secondary | ICD-10-CM

## 2021-05-16 DIAGNOSIS — E861 Hypovolemia: Secondary | ICD-10-CM | POA: Diagnosis present

## 2021-05-16 DIAGNOSIS — Z86010 Personal history of colonic polyps: Secondary | ICD-10-CM | POA: Diagnosis not present

## 2021-05-16 DIAGNOSIS — E1159 Type 2 diabetes mellitus with other circulatory complications: Secondary | ICD-10-CM

## 2021-05-16 DIAGNOSIS — K5793 Diverticulitis of intestine, part unspecified, without perforation or abscess with bleeding: Secondary | ICD-10-CM

## 2021-05-16 DIAGNOSIS — E669 Obesity, unspecified: Secondary | ICD-10-CM | POA: Diagnosis present

## 2021-05-16 HISTORY — PX: EMBOLIZATION: CATH118239

## 2021-05-16 HISTORY — DX: Gastrointestinal hemorrhage, unspecified: K92.2

## 2021-05-16 LAB — CBC WITH DIFFERENTIAL/PLATELET
Abs Immature Granulocytes: 0.02 10*3/uL (ref 0.00–0.07)
Basophils Absolute: 0.1 10*3/uL (ref 0.0–0.1)
Basophils Relative: 1 %
Eosinophils Absolute: 0.1 10*3/uL (ref 0.0–0.5)
Eosinophils Relative: 1 %
HCT: 42.6 % (ref 39.0–52.0)
Hemoglobin: 15 g/dL (ref 13.0–17.0)
Immature Granulocytes: 0 %
Lymphocytes Relative: 34 %
Lymphs Abs: 2.3 10*3/uL (ref 0.7–4.0)
MCH: 32.1 pg (ref 26.0–34.0)
MCHC: 35.2 g/dL (ref 30.0–36.0)
MCV: 91.2 fL (ref 80.0–100.0)
Monocytes Absolute: 0.6 10*3/uL (ref 0.1–1.0)
Monocytes Relative: 9 %
Neutro Abs: 3.6 10*3/uL (ref 1.7–7.7)
Neutrophils Relative %: 55 %
Platelets: 206 10*3/uL (ref 150–400)
RBC: 4.67 MIL/uL (ref 4.22–5.81)
RDW: 11.7 % (ref 11.5–15.5)
WBC: 6.7 10*3/uL (ref 4.0–10.5)
nRBC: 0 % (ref 0.0–0.2)

## 2021-05-16 LAB — COMPREHENSIVE METABOLIC PANEL
ALT: 28 U/L (ref 0–44)
AST: 22 U/L (ref 15–41)
Albumin: 4 g/dL (ref 3.5–5.0)
Alkaline Phosphatase: 53 U/L (ref 38–126)
Anion gap: 7 (ref 5–15)
BUN: 13 mg/dL (ref 6–20)
CO2: 24 mmol/L (ref 22–32)
Calcium: 9 mg/dL (ref 8.9–10.3)
Chloride: 107 mmol/L (ref 98–111)
Creatinine, Ser: 0.7 mg/dL (ref 0.61–1.24)
GFR, Estimated: >60 mL/min (ref >60)
Glucose, Bld: 197 mg/dL — ABNORMAL HIGH (ref 70–99)
Potassium: 4.1 mmol/L (ref 3.5–5.1)
Sodium: 138 mmol/L (ref 135–145)
Total Bilirubin: 0.5 mg/dL (ref 0.3–1.2)
Total Protein: 6.6 g/dL (ref 6.5–8.1)

## 2021-05-16 LAB — TROPONIN I (HIGH SENSITIVITY)
Troponin I (High Sensitivity): 3 ng/L (ref <18)
Troponin I (High Sensitivity): 3 ng/L (ref <18)

## 2021-05-16 LAB — URINALYSIS, ROUTINE W REFLEX MICROSCOPIC
Bilirubin Urine: NEGATIVE
Glucose, UA: NEGATIVE mg/dL
Hgb urine dipstick: NEGATIVE
Ketones, ur: NEGATIVE mg/dL
Leukocytes,Ua: NEGATIVE
Nitrite: NEGATIVE
Protein, ur: NEGATIVE mg/dL
Specific Gravity, Urine: 1.023 (ref 1.005–1.030)
pH: 5 (ref 5.0–8.0)

## 2021-05-16 LAB — TYPE AND SCREEN
ABO/RH(D): O NEG
Antibody Screen: NEGATIVE

## 2021-05-16 LAB — GLUCOSE, CAPILLARY
Glucose-Capillary: 170 mg/dL — ABNORMAL HIGH (ref 70–99)
Glucose-Capillary: 137 mg/dL — ABNORMAL HIGH (ref 70–99)
Glucose-Capillary: 214 mg/dL — ABNORMAL HIGH (ref 70–99)

## 2021-05-16 LAB — HEMOGLOBIN AND HEMATOCRIT, BLOOD
HCT: 37.6 % — ABNORMAL LOW (ref 39.0–52.0)
HCT: 33.2 % — ABNORMAL LOW (ref 39.0–52.0)
HCT: 30.9 % — ABNORMAL LOW (ref 39.0–52.0)
Hemoglobin: 13.2 g/dL (ref 13.0–17.0)
Hemoglobin: 11.5 g/dL — ABNORMAL LOW (ref 13.0–17.0)
Hemoglobin: 10.7 g/dL — ABNORMAL LOW (ref 13.0–17.0)

## 2021-05-16 LAB — HIV ANTIBODY (ROUTINE TESTING W REFLEX): HIV Screen 4th Generation wRfx: NONREACTIVE

## 2021-05-16 LAB — CBG MONITORING, ED
Glucose-Capillary: 241 mg/dL — ABNORMAL HIGH (ref 70–99)
Glucose-Capillary: 237 mg/dL — ABNORMAL HIGH (ref 70–99)

## 2021-05-16 LAB — MRSA NEXT GEN BY PCR, NASAL: MRSA by PCR Next Gen: NOT DETECTED

## 2021-05-16 LAB — LIPASE, BLOOD: Lipase: 33 U/L (ref 11–51)

## 2021-05-16 SURGERY — EMBOLIZATION
Anesthesia: Moderate Sedation

## 2021-05-16 MED ORDER — OXYCODONE HCL 5 MG PO TABS: 5.0000 mg | ORAL_TABLET | ORAL | Status: DC | PRN

## 2021-05-16 MED ORDER — DIPHENHYDRAMINE HCL 50 MG/ML IJ SOLN
50.0000 mg | Freq: Once | INTRAMUSCULAR | Status: DC | PRN
Start: 2021-05-16 — End: 2021-05-16

## 2021-05-16 MED ORDER — SODIUM CHLORIDE 0.9 % IV BOLUS
1000.0000 mL | Freq: Once | INTRAVENOUS | Status: AC
  Administered 2021-05-16: 1000 mL via INTRAVENOUS

## 2021-05-16 MED ORDER — ACETAMINOPHEN 325 MG PO TABS
650.0000 mg | ORAL_TABLET | Freq: Four times a day (QID) | ORAL | Status: DC | PRN
  Administered 2021-05-17: 650 mg via ORAL
  Filled 2021-05-16: qty 2

## 2021-05-16 MED ORDER — MIDAZOLAM HCL 2 MG/ML PO SYRP: 8.0000 mg | ORAL_SOLUTION | Freq: Once | ORAL | Status: DC | PRN

## 2021-05-16 MED ORDER — HYDROMORPHONE HCL 1 MG/ML IJ SOLN: 1.0000 mg | Freq: Once | INTRAMUSCULAR | Status: DC | PRN

## 2021-05-16 MED ORDER — MIDODRINE HCL 5 MG PO TABS
10.0000 mg | ORAL_TABLET | Freq: Once | ORAL | Status: AC
  Administered 2021-05-16: 10 mg via ORAL
  Filled 2021-05-16: qty 2

## 2021-05-16 MED ORDER — METHYLPREDNISOLONE SODIUM SUCC 125 MG IJ SOLR
125.0000 mg | Freq: Once | INTRAMUSCULAR | Status: DC | PRN
Start: 2021-05-16 — End: 2021-05-16

## 2021-05-16 MED ORDER — CHLORHEXIDINE GLUCONATE CLOTH 2 % EX PADS
6.0000 | MEDICATED_PAD | Freq: Every day | CUTANEOUS | Status: DC
  Administered 2021-05-17 – 2021-05-18 (×2): 6 via TOPICAL

## 2021-05-16 MED ORDER — INSULIN ASPART 100 UNIT/ML IJ SOLN
0.0000 [IU] | Freq: Three times a day (TID) | INTRAMUSCULAR | Status: DC
  Administered 2021-05-16: 5 [IU] via SUBCUTANEOUS
  Administered 2021-05-17 (×2): 3 [IU] via SUBCUTANEOUS
  Administered 2021-05-17 – 2021-05-18 (×2): 5 [IU] via SUBCUTANEOUS
  Administered 2021-05-18: 3 [IU] via SUBCUTANEOUS
  Filled 2021-05-16 (×6): qty 1

## 2021-05-16 MED ORDER — LEVOFLOXACIN IN D5W 750 MG/150ML IV SOLN
750.0000 mg | Freq: Once | INTRAVENOUS | Status: AC
  Administered 2021-05-16: 750 mg via INTRAVENOUS
  Filled 2021-05-16: qty 150

## 2021-05-16 MED ORDER — SODIUM CHLORIDE 0.9 % IV SOLN: INTRAVENOUS | Status: AC

## 2021-05-16 MED ORDER — ONDANSETRON HCL 4 MG PO TABS: 4.0000 mg | ORAL_TABLET | Freq: Four times a day (QID) | ORAL | Status: DC | PRN

## 2021-05-16 MED ORDER — SODIUM CHLORIDE 0.9% FLUSH
3.0000 mL | Freq: Two times a day (BID) | INTRAVENOUS | Status: DC
  Administered 2021-05-17 – 2021-05-18 (×3): 3 mL via INTRAVENOUS

## 2021-05-16 MED ORDER — MIDAZOLAM HCL 2 MG/2ML IJ SOLN
INTRAMUSCULAR | Status: DC | PRN
Start: 2021-05-16 — End: 2021-05-16
  Administered 2021-05-16: 2 mg via INTRAVENOUS
  Administered 2021-05-16: 1 mg via INTRAVENOUS

## 2021-05-16 MED ORDER — FENTANYL CITRATE (PF) 100 MCG/2ML IJ SOLN
INTRAMUSCULAR | Status: AC
  Filled 2021-05-16: qty 2

## 2021-05-16 MED ORDER — MIDAZOLAM HCL 5 MG/5ML IJ SOLN
INTRAMUSCULAR | Status: AC
Start: 2021-05-16 — End: 2021-05-17
  Filled 2021-05-16: qty 5

## 2021-05-16 MED ORDER — FENTANYL CITRATE (PF) 100 MCG/2ML IJ SOLN
INTRAMUSCULAR | Status: DC | PRN
  Administered 2021-05-16: 50 ug via INTRAVENOUS

## 2021-05-16 MED ORDER — HEPARIN SODIUM (PORCINE) 1000 UNIT/ML IJ SOLN
INTRAMUSCULAR | Status: AC
  Filled 2021-05-16: qty 10

## 2021-05-16 MED ORDER — SODIUM CHLORIDE 0.9 % IV SOLN: 250.0000 mL | INTRAVENOUS | Status: DC | PRN

## 2021-05-16 MED ORDER — ACETAMINOPHEN 325 MG PO TABS
650.0000 mg | ORAL_TABLET | ORAL | Status: DC | PRN
  Administered 2021-05-17: 650 mg via ORAL
  Filled 2021-05-16: qty 2

## 2021-05-16 MED ORDER — SIMVASTATIN 20 MG PO TABS
40.0000 mg | ORAL_TABLET | Freq: Every day | ORAL | Status: DC
  Administered 2021-05-16 – 2021-05-18 (×3): 40 mg via ORAL
  Filled 2021-05-16: qty 2
  Filled 2021-05-16: qty 4
  Filled 2021-05-16: qty 2

## 2021-05-16 MED ORDER — IODIXANOL 320 MG/ML IV SOLN
INTRAVENOUS | Status: DC | PRN
  Administered 2021-05-16: 40 mL via INTRA_ARTERIAL

## 2021-05-16 MED ORDER — ONDANSETRON HCL 4 MG/2ML IJ SOLN: 4.0000 mg | Freq: Four times a day (QID) | INTRAMUSCULAR | Status: DC | PRN

## 2021-05-16 MED ORDER — PANTOPRAZOLE SODIUM 20 MG PO TBEC
20.0000 mg | DELAYED_RELEASE_TABLET | Freq: Every day | ORAL | Status: DC
  Administered 2021-05-16 – 2021-05-18 (×3): 20 mg via ORAL
  Filled 2021-05-16 (×3): qty 1

## 2021-05-16 MED ORDER — LACTATED RINGERS IV BOLUS
1000.0000 mL | Freq: Once | INTRAVENOUS | Status: AC
  Administered 2021-05-16: 1000 mL via INTRAVENOUS

## 2021-05-16 MED ORDER — IOHEXOL 350 MG/ML SOLN
100.0000 mL | Freq: Once | INTRAVENOUS | Status: AC | PRN
  Administered 2021-05-16: 100 mL via INTRAVENOUS

## 2021-05-16 MED ORDER — MORPHINE SULFATE (PF) 4 MG/ML IV SOLN: 2.0000 mg | INTRAVENOUS | Status: DC | PRN

## 2021-05-16 MED ORDER — SODIUM CHLORIDE 0.9% FLUSH: 3.0000 mL | INTRAVENOUS | Status: DC | PRN

## 2021-05-16 MED ORDER — SODIUM CHLORIDE 0.9 % IV SOLN: INTRAVENOUS | Status: DC

## 2021-05-16 MED ORDER — ACETAMINOPHEN 650 MG RE SUPP
650.0000 mg | Freq: Four times a day (QID) | RECTAL | Status: DC | PRN
Start: 2021-05-16 — End: 2021-05-18

## 2021-05-16 MED ORDER — FAMOTIDINE 20 MG PO TABS: 40.0000 mg | ORAL_TABLET | Freq: Once | ORAL | Status: DC | PRN

## 2021-05-16 SURGICAL SUPPLY — 15 items
BLOCK BEAD 500-700 (Vascular Products) ×1 IMPLANT
CANNULA 5F STIFF (CANNULA) ×1 IMPLANT
CATH ANGIO 5F PIGTAIL 65CM (CATHETERS) ×1 IMPLANT
CATH MICROCATH PRGRT 2.8F 110 (CATHETERS) IMPLANT
CATH VS15FR (CATHETERS) ×1 IMPLANT
COVER PROBE U/S 5X48 (MISCELLANEOUS) ×1 IMPLANT
DEVICE STARCLOSE SE CLOSURE (Vascular Products) ×1 IMPLANT
DEVICE TORQUE .025-.038 (MISCELLANEOUS) ×1 IMPLANT
GLIDEWIRE STIFF .35X180X3 HYDR (WIRE) ×1 IMPLANT
MICROCATH PROGREAT 2.8F 110 CM (CATHETERS) ×2
PACK ANGIOGRAPHY (CUSTOM PROCEDURE TRAY) ×2 IMPLANT
SHEATH BRITE TIP 5FRX11 (SHEATH) ×1 IMPLANT
SYR MEDRAD MARK 7 150ML (SYRINGE) ×1 IMPLANT
TUBING CONTRAST HIGH PRESS 72 (TUBING) ×1 IMPLANT
WIRE GUIDERIGHT .035X150 (WIRE) ×1 IMPLANT

## 2021-05-16 NOTE — Progress Notes (Addendum)
Notified by RN about a drop in patient's blood pressure to 76/57.  He continues to have episodes of rectal bleeding.  Per RN patient asymptomatic.  We will give 1 L fluid bolus.  Recheck H&H and transfuse as needed ?We will order an urgent CT angiogram GI Bleed. ?

## 2021-05-16 NOTE — Assessment & Plan Note (Addendum)
Patient with a known history of diverticulosis who presents to the ER for evaluation of several episodes of painless rectal bleeding. ?We will monitor serial H&H and transfuse for hemoglobin less than 7g/dl ?Will request GI consult ?Clear liquid diet for now ? ? ?

## 2021-05-16 NOTE — Op Note (Signed)
[facility] ? Percutaneous Study/Intervention Procedural Note ? ? ? ? ?Surgeon(s): [employer]  ? ?Assistants: none ? ?Pre-operative Diagnosis: 1. Lower GI bleed ?2.  Diverticulosis ? ?Post-operative diagnosis:  Same ? ?Procedure(s) Performed: ?            1.  Ultrasound guidance for vascular access right femoral artery ?            2.  Catheter placement into the inferior mesenteric artery and then to sigmoid artery branches and the superior rectal artery ?            3.  Aortogram and selective angiogram of the sigmoid artery branches and superior rectal ?            4.  Microbead embolization of 2 branches of the sigmoid arteries as well as the superior rectal with a total of 2 cc of 500-700 ? polyvinyl alcohol beads. ?            5.  StarClose closure device right femoral artery ? ?Anesthesia: Continuous ECG pulse oximetry and cardiopulmonary monitoring was performed throughout the entire procedure by the interventional radiology nurse total sedation time was 23 minutes.  Parenteral Versed and fentanyl were administered. ? ?EBL: 5 cc ? ?Fluoro Time: 5.5 minutes ? ?Contrast: 40 cc ?             ?Indications:  Patient is a 56 y.o.male with brisk lower GI bleeding with resultant anemia. The patient has a CT angiogram showing a mid sigmoid bleed. The patient is brought in for angiography for further evaluation and potential treatment. Risks and benefits are discussed and informed consent is obtained ? ?Procedure:  The patient was identified and appropriate procedural time out was performed.  The patient was then placed supine on the table and prepped and draped in the usual sterile fashion. Moderate conscious sedation was administered during a face to face encounter with the patient throughout the procedure with my supervision of the RN administering medicines and monitoring the patient's vital signs, pulse oximetry, telemetry and mental status throughout from the start of the procedure  until the patient was taken to the recovery room.  Ultrasound was used to evaluate the right common femoral artery.  It was patent .  A digital ultrasound image was acquired.  A micropuncture needle was used to access the right common femoral artery under direct ultrasound guidance and a permanent image was performed.  A microwire was then advanced without difficulty under fluoroscopic guidance followed by a microsheath.  A 0.035 J wire was advanced without resistance and a 5Fr sheath was placed.  Pigtail catheter was placed into the infrarenal aorta and an RAO aortogram was performed. This demonstrated the origin of the IMA.  A V S1 catheter was used to selectively cannulate the IMA and hand-injection contrast was used to demonstrate the proximal arterial system.  This demonstrated the left colic which coursed toward the splenic flexure and did not appear to be involved in the bleed.  2 sigmoidal artery branches were present that directly fed toward the mid sigmoid colon a large branch off the superior rectal also fed the mid and more distal sigmoid colon.  Faint blush was noted in the mid to distal area I elected to cannulate these 3 individual branches beginning with the superior branch using a prograde catheter. I initially advanced the Pro-Great microcatheter out the superior sigmoidal artery and instilled approximately 1/2 cc of 500 700 ?m beads in this  location. Angiogram following this showed the main vessels to be open with less brisk filling. I then pulled back and cannulated the second sigmoid artery and instilled three quarters of a cc of 500 to 700 ?m beads.  Follow-up angiogram again demonstrated patency but diminished filling.  The superior rectal artery was cannulated again with a large branch that coursed toward the mid to distal sigmoid this was in the area of the previously noted blush and I instilled two thirds of a cc of 500 700 ?m beads. Selective imaging was performed which showed these  arteries all remain patent but there was now significant decrease in the filling of the distal branches.  A total of 2 cc of 500 700 polyvinyl alcohol beads were deployed. Again, completion angiogram showed the main vessels to be open with less brisk filling. I elected to terminate the procedure. The diagnostic catheter was removed. StarClose closure device was deployed in usual fashion with excellent hemostatic result. The patient was taken to the recovery room in stable condition having tolerated the procedure well. ? ? ? ? ?Findings:   findings are as noted above.  On initial injection after selection of the inferior mesenteric artery with the V S1 catheter there is a faint blush noted in the mid to distal sigmoid distribution.  I selected the 3 arteries as noted above with a prograde catheter and each I instilled the medium sized beads.  Follow-up imaging from the VS 1 catheter demonstrated successful embolization no longer imaged any blush but there was still filling of the large vessels. ? ?Disposition: Patient was taken to the recovery room in stable condition having tolerated the procedure well. ? ?Complications:  None ? ?Hortencia Pilar ?05/16/2021 ?6:08 PM ? ? ?This note was created with Dragon Medical transcription system. Any errors in dictation are purely unintentional.  ?

## 2021-05-16 NOTE — Progress Notes (Signed)
Inpatient Diabetes Program Recommendations ? ?AACE/ADA: New Consensus Statement on Inpatient Glycemic Control (2015) ? ?Target Ranges:  Prepandial:   less than 140 mg/dL ?     Peak postprandial:   less than 180 mg/dL (1-2 hours) ?     Critically ill patients:  140 - 180 mg/dL  ? ?Lab Results  ?Component Value Date  ? GLUCAP 237 (H) 05/16/2021  ? HGBA1C 9.0 (H) 05/14/2021  ? ? ?Review of Glycemic Control ? Latest Reference Range & Units 05/16/21 09:31 05/16/21 11:20  ?Glucose-Capillary 70 - 99 mg/dL 241 (H) 237 (H)  ? ?Diabetes history: DM 2 ?Outpatient Diabetes medications:  ?Ozempic 1 mg weekly ?Metformin 500 mg tid with meals ?Current orders for Inpatient glycemic control:  ?Novolog moderate tid with meals ?Inpatient Diabetes Program Recommendations:   ?If appropriate, consider adding Semglee 10 units daily. ? ?Thanks,  ?Adah Perl, RN, BC-ADM ?Inpatient Diabetes Coordinator ?Pager [phone]  (8a-5p) ? ? ?

## 2021-05-16 NOTE — Progress Notes (Signed)
CT angio GI bleed resulted and shows  evidence of an active bleeding site in the mid sigmoid ?colon which appears to be mainly associated with a diverticulum. ?Vascular surgery has been consulted ?We will continue to monitor H&H and transfuse as needed ?We will transfer patient to stepdown for closer monitoring ?

## 2021-05-16 NOTE — ED Provider Notes (Signed)
? ?[facility] ?Provider Note ? ? ? Event Date/Time  ? First MD Initiated Contact with Patient 05/16/21 [phone]   ?  (approximate) ? ? ?History  ? ?No chief complaint on file. ? ? ?HPI ? ?Matthew Collier is a 56 y.o. male who presents to the ED for evaluation of No chief complaint on file. ?  ?I review PCP visit from 4/17.  History of DM, HLD, HTN and obesity. ?I reviewed colonoscopy from 2019.  13 polyps removed, diverticulosis. ? ?Patient presents to the ED, accompanied by his wife, for evaluation of at least 6 episodes of frank bloody stool in the past few hours.  Starting around 9 PM, he reports multiple episodes at home as well as multiple here in the waiting room before coming back.  Denies any abdominal pain.  Reports intermittent sensation of feeling flushed which is new for him.  Denies any dizziness or falls.  Denies any other blood such as hematemesis, hematuria or epistaxis. ? ?Physical Exam  ? ?Triage Vital Signs: ?ED Triage Vitals  ?Enc Vitals Group  ?   BP 05/16/21 0118 (!) 156/92  ?   Pulse Rate 05/16/21 0118 (!) 102  ?   Resp 05/16/21 0118 18  ?   Temp 05/16/21 0118 98.8 ?F (37.1 ?C)  ?   Temp Source 05/16/21 0118 Oral  ?   SpO2 05/16/21 0118 96 %  ?   Weight 05/16/21 0126 220 lb (99.8 kg)  ?   Height 05/16/21 0126 '5\' 10"'$  (1.778 m)  ?   Head Circumference --   ?   Peak Flow --   ?   Pain Score --   ?   Pain Loc --   ?   Pain Edu? --   ?   Excl. in [place]? --   ? ? ?Most recent vital signs: ?Vitals:  ? 05/16/21 0118 05/16/21 0400  ?BP: (!) 156/92 119/88  ?Pulse: (!) 102 94  ?Resp: 18 19  ?Temp: 98.8 ?F (37.1 ?C)   ?SpO2: 96% 94%  ? ? ?General: Awake, no distress.  ?CV:  Good peripheral perfusion.  ?Resp:  Normal effort.  ?Abd:  No distention. Soft and benign throughout.  Chaperoned rectal exam without active bleeding.  No anal fissures or external hemorrhoids ?MSK:  No deformity noted.  ?Neuro:  No focal deficits appreciated. ?Other:   ? ? ?ED Results / Procedures / Treatments   ? ?Labs ?(all labs ordered are listed, but only abnormal results are displayed) ?Labs Reviewed  ?COMPREHENSIVE METABOLIC PANEL - Abnormal; Notable for the following components:  ?    Result Value  ? Glucose, Bld 197 (*)   ? All other components within normal limits  ?URINALYSIS, ROUTINE W REFLEX MICROSCOPIC - Abnormal; Notable for the following components:  ? Color, Urine YELLOW (*)   ? APPearance CLEAR (*)   ? All other components within normal limits  ?HEMOGLOBIN AND HEMATOCRIT, BLOOD - Abnormal; Notable for the following components:  ? HCT 37.6 (*)   ? All other components within normal limits  ?CBC WITH DIFFERENTIAL/PLATELET  ?LIPASE, BLOOD  ?TYPE AND SCREEN  ? ? ?EKG ?Sinus rhythm, rate of 94 bpm.  Normal axis and intervals.  No clear evidence of acute ischemia.  Poor quality EKG. ? ?RADIOLOGY ? ? ?Official radiology report(s): ?No results found. ? ?PROCEDURES and INTERVENTIONS: ? ?.1-3 Lead EKG Interpretation ?Performed by: Vladimir Crofts, MD ?Authorized by: Vladimir Crofts, MD  ? ?  Interpretation: normal   ?  ECG rate:  92 ?  ECG rate assessment: normal   ?  Rhythm: sinus rhythm   ?  Ectopy: none   ?  Conduction: normal   ? ?Medications - No data to display ? ? ?IMPRESSION / MDM / ASSESSMENT AND PLAN / ED COURSE  ?I reviewed the triage vital signs and the nursing notes. ? ?56 year old male presents to the ED with multiple episodes of painless bloody stool this evening requiring admission medically.  He is tachycardic on arrival but remains hemodynamically stable throughout.  Benign frontal abdomen and exam overall.  He looks well to me.  Blood work with normal hemoglobin on first check in triage.  Metabolic panel with no acute derangements.  Repeat H&H after she is roomed does demonstrate a nearly two-point drop without any further episodes of bloody stool once he is roomed.  We will consult with medicine for admission and get him in for repeat colonoscopy.  No abdominal pain and a benign exam makes  diverticulitis less likely.  Less likely internal or external hemorrhoids. ? ?  ? ? ?FINAL CLINICAL IMPRESSION(S) / ED DIAGNOSES  ? ?Final diagnoses:  ?Lower GI bleed  ? ? ? ?Rx / DC Orders  ? ?ED Discharge Orders   ? ? None  ? ?  ? ? ? ?Note:  This document was prepared using Dragon voice recognition software and may include unintentional dictation errors. ?  ?Vladimir Crofts, MD ?05/16/21 [phone] ? ?

## 2021-05-16 NOTE — Assessment & Plan Note (Signed)
Hold benazepril for now due to relative hypotension ?

## 2021-05-16 NOTE — Consult Note (Signed)
[facility] ?Vascular Consult Note ? ?MRN : [id number] ? ?CHETT TANIGUCHI is a 56 y.o. (1965/05/13) male who presents with chief complaint of No chief complaint on file. ?. ? ?History of Present Illness:  ? ?I am asked to see the patient by Dr. Francine Graven. ? ?The patient is a 56 year old gentleman admitted to [facility] earlier today with blood per rectum.  Since admission he has now had multiple further bowel movements.  This is painless bleeding however he is hemodynamically now tachycardic. ? ?CT scan was performed which demonstrates probable source is the sigmoid secondary to diverticulosis. ? ?Current Facility-Administered Medications  ?Medication Dose Route Frequency Provider Last Rate Last Admin  ? [MAR Hold] acetaminophen (TYLENOL) tablet 650 mg  650 mg Oral Q6H PRN Agbata, Tochukwu, MD      ? Or  ? [MAR Hold] acetaminophen (TYLENOL) suppository 650 mg  650 mg Rectal Q6H PRN Agbata, Tochukwu, MD      ? [MAR Hold] Chlorhexidine Gluconate Cloth 2 % PADS 6 each  6 each Topical Q0600 Agbata, Tochukwu, MD      ? [MAR Hold] insulin aspart (novoLOG) injection 0-15 Units  0-15 Units Subcutaneous TID WC Agbata, Tochukwu, MD   5 Units at 05/16/21 1127  ? levofloxacin (LEVAQUIN) IVPB 750 mg  750 mg Intravenous Q24H Azariah Latendresse, Dolores Lory, MD      ? Doug Sou Hold] ondansetron (ZOFRAN) tablet 4 mg  4 mg Oral Q6H PRN Agbata, Tochukwu, MD      ? Or  ? [MAR Hold] ondansetron (ZOFRAN) injection 4 mg  4 mg Intravenous Q6H PRN Agbata, Tochukwu, MD      ? [MAR Hold] pantoprazole (PROTONIX) EC tablet 20 mg  20 mg Oral Daily Agbata, Tochukwu, MD   20 mg at 05/16/21 1002  ? [MAR Hold] simvastatin (ZOCOR) tablet 40 mg  40 mg Oral Daily Agbata, Tochukwu, MD   40 mg at 05/16/21 1002  ? ? ?Past Medical History:  ?Diagnosis Date  ? Diabetes mellitus without complication ([place])   ? Hyperlipidemia   ? Hypertension   ? Lab test positive for detection of COVID-19 virus 02/16/2020  ? ? ?Past Surgical History:   ?Procedure Laterality Date  ? COLONOSCOPY WITH PROPOFOL N/A 10/03/2017  ? Procedure: COLONOSCOPY WITH PROPOFOL;  Surgeon: Virgel Manifold, MD;  Location: [facility] ENDOSCOPY;  Service: Endoscopy;  Laterality: N/A;  ? ESOPHAGOGASTRODUODENOSCOPY (EGD) WITH PROPOFOL N/A 10/03/2017  ? Procedure: ESOPHAGOGASTRODUODENOSCOPY (EGD) WITH PROPOFOL;  Surgeon: Virgel Manifold, MD;  Location: [facility] ENDOSCOPY;  Service: Endoscopy;  Laterality: N/A;  ? PLEURAL SCARIFICATION    ? ? ?Social History ?Social History  ? ?Tobacco Use  ? Smoking status: Never  ? Smokeless tobacco: Never  ?Vaping Use  ? Vaping Use: Never used  ?Substance Use Topics  ? Alcohol use: Yes  ?  Comment: occasional  ? Drug use: No  ? ? ?Family History ?Family History  ?Problem Relation Age of Onset  ? Hypertension Mother   ? Diabetes Mother   ? Hypertension Father   ? Cancer Father   ? ? ?Allergies  ?Allergen Reactions  ? Penicillins   ? ? ? ?REVIEW OF SYSTEMS (Negative unless checked) ? ?Constitutional: '[]'$ Weight loss  '[]'$ Fever  '[]'$ Chills ?Cardiac: '[]'$ Chest pain   '[]'$ Chest pressure   '[]'$ Palpitations   '[]'$ Shortness of breath when laying flat   '[]'$ Shortness of breath at rest   '[]'$ Shortness of breath with exertion. ?Vascular:  '[]'$ Pain in legs with walking   '[]'$ Pain  in legs at rest   '[]'$ Pain in legs when laying flat   '[]'$ Claudication   '[]'$ Pain in feet when walking  '[]'$ Pain in feet at rest  '[]'$ Pain in feet when laying flat   '[]'$ History of DVT   '[]'$ Phlebitis   '[]'$ Swelling in legs   '[]'$ Varicose veins   '[]'$ Non-healing ulcers ?Pulmonary:   '[]'$ Uses home oxygen   '[]'$ Productive cough   '[]'$ Hemoptysis   '[]'$ Wheeze  '[]'$ COPD   '[]'$ Asthma ?Neurologic:  '[]'$ Dizziness  '[]'$ Blackouts   '[]'$ Seizures   '[]'$ History of stroke   '[]'$ History of TIA  '[]'$ Aphasia   '[]'$ Temporary blindness   '[]'$ Dysphagia   '[]'$ Weakness or numbness in arms   '[]'$ Weakness or numbness in legs ?Musculoskeletal:  '[]'$ Arthritis   '[]'$ Joint swelling   '[]'$ Joint pain   '[]'$ Low back pain ?Hematologic:  '[]'$ Easy bruising  '[]'$ Easy bleeding   '[]'$ Hypercoagulable state    '[]'$ Anemic  '[]'$ Hepatitis ?Gastrointestinal:  '[]'$ Blood in stool   '[]'$ Vomiting blood  '[]'$ Gastroesophageal reflux/heartburn   '[]'$ Difficulty swallowing. ?Genitourinary:  '[]'$ Chronic kidney disease   '[]'$ Difficult urination  '[]'$ Frequent urination  '[]'$ Burning with urination   '[]'$ Blood in urine ?Skin:  '[]'$ Rashes   '[]'$ Ulcers   '[]'$ Wounds ?Psychological:  '[]'$ History of anxiety   '[]'$  History of major depression. ? ? ? ?Physical Examination ? ?Vitals:  ? 05/16/21 1315 05/16/21 1425 05/16/21 1555 05/16/21 1651  ?BP:  108/76 129/89 (!) 145/97  ?Pulse: 99 90 82 87  ?Resp: (!) '23 20 14 15  '$ ?Temp:  98.1 ?F (36.7 ?C) 98 ?F (36.7 ?C) 98.3 ?F (36.8 ?C)  ?TempSrc:   Oral Oral  ?SpO2: 99% 98% 98% 100%  ?Weight:   99.1 kg 99.8 kg  ?Height:   '5\' 10"'$  (1.778 m) '5\' 10"'$  (1.778 m)  ? ?Body mass index is 31.57 kg/m?. ? ?Head: Keys/AT, No temporalis wasting. Prominent temp pulse not noted. ?Ear/Nose/Throat: Nares w/o erythema or drainage, oropharynx w/o obsrtuction, Mallampati score: 3.    ?Eyes: PERRLA, Sclera nonicteric.  ?Neck: Supple, no nuchal rigidity.  No bruit or JVD.  ?Pulmonary:  Breath sounds equal bilaterally, no use of accessory muscles.  ?Cardiac: RRR, normal S1, S2, no Murmurs, rubs or gallops. ?Vascular: 2+ radial pulses ?Gastrointestinal: soft, non-tender, non-distended.  ?Musculoskeletal: Moves all extremities.  No deformity or atrophy. No edema. ?Neurologic: CN 2-12 intact. Symmetrical.  Speech is fluent.  ?Psychiatric: Judgment intact, Mood & affect appropriate for pt's clinical situation. ?Dermatologic: No rashes or ulcers noted.  No cellulitis or open wounds. ?Lymph : No Cervical,  or Inguinal lymphadenopathy. ? ? ? ?  ?CBC ?Lab Results  ?Component Value Date  ? WBC 6.7 05/16/2021  ? HGB 11.5 (L) 05/16/2021  ? HCT 33.2 (L) 05/16/2021  ? MCV 91.2 05/16/2021  ? PLT 206 05/16/2021  ? ? ?BMET ?   ?Component Value Date/Time  ? NA 138 05/16/2021 0118  ? NA 139 05/14/2021 0948  ? K 4.1 05/16/2021 0118  ? CL 107 05/16/2021 0118  ? CO2 24 05/16/2021 0118   ? GLUCOSE 197 (H) 05/16/2021 0118  ? BUN 13 05/16/2021 0118  ? BUN 10 05/14/2021 0948  ? CREATININE 0.70 05/16/2021 0118  ? CALCIUM 9.0 05/16/2021 0118  ? GFRNONAA >60 05/16/2021 0118  ? GFRAA 117 01/04/2020 0827  ? ?Estimated Creatinine Clearance: 119.2 mL/min (by C-G formula based on SCr of 0.7 mg/dL). ? ?COAG ?No results found for: INR, PROTIME ? ?Radiology ?CT angiography is reviewed by me and demonstrates GI bleed mid sigmoid with diverticulosis ? ?Assessment/Plan ?1.  Diverticulosis associated with diverticular bleed: ? ?Patient  should undergo angiography with the hope for embolization to slow down, hopefully stop his diverticular bleed.  Risks and benefits of been reviewed with the patient all questions been answered patient has agreed to proceed. ? ? ?Hortencia Pilar, MD ? ?05/16/2021 ?5:00 PM ? ? ?  ?

## 2021-05-16 NOTE — ED Notes (Signed)
Dr. Francine Graven notified of pt's BP. See mar. ?

## 2021-05-16 NOTE — Assessment & Plan Note (Signed)
Hold Metformin and semaglutide ?Place patient on a clear liquid diet ?Blood sugars before meals and at bedtime ?

## 2021-05-16 NOTE — H&P (Addendum)
?History and Physical  ? ? ?Patient: Matthew Collier:[id number] DOB: 27-Sep-1965 ?DOA: 05/16/2021 ?DOS: the patient was seen and examined on 05/16/2021 ?PCP: Charlynne Cousins, MD  ?Patient coming from: Home ? ?Chief Complaint: No chief complaint on file. ? ?HPI: Matthew Collier is a 56 y.o. male with medical history significant for diabetes mellitus, hypertension, dyslipidemia, diverticulosis who presents to the ER for evaluation of several episodes of frank bloody stool. ?Patient states that he was in his usual state of health until the evening when he had an episode of diarrhea.  He subsequently developed rectal bleeding and had several episodes of same.  He described passing bright red blood per rectum and denies having any abdominal pain.  He admits to not occasional NSAID use.  He denies having any hematemesis or passage of melena stools. ?He had a similar episode several years ago and had a colonoscopy which showed diverticulosis.  He had about 13 polyps removed. ?He denies having any chest pain, no shortness of breath but felt dizzy and lightheaded in the ER like he was going to pass out.  He was also diaphoretic ?He denies having any fever, no chills, no cough, no urinary symptoms, no leg swelling, no palpitations, no blurred vision or focal deficit. ?Review of Systems: As mentioned in the history of present illness. All other systems reviewed and are negative. ?Past Medical History:  ?Diagnosis Date  ? Diabetes mellitus without complication ([place])   ? Hyperlipidemia   ? Hypertension   ? Lab test positive for detection of COVID-19 virus 02/16/2020  ? ?Past Surgical History:  ?Procedure Laterality Date  ? COLONOSCOPY WITH PROPOFOL N/A 10/03/2017  ? Procedure: COLONOSCOPY WITH PROPOFOL;  Surgeon: Virgel Manifold, MD;  Location: [facility] ENDOSCOPY;  Service: Endoscopy;  Laterality: N/A;  ? ESOPHAGOGASTRODUODENOSCOPY (EGD) WITH PROPOFOL N/A 10/03/2017  ? Procedure: ESOPHAGOGASTRODUODENOSCOPY (EGD) WITH PROPOFOL;  Surgeon:  Virgel Manifold, MD;  Location: [facility] ENDOSCOPY;  Service: Endoscopy;  Laterality: N/A;  ? PLEURAL SCARIFICATION    ? ?Social History:  reports that he has never smoked. He has never used smokeless tobacco. He reports current alcohol use. He reports that he does not use drugs. ? ?Allergies  ?Allergen Reactions  ? Penicillins   ? ? ?Family History  ?Problem Relation Age of Onset  ? Hypertension Mother   ? Diabetes Mother   ? Hypertension Father   ? Cancer Father   ? ? ?Prior to Admission medications   ?Medication Sig Start Date End Date Taking? Authorizing Provider  ?benazepril (LOTENSIN) 40 MG tablet Take 1 tablet (40 mg total) by mouth daily. 04/03/20  Yes Cannady, Jolene T, NP  ?benzonatate (TESSALON) 100 MG capsule Take 1 capsule (100 mg total) by mouth 2 (two) times daily as needed for cough. 04/25/21  Yes Vigg, Avanti, MD  ?ferrous sulfate 325 (65 FE) MG tablet Take 1 tablet (325 mg total) by mouth 2 (two) times daily with a meal. 10/27/17  Yes Crissman, Jeannette How, MD  ?fexofenadine (ALLEGRA ALLERGY) 180 MG tablet Take 1 tablet (180 mg total) by mouth daily. 04/25/21  Yes Vigg, Avanti, MD  ?guaiFENesin-codeine (ROBITUSSIN AC) 100-10 MG/5ML syrup Take 5 mLs by mouth every evening. 04/25/21  Yes Vigg, Avanti, MD  ?metFORMIN (GLUCOPHAGE) 500 MG tablet Take 1 tablet (500 mg total) by mouth in the morning, at noon, and at bedtime. 04/23/21 05/23/21 Yes Vigg, Avanti, MD  ?ONE TOUCH ULTRA TEST test strip USE AS DIRECTED. NEED NEW INSURANCE. 05/03/15  Yes Golden Pop  A, MD  ?pantoprazole (PROTONIX) 20 MG tablet Take 1 tablet (20 mg total) by mouth daily. 04/03/20  Yes Venita Lick, NP  ?Semaglutide, 1 MG/DOSE, 4 MG/3ML SOPN Inject 1 mg as directed once a week. 04/23/21  Yes Vigg, Avanti, MD  ?simvastatin (ZOCOR) 40 MG tablet Take 1 tablet (40 mg total) by mouth daily. 04/23/21  Yes Vigg, Avanti, MD  ? ? ?Physical Exam: ?Vitals:  ? 05/16/21 0800 05/16/21 0815 05/16/21 0830 05/16/21 0845  ?BP:      ?Pulse: 84 (!) 102 91  87  ?Resp: 11 20 (!) 22 17  ?Temp:      ?TempSrc:      ?SpO2: 92% 97% 93% 96%  ?Weight:      ?Height:      ? ?Physical Exam ?Vitals and nursing note reviewed.  ?Constitutional:   ?   Appearance: Normal appearance.  ?HENT:  ?   Head: Normocephalic and atraumatic.  ?   Nose: Nose normal.  ?   Mouth/Throat:  ?   Mouth: Mucous membranes are moist.  ?Eyes:  ?   Pupils: Pupils are equal, round, and reactive to light.  ?Cardiovascular:  ?   Rate and Rhythm: Normal rate and regular rhythm.  ?Pulmonary:  ?   Effort: Pulmonary effort is normal.  ?   Breath sounds: Normal breath sounds.  ?Abdominal:  ?   General: Bowel sounds are normal.  ?   Palpations: Abdomen is soft.  ?   Comments: Central adiposity  ?Musculoskeletal:     ?   General: Normal range of motion.  ?   Cervical back: Normal range of motion and neck supple.  ?Skin: ?   General: Skin is warm and dry.  ?Neurological:  ?   General: No focal deficit present.  ?   Mental Status: He is alert and oriented to person, place, and time.  ?Psychiatric:     ?   Mood and Affect: Mood normal.     ?   Behavior: Behavior normal.  ? ? ?Data Reviewed: ?Relevant notes from primary care and specialist visits, past discharge summaries as available in EHR, including Care Everywhere. ?Prior diagnostic testing as pertinent to current admission diagnoses ?Updated medications and problem lists for reconciliation ?ED course, including vitals, labs, imaging, treatment and response to treatment ?Triage notes, nursing and pharmacy notes and ED provider's notes ?Notable results as noted in HPI ?Labs reviewed.  White count 6.7, hemoglobin 15.0 >> 13.2, hematocrit 42.6, MCV 91.2, RDW 11.7, platelet count 206, sodium 138, potassium 4.1, chloride 107, bicarb 24, glucose 197, BUN 13, creatinine 0.70, calcium 9.0, total protein 6.6, albumin 4.0, AST 22, ALT 28, alk phos 53 ?Twelve-lead EKG reviewed by me shows sinus rhythm ?There are no new results to review at this time. ? ?Assessment and Plan: ?*  GI bleeding ?Patient with a known history of diverticulosis who presents to the ER for evaluation of several episodes of painless rectal bleeding. ?We will monitor serial H&H and transfuse for hemoglobin less than 7g/dl ?Will request GI consult ?Clear liquid diet for now ? ? ? ?Diverticulosis of colon ?Treatment as outlined in 1 ? ?Diabetes mellitus ([place]) ?Hold Metformin and semaglutide ?Place patient on a clear liquid diet ?Blood sugars before meals and at bedtime ? ?Essential hypertension ?Hold benazepril for now due to relative hypotension ? ? ? ? ? Advance Care Planning:   Code Status: Full Code  ? ?Consults: Gastroenterology ? ?Family Communication: Greater than 50% of time was spent  discussing plan of care with patient at the bedside. He verbalizes understanding and agrees with the plan. ? ?Severity of Illness: ?The appropriate patient status for this patient is OBSERVATION. Observation status is judged to be reasonable and necessary in order to provide the required intensity of service to ensure the patient's safety. The patient's presenting symptoms, physical exam findings, and initial radiographic and laboratory data in the context of their medical condition is felt to place them at decreased risk for further clinical deterioration. Furthermore, it is anticipated that the patient will be medically stable for discharge from the hospital within 2 midnights of admission.  ? ?Author: ?[name], MD ?05/16/2021 8:51 AM ? ?For on call review [email or web address].  ?

## 2021-05-16 NOTE — ED Notes (Signed)
Pt. Ate entire clear liquid lunch. ?

## 2021-05-16 NOTE — ED Triage Notes (Signed)
56 y/o male arrived to the [facility] with a CC of rectal bleeding. Pt states he has a history of GI bleeding 4 years ago. Pt notes abd pain in the center of stomach. Pt notes bright red blood. Pt notes diarrhea along with rectal bleeding. Pt denies being on any blood thinners. Pt denies lightheadedness or feelings of passing out.  ?

## 2021-05-16 NOTE — ED Notes (Addendum)
Patient diaphoretic at this time c/o weakness and feeling like he could pass out, VSS,  and afebrile at this time, MD notified ?

## 2021-05-16 NOTE — Assessment & Plan Note (Signed)
Treatment as outlined in 1 

## 2021-05-16 NOTE — Consult Note (Signed)
? ? ?GI Inpatient Consult Note ? ?Reason for Consult: Hematochezia  ?  ?Attending Requesting Consult: Dr. Royce Macadamia Collier ? ?History of Present Illness: ?Matthew Matthew is a 56 y.o. male seen for evaluation of painless hematochezia at the request of admitting hospitalist - Dr. Collier Collier. Patient has a PMH of HTN, HLD, obesity, Barrett's esophagus without dysplasia, and T2DM. He presented to the [facility] ED overnight for chief complaint of multiple episodes of painless hematochezia. He reports he felt in normal state of health yesterday when he woke up and throughout the day. He had dinner last night around 6 PM and reports he had a loose brown BM without any frank hematochezia around 7 PM. He reports around 9 PM he had first episode of painless hematochezia where he passed bright red blood in toilet bowl. He felt very flushed and felt like he was getting dizzy. He had two additional episodes in the next two hours and decided to come to the ED. He arrived to the ED around 1:30 AM last night and reports had three additional episodes of hematochezia in the ED waiting area. Upon presentation to the ED, he was tachycardic (102), hypertensive (156/92), and otherwise normal vital signs. Hemoglobin upon ED check in was within normal limits at 15 and upon evaluation around 0400 was 13.2. He was admitted to the unit. GI consulted for further evaluation and management.  ? ?Patient seen and evaluated in the ED room this afternoon. He reports since being in the ED room he has had three additional episodes of hematochezia, last one being around 11:45 PM. He denies any abdominal pain or abdominal cramping. He denies any fevers, chills, nausea, vomiting, altered mental status, or melena. He has had some hypotension with drop in NP to 76/57 and was given 1L fluid bolus. Repeat hemoglobin 11.5 around 11:30 this morning. He reports a history of LGI bleeding presumed 2/2 diverticulosis. He was admitted August 2019 for hematochezia and  found to have diverticulitis managed conservatively with antibiotics. He had screening colonoscopy performed by Dr. Bonna Gains 09/2017 which showed sigmoid diverticulosis, medium-sized lipoma in ascending colon, three 4-6 mm TAs removed from cecum, five 3-6 mm polyps removed from descending colon with path showing multiple fragments of tubular adenomas and hyperplastic polyps, two 8-10 mm SSAs removed from sigmoid colon, three 4-5 mm polyps removed from recto-sigmoid colon with path hyperplastic polyps. EGD performed same time also showed Barrett's esophagus without dysplasia and mild chronic gastritis. He is not currently on any acid suppression medication because he ran out about a year ago. He denies any frequent NSAID use. He takes Ibuprofen occasionally for headaches. He just started Ozempic injections for diabetes about a month ago. His last injection was yesterday evening before symptoms started. CTA performed this afternoon showed active extravasation of contrast in mid sigmoid colon.  ? ? ?Past Medical History:  ?Past Medical History:  ?Diagnosis Date  ? Diabetes mellitus without complication ([place])   ? Hyperlipidemia   ? Hypertension   ? Lab test positive for detection of COVID-19 virus 02/16/2020  ?  ?Problem List: ?Patient Active Problem List  ? Diagnosis Date Noted  ? GI bleeding 05/16/2021  ? Diverticulosis of colon 05/16/2021  ? Diabetes mellitus ([place]) 05/16/2021  ? Upper respiratory tract infection 04/26/2021  ? B12 deficiency 04/04/2020  ? History of 2019 novel coronavirus disease (COVID-19) 02/21/2020  ? Poor compliance with medication 08/13/2019  ? Obese 03/29/2019  ? Iron deficiency 03/29/2019  ? Hyperlipidemia associated with type 2  diabetes mellitus ([place]) 03/26/2019  ? Barrett's esophagus without dysplasia   ? Ectopic gastric tissue   ? Special screening for malignant neoplasms, colon   ? Benign neoplasm of cecum   ? Benign neoplasm of transverse colon   ? Polyp of sigmoid colon   ? Lipoma of  intra-abdominal organs   ? Type 2 diabetes mellitus with obesity ([place])   ? Essential hypertension   ?  ?Past Surgical History: ?Past Surgical History:  ?Procedure Laterality Date  ? COLONOSCOPY WITH PROPOFOL N/A 10/03/2017  ? Procedure: COLONOSCOPY WITH PROPOFOL;  Surgeon: Virgel Manifold, MD;  Location: [facility] ENDOSCOPY;  Service: Endoscopy;  Laterality: N/A;  ? ESOPHAGOGASTRODUODENOSCOPY (EGD) WITH PROPOFOL N/A 10/03/2017  ? Procedure: ESOPHAGOGASTRODUODENOSCOPY (EGD) WITH PROPOFOL;  Surgeon: Virgel Manifold, MD;  Location: [facility] ENDOSCOPY;  Service: Endoscopy;  Laterality: N/A;  ? PLEURAL SCARIFICATION    ?  ?Allergies: ?Allergies  ?Allergen Reactions  ? Penicillins   ?  ?Home Medications: ?(Not in a hospital admission) ? ?Home medication reconciliation was completed with the patient.  ? ?Scheduled Inpatient Medications: ?  ? insulin aspart  0-15 Units Subcutaneous TID WC  ? pantoprazole  20 mg Oral Daily  ? simvastatin  40 mg Oral Daily  ? ? ?Continuous Inpatient Infusions: ?  ? ?PRN Inpatient Medications:  ?acetaminophen **OR** acetaminophen, ondansetron **OR** ondansetron (ZOFRAN) IV ? ?Family History: ?family history includes Cancer in his father; Diabetes in his mother; Hypertension in his father and mother.  The patient's family history is negative for inflammatory bowel disorders, GI malignancy, or solid organ transplantation. ? ?Social History:  ? reports that he has never smoked. He has never used smokeless tobacco. He reports current alcohol use. He reports that he does not use drugs. The patient denies ETOH, tobacco, or drug use.  ? ?Review of Systems: ?Constitutional: Weight is stable.  ?Eyes: No changes in vision. ?ENT: No oral lesions, sore throat.  ?GI: see HPI.  ?Heme/Lymph: No easy bruising.  ?CV: No chest pain.  ?GU: No hematuria.  ?Integumentary: No rashes.  ?Neuro: No headaches.  ?Psych: No depression/anxiety.  ?Endocrine: No heat/cold intolerance.  ?Allergic/Immunologic: No urticaria.   ?Resp: No cough, SOB.  ?Musculoskeletal: No joint swelling.  ?  ?Physical Examination: ?BP 94/72   Pulse 95   Temp 98.1 ?F (36.7 ?C) (Oral)   Resp (!) 22   Ht '5\' 10"'$  (1.778 m)   Wt 99.8 kg   SpO2 98%   BMI 31.57 kg/m?  ?Gen: NAD, alert and oriented x 4 ?HEENT: PEERLA, EOMI, ?Neck: supple, no JVD or thyromegaly ?Chest: CTA bilaterally, no wheezes, crackles, or other adventitious sounds ?CV: RRR, no m/g/c/r ?Abd: soft, NT, ND, +BS in all four quadrants; no HSM, guarding, ridigity, or rebound tenderness ?Ext: no edema, well perfused with 2+ pulses, ?Skin: no rash or lesions noted ?Lymph: no LAD ? ?Data: ?Lab Results  ?Component Value Date  ? WBC 6.7 05/16/2021  ? HGB 11.5 (L) 05/16/2021  ? HCT 33.2 (L) 05/16/2021  ? MCV 91.2 05/16/2021  ? PLT 206 05/16/2021  ? ?Recent Labs  ?Lab 05/16/21 ?0118 05/16/21 ?0407 05/16/21 ?1138  ?HGB 15.0 13.2 11.5*  ? ?Lab Results  ?Component Value Date  ? NA 138 05/16/2021  ? K 4.1 05/16/2021  ? CL 107 05/16/2021  ? CO2 24 05/16/2021  ? BUN 13 05/16/2021  ? CREATININE 0.70 05/16/2021  ? ?Lab Results  ?Component Value Date  ? ALT 28 05/16/2021  ? AST 22 05/16/2021  ? ALKPHOS  53 05/16/2021  ? BILITOT 0.5 05/16/2021  ? ?No results for input(s): APTT, INR, PTT in the last 168 hours. ? ?CTA 05/16/2021: ?IMPRESSION: ?1. Active extravasation of contrast in the mid sigmoid colon as ?detailed above likely accounting for the patient's bright red blood ?per rectum. ?2. Diffuse colonic diverticulosis without findings for acute ?diverticulitis. ?3. No other significant/acute abdominal/pelvic findings and no mass ?lesion or adenopathy. ?4. Stable 6 mm right lower lobe pulmonary nodule since 2019, ?considered benign. ? ?Assessment/Plan: ? ?56 y/o Caucasian male with a PMH of HTN, HLD, T2DM, and obesity presented to the [facility] ED overnight for 6 episodes of painless hematochezia and 3 additional episodes since admission. GI consulted for further evaluation and management.  ? ?Acute Lower GI bleeding  2/2 diverticular bleed ? ?Painless hematochezia - 2/2 #1 ? ?Acute blood loss anemia - 2/2 #1 ? ?Hypotension - in setting of hypovolemia 2/2 acute blood loss anemia  ? ?Sigmoid diverticulosis ? ?Barrett's esophag

## 2021-05-16 NOTE — ED Notes (Signed)
Pt in CT.

## 2021-05-17 ENCOUNTER — Encounter: Payer: Self-pay | Admitting: Vascular Surgery

## 2021-05-17 DIAGNOSIS — K5791 Diverticulosis of intestine, part unspecified, without perforation or abscess with bleeding: Secondary | ICD-10-CM | POA: Diagnosis not present

## 2021-05-17 LAB — GLUCOSE, CAPILLARY
Glucose-Capillary: 161 mg/dL — ABNORMAL HIGH (ref 70–99)
Glucose-Capillary: 154 mg/dL — ABNORMAL HIGH (ref 70–99)
Glucose-Capillary: 168 mg/dL — ABNORMAL HIGH (ref 70–99)
Glucose-Capillary: 212 mg/dL — ABNORMAL HIGH (ref 70–99)
Glucose-Capillary: 188 mg/dL — ABNORMAL HIGH (ref 70–99)

## 2021-05-17 LAB — HEMOGLOBIN AND HEMATOCRIT, BLOOD
HCT: 29 % — ABNORMAL LOW (ref 39.0–52.0)
HCT: 29.5 % — ABNORMAL LOW (ref 39.0–52.0)
HCT: 31.6 % — ABNORMAL LOW (ref 39.0–52.0)
Hemoglobin: 10 g/dL — ABNORMAL LOW (ref 13.0–17.0)
Hemoglobin: 10.3 g/dL — ABNORMAL LOW (ref 13.0–17.0)
Hemoglobin: 11.2 g/dL — ABNORMAL LOW (ref 13.0–17.0)

## 2021-05-17 NOTE — Plan of Care (Signed)
?  Problem: Clinical Measurements: ?Goal: Ability to maintain clinical measurements within normal limits will improve ?Outcome: Progressing ?  ?Problem: Clinical Measurements: ?Goal: Cardiovascular complication will be avoided ?Outcome: Progressing ?  ?Problem: Clinical Measurements: ?Goal: Respiratory complications will improve ?Outcome: Progressing ?  ?Problem: Clinical Measurements: ?Goal: Diagnostic test results will improve ?Outcome: Progressing ?  ?Problem: Elimination: ?Goal: Will not experience complications related to bowel motility ?Outcome: Progressing ?  ?Problem: Pain Managment: ?Goal: General experience of comfort will improve ?Outcome: Progressing ?  ?Problem: Skin Integrity: ?Goal: Risk for impaired skin integrity will decrease ?Outcome: Progressing ?  ?Problem: Safety: ?Goal: Ability to remain free from injury will improve ?Outcome: Progressing ?  ?

## 2021-05-17 NOTE — Progress Notes (Signed)
Report given to [employer]. Reports that room is not clean and they will call when room is ready. ?

## 2021-05-17 NOTE — Progress Notes (Signed)
[facility] Progress Note ? ?Patient: Matthew Collier    PHX:[id number]  DOA: 05/16/2021    ? ?Date of Service: the patient was seen and examined on 05/17/2021 ? ?No chief complaint on file. ? ?Brief hospital course: ?Matthew Collier is a 56 y.o. male with medical history significant for diabetes mellitus, hypertension, dyslipidemia, diverticulosis who presents to the ER for evaluation of several episodes of frank bloody stool. Patient states that he was in his usual state of health until the evening when he had an episode of diarrhea.  He subsequently developed rectal bleeding and had several episodes of same.  He described passing bright red blood per rectum and denies having any abdominal pain.  He admits to not occasional NSAID use.  He denies having any hematemesis or passage of melena stools. ?He had a similar episode several years ago and had a colonoscopy which showed diverticulosis.  He had about 13 polyps removed.  Patient was feeling dizzy and lightheaded in the ER like he was going to pass out.  He was also diaphoretic.  Denied any other symptoms ?GI and vascular surgery were consulted and patient was admitted for further management as below. ? ? ?Assessment and Plan: ?* GI bleeding ?Patient with a known history of diverticulosis who presents to the ER for evaluation of several episodes of painless rectal bleeding. ?We will monitor serial H&H and transfuse for hemoglobin less than 7g/dl ?GI was consulted, CTA reveals active bleeding in the sigmoid. ?Vascular surgery was consulted s/p visceral angiography with embolization done last night on 4/10 ?Patient was started on clinical diet, advance to full liquid today, advance diet as per tolerance, soft diet tomorrow a.m. ? ?  ?  ?Diverticulosis of colon ?Treatment as outlined in 1 ?  ?Diabetes mellitus ([place]) ?Hold Metformin and semaglutide ?Place patient on a clear liquid diet ?Blood sugars before meals and at bedtime ?  ?Essential hypertension ?Hold  benazepril for now due to relative hypotension ? ? ? ?Body mass index is 31.57 kg/m?.  ?Interventions: ?  ? ?   ?Diet: Clears to full liquid, will advance to soft diet tomorrow a.m. ?DVT Prophylaxis: SCD, pharmacological prophylaxis contraindicated due to lower GI bleeding   ? ?Advance goals of care discussion: Full code ? ?Family Communication: family was not present at bedside, at the time of interview.  ?The pt provided permission to discuss medical plan with the family. Opportunity was given to ask question and all questions were answered satisfactorily.  ? ?Disposition:  ?Pt is from Home, admitted with diverticular lower GI bleeding s/p angiogram and embolization, still remains at high risk for rebleeding, monitoring H&H and advancing diet today, which precludes a safe discharge. ?Discharge to home, when clinically stable, most likely tomorrow a.m. if H&H remained stable. ? ?Subjective: No significant events overnight, patient underwent angiography and embolization, tolerated procedure well, currently pain-free.  Patient tolerated clear liquid diet and would like to advance to full liquid today.  Denied any chest pain or palpitations, no shortness of breath ? ? ?Physical Exam: ?General:  alert oriented to time, place, and person.  ?Appear in no distress, affect appropriate ?Eyes: PERRLA ?ENT: Oral Mucosa Clear, moist  ?Neck: no JVD,  ?Cardiovascular: S1 and S2 Present, no Murmur,  ?Respiratory: good respiratory effort, Bilateral Air entry equal and Decreased, no Crackles, no wheezes ?Abdomen: Bowel Sound present, Soft and no tenderness,  ?Skin: no rashes ?Extremities: no Pedal edema, no calf tenderness ?Neurologic: without any new focal findings ?Gait not checked  due to patient safety concerns ? ?Vitals:  ? 05/17/21 0500 05/17/21 0600 05/17/21 0815 05/17/21 1200  ?BP: 109/73 102/67 129/77 126/80  ?Pulse: 83 84 77 79  ?Resp:      ?Temp:   97.7 ?F (36.5 ?C) 98 ?F (36.7 ?C)  ?TempSrc:      ?SpO2: 96% 96% 95% 97%   ?Weight:      ?Height:      ? ? ?Intake/Output Summary (Last 24 hours) at 05/17/2021 1450 ?Last data filed at 05/17/2021 1403 ?Gross per 24 hour  ?Intake 1731.67 ml  ?Output 3150 ml  ?Net -1418.33 ml  ? ?Filed Weights  ? 05/16/21 0126 05/16/21 1555 05/16/21 1651  ?Weight: 99.8 kg 99.1 kg 99.8 kg  ? ? ?Data Reviewed: ?I have personally reviewed and interpreted daily labs, tele strips, imagings as discussed above. ?I reviewed all nursing notes, pharmacy notes, vitals, pertinent old records ?I have discussed plan of care as described above with RN and patient/family. ? ?CBC: ?Recent Labs  ?Lab 05/16/21 ?0118 05/16/21 ?0407 05/16/21 ?1138 05/16/21 ?1904 05/17/21 ?0118 05/17/21 ?0938  ?WBC 6.7  --   --   --   --   --   ?NEUTROABS 3.6  --   --   --   --   --   ?HGB 15.0 13.2 11.5* 10.7* 10.0* 10.3*  ?HCT 42.6 37.6* 33.2* 30.9* 29.0* 29.5*  ?MCV 91.2  --   --   --   --   --   ?PLT 206  --   --   --   --   --   ? ?Basic Metabolic Panel: ?Recent Labs  ?Lab 05/14/21 ?5643 05/16/21 ?0118  ?NA 139 138  ?K 4.5 4.1  ?CL 100 107  ?CO2 17* 24  ?GLUCOSE 198* 197*  ?BUN 10 13  ?CREATININE 0.75* 0.70  ?CALCIUM 9.4 9.0  ? ? ?Studies: ?PERIPHERAL VASCULAR CATHETERIZATION ? ?Result Date: 05/16/2021 ?See surgical note for result.   ?Scheduled Meds: ? Chlorhexidine Gluconate Cloth  6 each Topical Q0600  ? insulin aspart  0-15 Units Subcutaneous TID WC  ? pantoprazole  20 mg Oral Daily  ? simvastatin  40 mg Oral Daily  ? sodium chloride flush  3 mL Intravenous Q12H  ? ?Continuous Infusions: ? sodium chloride 100 mL/hr at 05/17/21 0316  ? sodium chloride    ? ?PRN Meds: sodium chloride, acetaminophen **OR** acetaminophen, acetaminophen, HYDROmorphone (DILAUDID) injection, morphine injection, ondansetron **OR** ondansetron (ZOFRAN) IV, ondansetron (ZOFRAN) IV, ondansetron (ZOFRAN) IV, oxyCODONE, sodium chloride flush ? ?Time spent: 35 minutes ? ?Author: ?Val Riles MD ?[facility] ?05/17/2021 2:50 PM ? ?To reach On-call, see care teams  to locate the attending and reach out to them via [email or web address]. ?If 7PM-7AM, please contact night-coverage ?If you still have difficulty reaching the attending provider, please page the [facility] (Director on Call) for [facility] on amion for assistance. ? ?

## 2021-05-17 NOTE — Progress Notes (Signed)
Patient tx to room 239. RN into see patient.  ?

## 2021-05-18 LAB — PHOSPHORUS: Phosphorus: 5.1 mg/dL — ABNORMAL HIGH (ref 2.5–4.6)

## 2021-05-18 LAB — BASIC METABOLIC PANEL
Anion gap: 3 — ABNORMAL LOW (ref 5–15)
BUN: 10 mg/dL (ref 6–20)
CO2: 27 mmol/L (ref 22–32)
Calcium: 8.6 mg/dL — ABNORMAL LOW (ref 8.9–10.3)
Chloride: 110 mmol/L (ref 98–111)
Creatinine, Ser: 0.78 mg/dL (ref 0.61–1.24)
GFR, Estimated: >60 mL/min (ref >60)
Glucose, Bld: 181 mg/dL — ABNORMAL HIGH (ref 70–99)
Potassium: 3.8 mmol/L (ref 3.5–5.1)
Sodium: 140 mmol/L (ref 135–145)

## 2021-05-18 LAB — IRON AND TIBC
Iron: 125 ug/dL (ref 45–182)
Saturation Ratios: 28 % (ref 17.9–39.5)
TIBC: 448 ug/dL (ref 250–450)
UIBC: 323 ug/dL

## 2021-05-18 LAB — CBC
HCT: 27.4 % — ABNORMAL LOW (ref 39.0–52.0)
Hemoglobin: 9.7 g/dL — ABNORMAL LOW (ref 13.0–17.0)
MCH: 32.2 pg (ref 26.0–34.0)
MCHC: 35.4 g/dL (ref 30.0–36.0)
MCV: 91 fL (ref 80.0–100.0)
Platelets: 139 10*3/uL — ABNORMAL LOW (ref 150–400)
RBC: 3.01 MIL/uL — ABNORMAL LOW (ref 4.22–5.81)
RDW: 11.7 % (ref 11.5–15.5)
WBC: 4.9 10*3/uL (ref 4.0–10.5)
nRBC: 0 % (ref 0.0–0.2)

## 2021-05-18 LAB — HEMOGLOBIN AND HEMATOCRIT, BLOOD
HCT: 27.8 % — ABNORMAL LOW (ref 39.0–52.0)
Hemoglobin: 8.7 g/dL — ABNORMAL LOW (ref 13.0–17.0)

## 2021-05-18 LAB — VITAMIN B12: Vitamin B-12: 1678 pg/mL — ABNORMAL HIGH (ref 180–914)

## 2021-05-18 LAB — MAGNESIUM: Magnesium: 2 mg/dL (ref 1.7–2.4)

## 2021-05-18 LAB — FOLATE: Folate: 10.2 ng/mL (ref >5.9)

## 2021-05-18 LAB — GLUCOSE, CAPILLARY
Glucose-Capillary: 199 mg/dL — ABNORMAL HIGH (ref 70–99)
Glucose-Capillary: 222 mg/dL — ABNORMAL HIGH (ref 70–99)

## 2021-05-18 LAB — VITAMIN D 25 HYDROXY (VIT D DEFICIENCY, FRACTURES): Vit D, 25-Hydroxy: 14.09 ng/mL — ABNORMAL LOW (ref 30–100)

## 2021-05-18 NOTE — Progress Notes (Signed)
Matthew Collier to be D/C'd Home per MD order.  Discussed prescriptions and follow up appointments with the patient and husband. Prescriptions given to patient, medication list explained in detail. Pt verbalized understanding. ? ?Allergies as of 05/18/2021   ? ?   Reactions  ? Penicillins   ? ?  ? ?  ?Medication List  ?  ? ?TAKE these medications   ? ?benazepril 40 MG tablet ?Commonly known as: LOTENSIN ?Take 1 tablet (40 mg total) by mouth daily. ?  ?benzonatate 100 MG capsule ?Commonly known as: TESSALON ?Take 1 capsule (100 mg total) by mouth 2 (two) times daily as needed for cough. ?  ?ferrous sulfate 325 (65 FE) MG tablet ?Take 1 tablet (325 mg total) by mouth 2 (two) times daily with a meal. ?  ?fexofenadine 180 MG tablet ?Commonly known as: Allegra Allergy ?Take 1 tablet (180 mg total) by mouth daily. ?  ?guaiFENesin-codeine 100-10 MG/5ML syrup ?Commonly known as: ROBITUSSIN AC ?Take 5 mLs by mouth every evening. ?  ?metFORMIN 500 MG tablet ?Commonly known as: GLUCOPHAGE ?Take 1 tablet (500 mg total) by mouth in the morning, at noon, and at bedtime. ?  ?ONE TOUCH ULTRA TEST test strip ?Generic drug: glucose blood ?USE AS DIRECTED. NEED NEW INSURANCE. ?  ?pantoprazole 20 MG tablet ?Commonly known as: Protonix ?Take 1 tablet (20 mg total) by mouth daily. ?  ?Semaglutide (1 MG/DOSE) 4 MG/3ML Sopn ?Inject 1 mg as directed once a week. ?  ?simvastatin 40 MG tablet ?Commonly known as: ZOCOR ?Take 1 tablet (40 mg total) by mouth daily. ?  ? ?  ? ? ?Vitals:  ? 05/18/21 0738 05/18/21 1142  ?BP: (!) 142/89 126/90  ?Pulse: 77 77  ?Resp: 18 18  ?Temp: 97.9 ?F (36.6 ?C) 98.1 ?F (36.7 ?C)  ?SpO2: 98% 100%  ? ? ?Tele box removed and returned. Skin clean, dry and intact without evidence of skin break down, no evidence of skin tears noted. IV catheter discontinued intact. Site without signs and symptoms of complications. Dressing and pressure applied. Pt denies pain at this time. No complaints noted. ? ?An After Visit Summary was  printed and given to the patient. ?Patient escorted via [place], and D/C home via private auto. ? ?Rolley Sims  ?

## 2021-05-18 NOTE — Discharge Summary (Signed)
[facility] Discharge Summary ? ? ?Patient: Matthew Collier HYI:[id number]  PCP: Charlynne Cousins, MD  ?Date of admission: 05/16/2021   Date of discharge:  05/18/2021   ?  ?Discharge Diagnoses:  ?Principal Problem: ?  GI bleeding ?Active Problems: ?  Diverticulosis of colon ?  Diabetes mellitus ([place]) ?  Essential hypertension ?  GI bleed ?  History of GI diverticular bleed ? ? ?Admitted From: Home ?Disposition:  Home  ? ?Recommendations for Outpatient Follow-up:  ?PCP: in 1 wk ?F/u with vascular surgery prn ?Follow up LABS/TEST:  cbc after 1 wk ? ? ?Diet recommendation: Cardiac diet ? ?Activity: The patient is advised to gradually reintroduce usual activities, as tolerated ? ?Discharge Condition: stable ? ?Code Status: Full code  ? ?History of present illness: As per the H and P dictated on admission ?Hospital Course:  ?ISMAEEL ARVELO is a 56 y.o. male with medical history significant for diabetes mellitus, hypertension, dyslipidemia, diverticulosis who presents to the ER for evaluation of several episodes of frank bloody stool. Patient states that he was in his usual state of health until the evening when he had an episode of diarrhea.  He subsequently developed rectal bleeding and had several episodes of same.  He described passing bright red blood per rectum and denies having any abdominal pain.  He admits to not occasional NSAID use.  He denies having any hematemesis or passage of melena stools. ?He had a similar episode several years ago and had a colonoscopy which showed diverticulosis.  He had about 13 polyps removed.  Patient was feeling dizzy and lightheaded in the ER like he was going to pass out.  He was also diaphoretic.  Denied any other symptoms ?GI and vascular surgery were consulted and patient was admitted for further management as below. ?   ?Assessment and Plan: ?* GI bleeding ?Patient with a known history of diverticulosis who presents to the ER for evaluation of several episodes of painless rectal  bleeding. GI was consulted, CTA reveals active bleeding in the sigmoid. ?Vascular surgery was consulted s/p visceral angiography with embolization done last night on 5/10 ?Patient was started on clinical diet, advance to full liquid today, advance diet as per tolerance, soft diet today, tolerated valve.  Patient was cleared by vascular surgery to discharge and follow-up as an outpatient.  Follow with PCP to repeat CBC after 1 week. ?Diverticulosis of colon, with GI bleeding, managed as above. ?Diabetes mellitus, resumed Metformin and semaglutide on dc, continue diabetic diet and follow with PCP. ?Essential hypertension, Resumed benazepril on dc, patient was hypotensive on admission but BP improved.  Patient was advised to monitor BP at home. ? ?Body mass index is 31.57 kg/m?Marland Kitchen  ?Nutrition Interventions: ?  ?Patient was ambulatory without any assistance. ?On the day of the discharge the patient's vitals were stable, and no other acute medical condition were reported by patient. the patient was felt safe to be discharge at Home. ? ?Consultants: GI and vascular surgery ?Procedures: s/p visceral angiography with embolization done vascular surgery on 5/10 ? ?Discharge Exam: ?General: Appear in no distress, no Rash; Oral Mucosa Clear, moist. ?Cardiovascular: S1 and S2 Present, no Murmur, ?Respiratory: normal respiratory effort, Bilateral Air entry present and no Crackles, no wheezes ?Abdomen: Bowel Sound present, Soft and no tenderness, no hernia ?Extremities: no Pedal edema, no calf tenderness ?Neurology: alert and oriented to time, place, and person ?affect appropriate. ? ?Filed Weights  ? 05/16/21 0126 05/16/21 1555 05/16/21 1651  ?Weight: 99.8 kg 99.1  kg 99.8 kg  ? ?Vitals:  ? 05/18/21 0738 05/18/21 1142  ?BP: (!) 142/89 126/90  ?Pulse: 77 77  ?Resp: 18 18  ?Temp: 97.9 ?F (36.6 ?C) 98.1 ?F (36.7 ?C)  ?SpO2: 98% 100%  ? ? ?DISCHARGE MEDICATION: ?Allergies as of 05/18/2021   ? ?   Reactions  ? Penicillins   ? ?  ? ?   ?Medication List  ?  ? ?TAKE these medications   ? ?benazepril 40 MG tablet ?Commonly known as: LOTENSIN ?Take 1 tablet (40 mg total) by mouth daily. ?  ?benzonatate 100 MG capsule ?Commonly known as: TESSALON ?Take 1 capsule (100 mg total) by mouth 2 (two) times daily as needed for cough. ?  ?ferrous sulfate 325 (65 FE) MG tablet ?Take 1 tablet (325 mg total) by mouth 2 (two) times daily with a meal. ?  ?fexofenadine 180 MG tablet ?Commonly known as: Allegra Allergy ?Take 1 tablet (180 mg total) by mouth daily. ?  ?guaiFENesin-codeine 100-10 MG/5ML syrup ?Commonly known as: ROBITUSSIN AC ?Take 5 mLs by mouth every evening. ?  ?metFORMIN 500 MG tablet ?Commonly known as: GLUCOPHAGE ?Take 1 tablet (500 mg total) by mouth in the morning, at noon, and at bedtime. ?  ?ONE TOUCH ULTRA TEST test strip ?Generic drug: glucose blood ?USE AS DIRECTED. NEED NEW INSURANCE. ?  ?pantoprazole 20 MG tablet ?Commonly known as: Protonix ?Take 1 tablet (20 mg total) by mouth daily. ?  ?Semaglutide (1 MG/DOSE) 4 MG/3ML Sopn ?Inject 1 mg as directed once a week. ?  ?simvastatin 40 MG tablet ?Commonly known as: ZOCOR ?Take 1 tablet (40 mg total) by mouth daily. ?  ? ?  ? ?Allergies  ?Allergen Reactions  ? Penicillins   ? ?Discharge Instructions   ? ? Call MD for:   Complete by: As directed ?  ? GI bleeding  ? Diet - low sodium heart healthy   Complete by: As directed ?  ? Discharge instructions   Complete by: As directed ?  ? Follow-up with PCP in 1 week, repeat CBC after 1 week to check hemoglobin level. ?Return back to ED if any GI bleeding.  ? Increase activity slowly   Complete by: As directed ?  ? ?  ? ? ?The results of significant diagnostics from this hospitalization (including imaging, microbiology, ancillary and laboratory) are listed below for reference.   ? ?Significant Diagnostic Studies: ?PERIPHERAL VASCULAR CATHETERIZATION ? ?Result Date: 05/16/2021 ?See surgical note for result. ? ?CT ANGIO GI BLEED ? ?Result Date:  05/16/2021 ?CLINICAL DATA:  Rectal bleeding. Right red blood per rectum and diarrhea. EXAM: CTA ABDOMEN AND PELVIS WITHOUT AND WITH CONTRAST TECHNIQUE: Multidetector CT imaging of the abdomen and pelvis was performed using the standard protocol during bolus administration of intravenous contrast. Multiplanar reconstructed images and MIPs were obtained and reviewed to evaluate the vascular anatomy. RADIATION DOSE REDUCTION: This exam was performed according to the departmental dose-optimization program which includes automated exposure control, adjustment of the mA and/or kV according to patient size and/or use of iterative reconstruction technique. CONTRAST:  173m OMNIPAQUE IOHEXOL 350 MG/ML SOLN COMPARISON:  08/11/2017 FINDINGS: VASCULAR Aorta: Normal caliber. Stable appearing minimal distal atherosclerotic calcifications. No dissection. Celiac: Normal SMA: Normal Renals: Normal-appearing main renal arteries bilaterally. Smaller second renal arteries bilaterally are grossly normal. There is also a third smaller right renal artery inferiorly. IMA: Patent Inflow: Scattered atherosclerotic calcifications but no aneurysm, dissection or stenosis. Proximal Outflow: Normal Veins: Unremarkable Review of the MIP images confirms the  above findings. NON-VASCULAR Lower chest: No acute pulmonary findings. Surgical scarring changes involving right. Work stable 6 mm right lower lobe pulmonary nodule since 2019, considered benign. Hepatobiliary: No hepatic lesions or intrahepatic biliary dilatation. The gallbladder is unremarkable. No common bile duct dilatation. Pancreas: No mass, inflammation or ductal dilatation. Spleen: Normal size. No focal lesions. Adrenals/Urinary Tract: The adrenal glands are unremarkable and stable. No worrisome renal lesions or hydronephrosis. Multiple simple appearing right renal cysts. Stomach/Bowel: The stomach, duodenum and small bowel are unremarkable. No acute inflammatory process, mass lesion or  obstructive finding. Advanced colonic diverticulosis without findings for acute diverticulitis. The There is evidence of an active bleeding site in the mid sigmoid colon which appears to be mainly associated with a

## 2021-05-21 ENCOUNTER — Telehealth (INDEPENDENT_AMBULATORY_CARE_PROVIDER_SITE_OTHER): Payer: Self-pay

## 2021-05-21 NOTE — Telephone Encounter (Signed)
Patient's spouse called stating the patient had an embolization with Dr. Delana Meyer on 05/16/21 while patient was an inpatient. The spouse was wondering about incision care because there was nothing on the discharge summary. I looked at the discharge summary and did not see any mention of wound or incision care as well. There is an appt to see his PCP on 05/22/21, I explained that may be when your PCP will discuss incision care. The spouse also asked about FMLA paperwork and I advised that she ask the PCP and if they want [place] to fill it out then they will let her know. ?

## 2021-05-22 ENCOUNTER — Ambulatory Visit: Payer: BC Managed Care – PPO | Admitting: Internal Medicine

## 2021-05-22 ENCOUNTER — Encounter: Payer: Self-pay | Admitting: Internal Medicine

## 2021-05-22 VITALS — BP 122/82 | HR 84 | Temp 98.3°F | Ht 70.98 in | Wt 220.0 lb

## 2021-05-22 DIAGNOSIS — E669 Obesity, unspecified: Secondary | ICD-10-CM | POA: Diagnosis not present

## 2021-05-22 DIAGNOSIS — K625 Hemorrhage of anus and rectum: Secondary | ICD-10-CM | POA: Diagnosis not present

## 2021-05-22 DIAGNOSIS — E1169 Type 2 diabetes mellitus with other specified complication: Secondary | ICD-10-CM | POA: Diagnosis not present

## 2021-05-22 LAB — CBC WITH DIFFERENTIAL/PLATELET
Hematocrit: 33.4 % — ABNORMAL LOW (ref 37.5–51.0)
Hemoglobin: 11.9 g/dL — ABNORMAL LOW (ref 13.0–17.7)
Lymphocytes Absolute: 1.4 10*3/uL (ref 0.7–3.1)
Lymphs: 24 %
MCH: 32.3 pg (ref 26.6–33.0)
MCHC: 35.6 g/dL (ref 31.5–35.7)
MCV: 91 fL (ref 79–97)
MID (Absolute): 0.7 10*3/uL (ref 0.1–1.6)
MID: 11 %
Neutrophils Absolute: 4 10*3/uL (ref 1.4–7.0)
Neutrophils: 66 %
Platelets: 240 10*3/uL (ref 150–450)
RBC: 3.68 x10E6/uL — ABNORMAL LOW (ref 4.14–5.80)
RDW: 13.2 % (ref 11.6–15.4)
WBC: 6.1 10*3/uL (ref 3.4–10.8)

## 2021-05-22 LAB — BAYER DCA HB A1C WAIVED: HB A1C (BAYER DCA - WAIVED): 8.3 % — ABNORMAL HIGH (ref 4.8–5.6)

## 2021-05-22 MED ORDER — IRON (FERROUS SULFATE) 325 (65 FE) MG PO TABS: 325.0000 mg | ORAL_TABLET | Freq: Two times a day (BID) | ORAL | 1 refills | Status: DC

## 2021-05-22 MED ORDER — SEMAGLUTIDE (1 MG/DOSE) 4 MG/3ML ~~LOC~~ SOPN
1.0000 mg | PEN_INJECTOR | SUBCUTANEOUS | 6 refills | Status: DC
Start: 2021-05-22 — End: 2022-08-23

## 2021-05-22 MED ORDER — SEMAGLUTIDE (2 MG/DOSE) 8 MG/3ML ~~LOC~~ SOPN: 2.0000 mg | PEN_INJECTOR | SUBCUTANEOUS | 11 refills | Status: DC

## 2021-05-22 MED ORDER — PANTOPRAZOLE SODIUM 20 MG PO TBEC: 20.0000 mg | DELAYED_RELEASE_TABLET | Freq: Every day | ORAL | 4 refills | Status: DC

## 2021-05-22 NOTE — Progress Notes (Signed)
? ?BP 122/82   Pulse 84   Temp 98.3 ?F (36.8 ?C) (Oral)   Ht 5' 10.98" (1.803 m)   Wt 220 lb (99.8 kg)   SpO2 98%   BMI 30.70 kg/m?   ? ?Subjective:  ? ? Patient ID: Matthew Collier, male    DOB: 03/26/1965, 56 y.o.   MRN: [id number] ? ?Chief Complaint  ?Patient presents with  ? Hospitalization Follow-up  ?  GI bleed ?  ? ? ?HPI: ?Matthew Collier is a 56 y.o. male ? ?Pt was admitted Wednesday - Friday was waiting in the ER for 4 hours. Per pt he was given the option to go home and come back if it got worse. His HB was 10 --- then dropped to 8.7  ?Pt is here for a hosp fu, he was in the ER for frank bloody stool.  He had bright red blood per rectum felt like he was going to pass out.  ? 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  ? ?Anemia ?Presents for follow-up visit. Symptoms include light-headedness, malaise/fatigue, pallor and paresthesias. There has been no abdominal pain, anorexia, bruising/bleeding easily, confusion, fever, leg swelling, palpitations, pica or weight loss.  ? ? ? ?Chief Complaint  ?Patient presents with  ? Hospitalization Follow-up  ?  GI bleed ?  ? ? ?Relevant past medical, surgical, family and social history reviewed and updated as indicated. Interim medical history since our last visit reviewed. ?Allergies and medications reviewed and updated. ? ?Review of Systems  ?Constitutional:  Positive for malaise/fatigue. Negative for fever and weight loss.  ?Cardiovascular:  Negative for palpitations.  ?Gastrointestinal:  Negative for abdominal pain and anorexia.  ?Skin:  Positive for pallor.  ?Neurological:  Positive for light-headedness and paresthesias.  ?Hematological:  Does not bruise/bleed easily.  ?Psychiatric/Behavioral:  Negative for confusion.   ? ?Per HPI unless specifically indicated above ? ?   ?Objective:  ?  ?BP 122/82   Pulse 84   Temp 98.3 ?F (36.8 ?C) (Oral)   Ht 5' 10.98" (1.803 m)   Wt 220 lb (99.8 kg)   SpO2 98%   BMI 30.70 kg/m?   ?Wt Readings from Last 3  Encounters:  ?05/22/21 220 lb (99.8 kg)  ?05/16/21 220 lb (99.8 kg)  ?04/25/21 221 lb 12.8 oz (100.6 kg)  ?  ?Physical Exam ?Vitals and nursing note reviewed.  ?Constitutional:   ?   General: He is not in acute distress. ?   Appearance: Normal appearance. He is not ill-appearing or diaphoretic.  ?HENT:  ?   Head: Normocephalic and atraumatic.  ?   Right Ear: Tympanic membrane and external ear normal. There is no impacted cerumen.  ?   Left Ear: External ear normal.  ?   Nose: No congestion or rhinorrhea.  ?   Mouth/Throat:  ?   Pharynx: No oropharyngeal exudate or posterior oropharyngeal erythema.  ?Eyes:  ?   Conjunctiva/sclera: Conjunctivae normal.  ?   Pupils: Pupils are equal, round, and reactive to light.  ?Cardiovascular:  ?   Rate and Rhythm: Normal rate and regular rhythm.  ?   Heart sounds: No murmur heard. ?  No friction rub. No gallop.  ?Pulmonary:  ?   Effort: No respiratory distress.  ?   Breath sounds: No stridor. No wheezing or rhonchi.  ?Chest:  ?   Chest wall: No tenderness.  ?Abdominal:  ?   General: Abdomen is flat. Bowel sounds are normal.  ?  Palpations: Abdomen is soft. There is no mass.  ?   Tenderness: There is no abdominal tenderness.  ?Musculoskeletal:  ?   Cervical back: Normal range of motion and neck supple. No rigidity or tenderness.  ?   Left lower leg: No edema.  ?Skin: ?   General: Skin is warm and dry.  ?Neurological:  ?   Mental Status: He is alert.  ? ? ?Results for orders placed or performed in visit on 05/22/21  ?Bayer DCA Hb A1c Waived (STAT)  ?Result Value Ref Range  ? HB A1C (BAYER DCA - WAIVED) 8.3 (H) 4.8 - 5.6 %  ?CBC With Differential/Platelet  ?Result Value Ref Range  ? WBC 6.1 3.4 - 10.8 x10E3/uL  ? RBC 3.68 (L) 4.14 - 5.80 x10E6/uL  ? Hemoglobin 11.9 (L) 13.0 - 17.7 g/dL  ? Hematocrit 33.4 (L) 37.5 - 51.0 %  ? MCV 91 79 - 97 fL  ? MCH 32.3 26.6 - 33.0 pg  ? MCHC 35.6 31.5 - 35.7 g/dL  ? RDW 13.2 11.6 - 15.4 %  ? Platelets 240 150 - 450 x10E3/uL  ? Neutrophils 66 Not  Estab. %  ? Lymphs 24 Not Estab. %  ? MID 11 Not Estab. %  ? Neutrophils Absolute 4.0 1.4 - 7.0 x10E3/uL  ? Lymphocytes Absolute 1.4 0.7 - 3.1 x10E3/uL  ? MID (Absolute) 0.7 0.1 - 1.6 X10E3/uL  ? ?   ? ? ?Current Outpatient Medications:  ?  benazepril (LOTENSIN) 40 MG tablet, Take 1 tablet (40 mg total) by mouth daily., Disp: 90 tablet, Rfl: 4 ?  ferrous sulfate 325 (65 FE) MG tablet, Take 1 tablet (325 mg total) by mouth 2 (two) times daily with a meal., Disp: 60 tablet, Rfl: 6 ?  fexofenadine (ALLEGRA ALLERGY) 180 MG tablet, Take 1 tablet (180 mg total) by mouth daily., Disp: 10 tablet, Rfl: 1 ?  Iron, Ferrous Sulfate, 325 (65 Fe) MG TABS, Take 325 mg by mouth in the morning and at bedtime., Disp: 60 tablet, Rfl: 1 ?  metFORMIN (GLUCOPHAGE) 500 MG tablet, Take 1 tablet (500 mg total) by mouth in the morning, at noon, and at bedtime., Disp: 90 tablet, Rfl: 1 ?  ONE TOUCH ULTRA TEST test strip, USE AS DIRECTED. NEED NEW INSURANCE., Disp: 100 each, Rfl: 12 ?  Semaglutide, 1 MG/DOSE, 4 MG/3ML SOPN, Inject 1 mg as directed once a week., Disp: 3 mL, Rfl: 6 ?  simvastatin (ZOCOR) 40 MG tablet, Take 1 tablet (40 mg total) by mouth daily., Disp: 90 tablet, Rfl: 4 ?  pantoprazole (PROTONIX) 20 MG tablet, Take 1 tablet (20 mg total) by mouth daily., Disp: 90 tablet, Rfl: 4  ? ? ?Assessment & Plan:  ?Gi bleed / anemia will check today. ?Will need to fu with GI asap ? ?2. DM improving:  ?A1c much improved.  ?Will increase ozempic to 1 mg q week.  ?check HbA1c,  urine  microalbumin  diabetic diet plan given to pt  adviced regarding hypoglycemia and instructions given to pt today on how to prevent and treat the same if it were to occur. pt acknowledges the plan and voices understanding of the same.  exercise plan given and encouraged.   advice diabetic yearly podiatry, ophthalmology , nutritionist , dental check q 6 months. ? ? ?Problem List Items Addressed This Visit   ? ?  ? Digestive  ? GI bleeding - Primary  ? Relevant  Orders  ? CBC With Differential/Platelet  ? Ambulatory referral  to Gastroenterology  ?  ? Endocrine  ? Type 2 diabetes mellitus with obesity ([place])  ? Relevant Medications  ? Semaglutide, 1 MG/DOSE, 4 MG/3ML SOPN  ? Other Relevant Orders  ? Bayer DCA Hb A1c Waived (STAT) (Completed)  ?  ? ?Orders Placed This Encounter  ?Procedures  ? CBC With Differential/Platelet  ? Bayer DCA Hb A1c Waived (STAT)  ? CBC With Differential/Platelet  ? Ambulatory referral to Gastroenterology  ?  ? ?Meds ordered this encounter  ?Medications  ? pantoprazole (PROTONIX) 20 MG tablet  ?  Sig: Take 1 tablet (20 mg total) by mouth daily.  ?  Dispense:  90 tablet  ?  Refill:  4  ? Iron, Ferrous Sulfate, 325 (65 Fe) MG TABS  ?  Sig: Take 325 mg by mouth in the morning and at bedtime.  ?  Dispense:  60 tablet  ?  Refill:  1  ? DISCONTD: Semaglutide, 2 MG/DOSE, 8 MG/3ML SOPN  ?  Sig: Inject 2 mg as directed once a week.  ?  Dispense:  3 mL  ?  Refill:  11  ? Semaglutide, 1 MG/DOSE, 4 MG/3ML SOPN  ?  Sig: Inject 1 mg as directed once a week.  ?  Dispense:  3 mL  ?  Refill:  6  ?  ? ?Follow up plan: ?Return in about 3 weeks (around 06/12/2021). ? ?

## 2021-05-23 ENCOUNTER — Other Ambulatory Visit: Payer: Self-pay

## 2021-05-23 ENCOUNTER — Encounter: Payer: Self-pay | Admitting: Gastroenterology

## 2021-05-23 ENCOUNTER — Ambulatory Visit (INDEPENDENT_AMBULATORY_CARE_PROVIDER_SITE_OTHER): Payer: BC Managed Care – PPO | Admitting: Gastroenterology

## 2021-05-23 VITALS — BP 154/99 | HR 111 | Temp 99.8°F | Ht 70.0 in | Wt 220.2 lb

## 2021-05-23 DIAGNOSIS — K2270 Barrett's esophagus without dysplasia: Secondary | ICD-10-CM

## 2021-05-23 DIAGNOSIS — E669 Obesity, unspecified: Secondary | ICD-10-CM

## 2021-05-23 DIAGNOSIS — D62 Acute posthemorrhagic anemia: Secondary | ICD-10-CM

## 2021-05-23 DIAGNOSIS — Z86010 Personal history of colonic polyps: Secondary | ICD-10-CM

## 2021-05-23 DIAGNOSIS — E1169 Type 2 diabetes mellitus with other specified complication: Secondary | ICD-10-CM | POA: Diagnosis not present

## 2021-05-23 DIAGNOSIS — K625 Hemorrhage of anus and rectum: Secondary | ICD-10-CM | POA: Diagnosis not present

## 2021-05-23 MED ORDER — NA SULFATE-K SULFATE-MG SULF 17.5-3.13-1.6 GM/177ML PO SOLN: 354.0000 mL | Freq: Once | ORAL | 0 refills | Status: AC

## 2021-05-23 MED ORDER — PANTOPRAZOLE SODIUM 40 MG PO TBEC: 40.0000 mg | DELAYED_RELEASE_TABLET | Freq: Two times a day (BID) | ORAL | 3 refills | Status: DC

## 2021-05-23 NOTE — Addendum Note (Signed)
Addended by: Cephas Darby on: 05/23/2021 04:52 PM ? ? Modules accepted: Level of Service ? ?

## 2021-05-23 NOTE — Progress Notes (Signed)
?  ?Cephas Darby, MD ?[address]  ?[address]  ?[place], [place] [address]  ?Main: [phone]  ?Fax: [phone] ? ? ? ?Gastroenterology Consultation ? ?Referring Provider:     Charlynne Cousins, MD ?Primary Care Physician:  Charlynne Cousins, MD ?Primary Gastroenterologist:  Dr. Cephas Darby ?Reason for Consultation: Rectal bleeding, hospital follow-up ?      ? HPI:   ?Matthew Collier is a 56 y.o. male referred by Dr. Charlynne Cousins, MD  for consultation & management of diverticular bleed.  Patient has history of metabolic syndrome, who was recently admitted to [facility] secondary to rectal bleeding, CT angio was positive for active extravasation of blood from sigmoid colon, likely diverticular bleed s/p embolization.  Patient has known history of sigmoid diverticulosis based on the colonoscopy in 2019.  He does have history of Barrett's esophagus without dysplasia.  Patient's hemoglobin dropped to 8.7 from baseline of normal hemoglobin. ? ?Interval summary ?Patient reports that his bleeding has subsided.  He has been having regular bowel movements.  He is hemoglobin after hospital discharge was 11.9 on 5/16.  Patient used to take oral iron in the past.  He is currently taking Protonix 20 mg daily for history of Barrett's esophagus.  He is also overdue for surveillance of Barrett's esophagus and history of colon polyps ? ?Patient does not smoke.  He does admit to drinking alcohol 6-8 beers during weekends ? ?NSAIDs: None ? ?Antiplts/Anticoagulants/Anti thrombotics: None ? ?GI Procedures: EGD and colonoscopy in 09/2017 ?- Esophageal mucosal changes classified as Barrett's stage C0-M2 per Prague criteria. Biopsied. ?- Ectopic gastric mucosa in the proximal esophagus. Biopsied. ?- Non-bleeding erosive gastropathy. Biopsied. ?- Erythematous mucosa in the antrum. Biopsied. ?- Normal duodenal bulb, second portion of the duodenum and examined duodenum. ?- Biopsies were obtained in the gastric body, at the incisura and in the  gastric antrum. ? ?- Three 4 to 6 mm polyps in the cecum, removed with a cold snare. Resected and retrieved. ?- Medium-sized lipoma in the ascending colon. Biopsied. ?- Five 3 to 6 mm polyps in the descending colon and in the transverse colon, removed with a cold snare. Resected ?and retrieved. ?- Two 8 to 10 mm polyps in the sigmoid colon, removed with a hot snare. Resected and retrieved. ?- Three 4 to 5 mm polyps at the recto-sigmoid colon, removed with a cold snare. Resected and retrieved. ?- Diverticulosis in the sigmoid colon. ?- The examination was otherwise normal. ?- The rectum, sigmoid colon, descending colon, transverse colon, ascending colon and cecum are normal. ?DIAGNOSIS:  ? ?A. STOMACH, RULE OUT H. PYLORI; COLD BIOPSY:  ?- MILD CHRONIC NONSPECIFIC GASTRITIS.  ?- NO HELICOBACTER PYLORI ARE IDENTIFIED.  ? ?B. ESOPHAGUS, RULE OUT BARRETT'S AT 39 CM; COLD BIOPSY:  ?- BARRETT'S MUCOSA SHOWING MINIMAL CHRONIC INFLAMMATION.  ?- NEGATIVE FOR DYSPLASIA AND MALIGNANCY.  ? ?C. ESOPHAGUS, AT 38 CM FOR BARRETT'S; COLD BIOPSY:  ?- SQUAMOUS AND GASTRIC TYPE MUCOSA, NO PATHOLOGIC CHANGES.  ?- NO INTESTINAL METAPLASIA IS IDENTIFIED.  ? ?D. ESOPHAGUS, AT 37 CM TO RULE OUT BARRETT'S; COLD BIOPSY:  ?- SQUAMOUS AND GASTRIC TYPE MUCOSA SHOWING MILD NONSPECIFIC CHRONIC  ?INFLAMMATION.  ?- NO INTESTINAL METAPLASIA IS IDENTIFIED.  ? ?E. ESOPHAGUS, ECTOPIC GASTRIC TISSUE AT 17 CM; COLD BIOPSY:  ?- HETEROTOPIC GASTRIC MUCOSA.  ?- NEGATIVE FOR DYSPLASIA AND MALIGNANCY.  ? ?F. COLON POLYP X3, CECUM; COLD SNARE:  ?- TUBULAR ADENOMAS (MULTIPLE FRAGMENTS).  ?- NEGATIVE FOR HIGH-GRADE DYSPLASIA AND MALIGNANCY.  ? ?G.  COLON, LIPOMA ASCENDING; COLD BIOPSY:  ?- SUPERFICIAL COLONIC MUCOSA.  ?- NEGATIVE FOR DYSPLASIA AND MALIGNANCY.  ? ?H. COLON POLYP X5, TRANSVERSE AND DESCENDING, COLD SNARE:  ?- TUBULAR ADENOMAS (MULTIPLE FRAGMENTS).  ?- HYPERPLASTIC POLYPS (2 FRAGMENTS).  ?- NEGATIVE FOR HIGH-GRADE DYSPLASIA AND MALIGNANCY.  ? ?I.  COLON POLYP, RECTO-SIGMOID; COLD SNARE X3/HOT SNARE X2:  ?- SESSILE SERRATED ADENOMAS (3).  ?- HYPERPLASTIC POLYPS (2).  ?- NEGATIVE FOR HIGH-GRADE DYSPLASIA AND MALIGNANCY.  ? ?Past Medical History:  ?Diagnosis Date  ? Diabetes mellitus without complication ([place])   ? Hyperlipidemia   ? Hypertension   ? Lab test positive for detection of COVID-19 virus 02/16/2020  ? ? ?Past Surgical History:  ?Procedure Laterality Date  ? COLONOSCOPY WITH PROPOFOL N/A 10/03/2017  ? Procedure: COLONOSCOPY WITH PROPOFOL;  Surgeon: Virgel Manifold, MD;  Location: [facility] ENDOSCOPY;  Service: Endoscopy;  Laterality: N/A;  ? EMBOLIZATION N/A 05/16/2021  ? Procedure: EMBOLIZATION;  Surgeon: Katha Cabal, MD;  Location: [facility];  Service: Cardiovascular;  Laterality: N/A;  ? ESOPHAGOGASTRODUODENOSCOPY (EGD) WITH PROPOFOL N/A 10/03/2017  ? Procedure: ESOPHAGOGASTRODUODENOSCOPY (EGD) WITH PROPOFOL;  Surgeon: Virgel Manifold, MD;  Location: [facility] ENDOSCOPY;  Service: Endoscopy;  Laterality: N/A;  ? PLEURAL SCARIFICATION    ? ? ?Current Outpatient Medications:  ?  benazepril (LOTENSIN) 40 MG tablet, Take 1 tablet (40 mg total) by mouth daily., Disp: 90 tablet, Rfl: 4 ?  ferrous sulfate 325 (65 FE) MG tablet, Take 1 tablet (325 mg total) by mouth 2 (two) times daily with a meal., Disp: 60 tablet, Rfl: 6 ?  fexofenadine (ALLEGRA ALLERGY) 180 MG tablet, Take 1 tablet (180 mg total) by mouth daily., Disp: 10 tablet, Rfl: 1 ?  Iron, Ferrous Sulfate, 325 (65 Fe) MG TABS, Take 325 mg by mouth in the morning and at bedtime., Disp: 60 tablet, Rfl: 1 ?  metFORMIN (GLUCOPHAGE) 500 MG tablet, Take 1 tablet (500 mg total) by mouth in the morning, at noon, and at bedtime., Disp: 90 tablet, Rfl: 1 ?  Na Sulfate-K Sulfate-Mg Sulf 17.5-3.13-1.6 GM/177ML SOLN, Take 354 mLs by mouth once for 1 dose., Disp: 354 mL, Rfl: 0 ?  ONE TOUCH ULTRA TEST test strip, USE AS DIRECTED. NEED NEW INSURANCE., Disp: 100 each, Rfl: 12 ?  pantoprazole  (PROTONIX) 40 MG tablet, Take 1 tablet (40 mg total) by mouth 2 (two) times daily before a meal., Disp: 180 tablet, Rfl: 3 ?  Semaglutide, 1 MG/DOSE, 4 MG/3ML SOPN, Inject 1 mg as directed once a week., Disp: 3 mL, Rfl: 6 ?  simvastatin (ZOCOR) 40 MG tablet, Take 1 tablet (40 mg total) by mouth daily., Disp: 90 tablet, Rfl: 4 ? ?Family History  ?Problem Relation Age of Onset  ? Hypertension Mother   ? Diabetes Mother   ? Hypertension Father   ? Cancer Father   ?  ? ?Social History  ? ?Tobacco Use  ? Smoking status: Never  ? Smokeless tobacco: Never  ?Vaping Use  ? Vaping Use: Never used  ?Substance Use Topics  ? Alcohol use: Yes  ?  Comment: occasional  ? Drug use: No  ? ? ?Allergies as of 05/23/2021 - Review Complete 05/23/2021  ?Allergen Reaction Noted  ? Penicillins  09/02/2014  ? ? ?Review of Systems:    ?All systems reviewed and negative except where noted in HPI. ? ? Physical Exam:  ?BP (!) 154/99 (BP Location: Left Arm, Patient Position: Sitting, Cuff Size: Normal)   Pulse Marland Kitchen)  111   Temp 99.8 ?F (37.7 ?C) (Oral)   Ht '5\' 10"'$  (1.778 m)   Wt 220 lb 4 oz (99.9 kg)   BMI 31.60 kg/m?  ?No LMP for male patient. ? ?General:   Alert,  Well-developed, well-nourished, pleasant and cooperative in NAD ?Head:  Normocephalic and atraumatic. ?Eyes:  Sclera clear, no icterus.   Conjunctiva pink. ?Ears:  Normal auditory acuity. ?Nose:  No deformity, discharge, or lesions. ?Mouth:  No deformity or lesions,oropharynx pink & moist. ?Neck:  Supple; no masses or thyromegaly. ?Lungs:  Respirations even and unlabored.  Clear throughout to auscultation.   No wheezes, crackles, or rhonchi. No acute distress. ?Heart:  Regular rate and rhythm; no murmurs, clicks, rubs, or gallops. ?Abdomen:  Normal bowel sounds. Soft, non-tender and non-distended without masses, hepatosplenomegaly or hernias noted.  No guarding or rebound tenderness.   ?Rectal: Not performed ?Msk:  Symmetrical without gross deformities. Good, equal movement &  strength bilaterally. ?Pulses:  Normal pulses noted. ?Extremities:  No clubbing or edema.  No cyanosis. ?Neurologic:  Alert and oriented x3;  grossly normal neurologically. ?Skin:  Intact without significant lesions or rashes.

## 2021-05-24 LAB — B12 AND FOLATE PANEL
Folate: 18.2 ng/mL (ref >3.0)
Vitamin B-12: 342 pg/mL (ref 232–1245)

## 2021-05-24 LAB — IRON,TIBC AND FERRITIN PANEL
Ferritin: 143 ng/mL (ref 30–400)
Iron Saturation: 18 % (ref 15–55)
Iron: 69 ug/dL (ref 38–169)
Total Iron Binding Capacity: 377 ug/dL (ref 250–450)
UIBC: 308 ug/dL (ref 111–343)

## 2021-06-06 ENCOUNTER — Other Ambulatory Visit: Payer: Self-pay | Admitting: Internal Medicine

## 2021-06-07 NOTE — Telephone Encounter (Signed)
For future to keep on file. Requested Prescriptions  Pending Prescriptions Disp Refills  . metFORMIN (GLUCOPHAGE) 500 MG tablet [Pharmacy Med Name: METFORMIN HCL 500 MG TAB] 90 tablet 1    Sig: TAKE 1 TABLET BY MOUTH ONCE EVERY MORNING  AT NOON AND AT BEDTIME     Endocrinology:  Diabetes - Biguanides Failed - 06/06/2021 12:21 PM      Failed - HBA1C is between 0 and 7.9 and within 180 days    HB A1C (BAYER DCA - WAIVED)  Date Value Ref Range Status  05/22/2021 8.3 (H) 4.8 - 5.6 % Final    Comment:             Prediabetes: 5.7 - 6.4          Diabetes: >6.4          Glycemic control for adults with diabetes: <7.0          Passed - Cr in normal range and within 360 days    Creatinine, Ser  Date Value Ref Range Status  05/18/2021 0.78 0.61 - 1.24 mg/dL Final         Passed - eGFR in normal range and within 360 days    GFR calc Af Amer  Date Value Ref Range Status  01/04/2020 117 >59 mL/min/1.73 Final    Comment:    **In accordance with recommendations from the NKF-ASN Task force,**   [facility] is in the process of updating its eGFR calculation to the   2021 CKD-EPI creatinine equation that estimates kidney function   without a race variable.    GFR, Estimated  Date Value Ref Range Status  05/18/2021 >60 >60 mL/min Final    Comment:    (NOTE) Calculated using the CKD-EPI Creatinine Equation (2021)    eGFR  Date Value Ref Range Status  05/14/2021 105 >59 mL/min/1.73 Final         Passed - B12 Level in normal range and within 720 days    Vitamin B-12  Date Value Ref Range Status  05/23/2021 342 232 - 1,245 pg/mL Final         Passed - Valid encounter within last 6 months    Recent Outpatient Visits          2 weeks ago Gastrointestinal hemorrhage associated with anorectal source   [facility] Vigg, Avanti, MD   1 month ago Upper respiratory tract infection, unspecified type   [facility] Vigg, Avanti, MD   1 month ago Hypertension  associated with diabetes ([place])   [facility] Vigg, Avanti, MD   1 year ago Type 2 diabetes mellitus with obesity ([place])   [place], Henrine Screws T, NP   1 year ago History of 2019 novel coronavirus disease (COVID-19)   [place], Barbaraann Faster, NP      Future Appointments            In 2 weeks Vigg, Avanti, MD [employer] [place]   In 5 months Vanga, Tally Due, MD [facility] [place]           Passed - CBC within normal limits and completed in the last 12 months    WBC  Date Value Ref Range Status  05/22/2021 6.1 3.4 - 10.8 x10E3/uL Final  05/18/2021 4.9 4.0 - 10.5 K/uL Final   RBC  Date Value Ref Range Status  05/22/2021 3.68 (L) 4.14 - 5.80 x10E6/uL Final  05/18/2021 3.01 (L) 4.22 - 5.81  MIL/uL Final   Hemoglobin  Date Value Ref Range Status  05/22/2021 11.9 (L) 13.0 - 17.7 g/dL Final   Hematocrit  Date Value Ref Range Status  05/22/2021 33.4 (L) 37.5 - 51.0 % Final   MCHC  Date Value Ref Range Status  05/22/2021 35.6 31.5 - 35.7 g/dL Final  05/18/2021 35.4 30.0 - 36.0 g/dL Final   [facility]  Date Value Ref Range Status  05/22/2021 32.3 26.6 - 33.0 pg Final  05/18/2021 32.2 26.0 - 34.0 pg Final   MCV  Date Value Ref Range Status  05/22/2021 91 79 - 97 fL Final   No results found for: PLTCOUNTKUC, LABPLAT, POCPLA RDW  Date Value Ref Range Status  05/22/2021 13.2 11.6 - 15.4 % Final

## 2021-06-12 ENCOUNTER — Ambulatory Visit: Payer: BC Managed Care – PPO | Admitting: Internal Medicine

## 2021-06-15 ENCOUNTER — Encounter
Admission: RE | Disposition: A | Discharge status: Home / Self Care | Payer: Self-pay | Source: Home / Self Care | Attending: Gastroenterology

## 2021-06-15 ENCOUNTER — Ambulatory Visit: Payer: BC Managed Care – PPO | Admitting: Anesthesiology

## 2021-06-15 ENCOUNTER — Encounter: Payer: Self-pay | Admitting: Gastroenterology

## 2021-06-15 ENCOUNTER — Ambulatory Visit
Admission: RE | Admit: 2021-06-15 | Discharge: 2021-06-15 | Disposition: A | Discharge status: Home / Self Care | Payer: BC Managed Care – PPO | Attending: Gastroenterology | Admitting: Gastroenterology

## 2021-06-15 DIAGNOSIS — I10 Essential (primary) hypertension: Secondary | ICD-10-CM | POA: Diagnosis not present

## 2021-06-15 DIAGNOSIS — E119 Type 2 diabetes mellitus without complications: Secondary | ICD-10-CM | POA: Diagnosis not present

## 2021-06-15 DIAGNOSIS — K5730 Diverticulosis of large intestine without perforation or abscess without bleeding: Secondary | ICD-10-CM | POA: Diagnosis not present

## 2021-06-15 DIAGNOSIS — K644 Residual hemorrhoidal skin tags: Secondary | ICD-10-CM | POA: Diagnosis not present

## 2021-06-15 DIAGNOSIS — Z6831 Body mass index (BMI) 31.0-31.9, adult: Secondary | ICD-10-CM | POA: Diagnosis not present

## 2021-06-15 DIAGNOSIS — K2100 Gastro-esophageal reflux disease with esophagitis, without bleeding: Secondary | ICD-10-CM | POA: Insufficient documentation

## 2021-06-15 DIAGNOSIS — Z1211 Encounter for screening for malignant neoplasm of colon: Secondary | ICD-10-CM | POA: Insufficient documentation

## 2021-06-15 DIAGNOSIS — K2270 Barrett's esophagus without dysplasia: Secondary | ICD-10-CM | POA: Diagnosis not present

## 2021-06-15 DIAGNOSIS — K635 Polyp of colon: Secondary | ICD-10-CM | POA: Diagnosis not present

## 2021-06-15 DIAGNOSIS — E785 Hyperlipidemia, unspecified: Secondary | ICD-10-CM | POA: Diagnosis not present

## 2021-06-15 DIAGNOSIS — D649 Anemia, unspecified: Secondary | ICD-10-CM | POA: Insufficient documentation

## 2021-06-15 DIAGNOSIS — K621 Rectal polyp: Secondary | ICD-10-CM | POA: Diagnosis not present

## 2021-06-15 DIAGNOSIS — E669 Obesity, unspecified: Secondary | ICD-10-CM | POA: Diagnosis not present

## 2021-06-15 DIAGNOSIS — D123 Benign neoplasm of transverse colon: Secondary | ICD-10-CM | POA: Insufficient documentation

## 2021-06-15 DIAGNOSIS — Z86010 Personal history of colonic polyps: Secondary | ICD-10-CM | POA: Diagnosis not present

## 2021-06-15 DIAGNOSIS — Z7984 Long term (current) use of oral hypoglycemic drugs: Secondary | ICD-10-CM | POA: Diagnosis not present

## 2021-06-15 HISTORY — PX: ESOPHAGOGASTRODUODENOSCOPY (EGD) WITH PROPOFOL: SHX5813

## 2021-06-15 HISTORY — PX: COLONOSCOPY WITH PROPOFOL: SHX5780

## 2021-06-15 SURGERY — COLONOSCOPY WITH PROPOFOL
Anesthesia: General

## 2021-06-15 MED ORDER — GLYCOPYRROLATE 0.2 MG/ML IJ SOLN
INTRAMUSCULAR | Status: DC | PRN
  Administered 2021-06-15: .2 mg via INTRAVENOUS

## 2021-06-15 MED ORDER — SODIUM CHLORIDE 0.9 % IV SOLN: INTRAVENOUS | Status: DC

## 2021-06-15 MED ORDER — SODIUM CHLORIDE 0.9 % IV SOLN: INTRAVENOUS | Status: DC | PRN

## 2021-06-15 MED ORDER — PROPOFOL 500 MG/50ML IV EMUL
INTRAVENOUS | Status: DC | PRN
  Administered 2021-06-15: 50 mg via INTRAVENOUS
  Administered 2021-06-15: 40 mg via INTRAVENOUS
  Administered 2021-06-15: 180 ug/kg/min via INTRAVENOUS
  Administered 2021-06-15: 100 mg via INTRAVENOUS

## 2021-06-15 MED ORDER — PROPOFOL 500 MG/50ML IV EMUL
INTRAVENOUS | Status: AC
  Filled 2021-06-15: qty 50

## 2021-06-15 NOTE — Anesthesia Preprocedure Evaluation (Signed)
Anesthesia Evaluation  Patient identified by MRN, date of birth, ID band Patient awake    Reviewed: Allergy & Precautions, NPO status , Patient's Chart, lab work & pertinent test results  Airway Mallampati: II  TM Distance: >3 FB Neck ROM: full    Dental  (+) Chipped   Pulmonary neg pulmonary ROS,    Pulmonary exam normal        Cardiovascular Exercise Tolerance: Good hypertension, Pt. on medications Normal cardiovascular exam     Neuro/Psych negative neurological ROS  negative psych ROS   GI/Hepatic Neg liver ROS, GERD  Controlled and Medicated,Recent GI bleed   Endo/Other  diabetes, Well Controlled, Type 2, Oral Hypoglycemic Agents  Renal/GU negative Renal ROS  negative genitourinary   Musculoskeletal   Abdominal (+) + obese,   Peds  Hematology  (+) Blood dyscrasia, anemia ,   Anesthesia Other Findings Past Medical History: No date: Diabetes mellitus without complication ([place]) 8/84/1660: GI bleed 05/16/2021: GI bleeding No date: Hyperlipidemia No date: Hypertension 02/16/2020: Lab test positive for detection of COVID-19 virus  Past Surgical History: 10/03/2017: COLONOSCOPY WITH PROPOFOL; N/A     Comment:  Procedure: COLONOSCOPY WITH PROPOFOL;  Surgeon:               Virgel Manifold, MD;  Location: [facility] ENDOSCOPY;                Service: Endoscopy;  Laterality: N/A; 05/16/2021: EMBOLIZATION; N/A     Comment:  Procedure: EMBOLIZATION;  Surgeon: Katha Cabal,               MD;  Location: [facility];  Service:               Cardiovascular;  Laterality: N/A; 10/03/2017: ESOPHAGOGASTRODUODENOSCOPY (EGD) WITH PROPOFOL; N/A     Comment:  Procedure: ESOPHAGOGASTRODUODENOSCOPY (EGD) WITH               PROPOFOL;  Surgeon: Virgel Manifold, MD;  Location:               [facility] ENDOSCOPY;  Service: Endoscopy;  Laterality: N/A; No date: PLEURAL SCARIFICATION  BMI    Body Mass Index: 31.57 kg/m       Reproductive/Obstetrics negative OB ROS                             Anesthesia Physical Anesthesia Plan  ASA: 2  Anesthesia Plan: General   Post-op Pain Management:    Induction: Intravenous  PONV Risk Score and Plan: Propofol infusion and TIVA  Airway Management Planned: Natural Airway  Additional Equipment:   Intra-op Plan:   Post-operative Plan:   Informed Consent: I have reviewed the patients History and Physical, chart, labs and discussed the procedure including the risks, benefits and alternatives for the proposed anesthesia with the patient or authorized representative who has indicated his/her understanding and acceptance.     Dental Advisory Given  Plan Discussed with: Anesthesiologist, CRNA and Surgeon  Anesthesia Plan Comments:         Anesthesia Quick Evaluation

## 2021-06-15 NOTE — H&P (Signed)
Cephas Darby, MD [address]  [place]  [place], [place] [address]  Main: [phone]  Fax: [phone] Pager: [phone]  Primary Care Physician:  Charlynne Cousins, MD Primary Gastroenterologist:  Dr. Cephas Darby  Pre-Procedure History & Physical: HPI:  Matthew Collier is a 56 y.o. male is here for an endoscopy and colonoscopy.   Past Medical History:  Diagnosis Date   Diabetes mellitus without complication ([place])    GI bleed 05/16/2021   GI bleeding 05/16/2021   Hyperlipidemia    Hypertension    Lab test positive for detection of COVID-19 virus 02/16/2020    Past Surgical History:  Procedure Laterality Date   COLONOSCOPY WITH PROPOFOL N/A 10/03/2017   Procedure: COLONOSCOPY WITH PROPOFOL;  Surgeon: Virgel Manifold, MD;  Location: [facility] ENDOSCOPY;  Service: Endoscopy;  Laterality: N/A;   EMBOLIZATION N/A 05/16/2021   Procedure: EMBOLIZATION;  Surgeon: Katha Cabal, MD;  Location: [place] [facility];  Service: Cardiovascular;  Laterality: N/A;   ESOPHAGOGASTRODUODENOSCOPY (EGD) WITH PROPOFOL N/A 10/03/2017   Procedure: ESOPHAGOGASTRODUODENOSCOPY (EGD) WITH PROPOFOL;  Surgeon: Virgel Manifold, MD;  Location: [facility] ENDOSCOPY;  Service: Endoscopy;  Laterality: N/A;   PLEURAL SCARIFICATION      Prior to Admission medications   Medication Sig Start Date End Date Taking? Authorizing Provider  benazepril (LOTENSIN) 40 MG tablet Take 1 tablet (40 mg total) by mouth daily. 04/03/20  Yes [place], Henrine Screws T, NP  ferrous sulfate 325 (65 FE) MG tablet Take 1 tablet (325 mg total) by mouth 2 (two) times daily with a meal. 10/27/17  Yes [place], Jeannette How, MD  fexofenadine (ALLEGRA ALLERGY) 180 MG tablet Take 1 tablet (180 mg total) by mouth daily. 04/25/21  Yes Vigg, Avanti, MD  Iron, Ferrous Sulfate, 325 (65 Fe) MG TABS Take 325 mg by mouth in the morning and at bedtime. 05/22/21  Yes Vigg, Avanti, MD  metFORMIN (GLUCOPHAGE) 500 MG tablet TAKE 1 TABLET BY MOUTH ONCE EVERY  MORNING  AT NOON AND AT BEDTIME 06/07/21  Yes Vigg, Avanti, MD  pantoprazole (PROTONIX) 40 MG tablet Take 1 tablet (40 mg total) by mouth 2 (two) times daily before a meal. 05/23/21  Yes Margaretmary Prisk, Tally Due, MD  Semaglutide, 1 MG/DOSE, 4 MG/3ML SOPN Inject 1 mg as directed once a week. 05/22/21  Yes Vigg, Avanti, MD  simvastatin (ZOCOR) 40 MG tablet Take 1 tablet (40 mg total) by mouth daily. 04/23/21  Yes Vigg, Avanti, MD  ONE TOUCH ULTRA TEST test strip USE AS DIRECTED. NEED NEW INSURANCE. 05/03/15   Guadalupe Maple, MD    Allergies as of 05/24/2021 - Review Complete 05/23/2021  Allergen Reaction Noted   Penicillins  09/02/2014    Family History  Problem Relation Age of Onset   Hypertension Mother    Diabetes Mother    Hypertension Father    Cancer Father     Social History   Socioeconomic History   Marital status: Married    Spouse name: Not on file   Number of children: Not on file   Years of education: Not on file   Highest education level: Not on file  Occupational History   Not on file  Tobacco Use   Smoking status: Never   Smokeless tobacco: Never  Vaping Use   Vaping Use: Never used  Substance and Sexual Activity   Alcohol use: Yes    Comment: occasional   Drug use: No   Sexual activity: Not on file  Other Topics  Concern   Not on file  Social History Narrative   Not on file   Social Determinants of Health   Financial Resource Strain: Not on file  Food Insecurity: Not on file  Transportation Needs: Not on file  Physical Activity: Not on file  Stress: Not on file  Social Connections: Not on file  Intimate Partner Violence: Not on file    Review of Systems: See HPI, otherwise negative ROS  Physical Exam: BP (!) 154/105   Pulse 73   Resp 16   Ht '5\' 10"'$  (1.778 m)   Wt 99.8 kg   SpO2 100%   BMI 31.57 kg/m  General:   Alert,  pleasant and cooperative in NAD Head:  Normocephalic and atraumatic. Neck:  Supple; no masses or thyromegaly. Lungs:  Clear  throughout to auscultation.    Heart:  Regular rate and rhythm. Abdomen:  Soft, nontender and nondistended. Normal bowel sounds, without guarding, and without rebound.   Neurologic:  Alert and  oriented x4;  grossly normal neurologically.  Impression/Plan: Matthew Collier is here for an endoscopy and colonoscopy to be performed for History of Barrett's esophagus, short segment without any evidence of dysplasia as well as chronic GERD, Tubular adenomas of the colon  Risks, benefits, limitations, and alternatives regarding  endoscopy and colonoscopy have been reviewed with the patient.  Questions have been answered.  All parties agreeable.   Sherri Sear, MD  06/15/2021, 8:37 AM

## 2021-06-15 NOTE — Op Note (Signed)
[facility] Gastroenterology Patient Name: Matthew Collier Procedure Date: 06/15/2021 8:45 AM MRN: [id number] Account #: [id number] Date of Birth: 1965-12-06 Admit Type: Outpatient Age: 56 Room: [facility] ENDO ROOM 1 Gender: Male Note Status: Finalized Instrument Name: Michaelle Birks [id number] Procedure:             Upper GI endoscopy Indications:           Screening for Barrett's esophagus, Surveillance                         procedure, Surveillance for malignancy due to personal                         history of Barrett's esophagus Providers:             Lin Landsman MD, MD Referring MD:          Charlynne Cousins (Referring MD) Medicines:             General Anesthesia Complications:         No immediate complications. Estimated blood loss: None. Procedure:             Pre-Anesthesia Assessment:                        - Prior to the procedure, a History and Physical was                         performed, and patient medications and allergies were                         reviewed. The patient is competent. The risks and                         benefits of the procedure and the sedation options and                         risks were discussed with the patient. All questions                         were answered and informed consent was obtained.                         Patient identification and proposed procedure were                         verified by the physician, the nurse, the                         anesthesiologist, the anesthetist and the technician                         in the pre-procedure area in the procedure room in the                         endoscopy suite. Mental Status Examination: alert and                         oriented. Airway Examination: normal oropharyngeal  airway and neck mobility. Respiratory Examination:                         clear to auscultation. CV Examination: normal.                         Prophylactic  Antibiotics: The patient does not require                         prophylactic antibiotics. Prior Anticoagulants: The                         patient has taken no previous anticoagulant or                         antiplatelet agents. ASA Grade Assessment: II - A                         patient with mild systemic disease. After reviewing                         the risks and benefits, the patient was deemed in                         satisfactory condition to undergo the procedure. The                         anesthesia plan was to use general anesthesia.                         Immediately prior to administration of medications,                         the patient was re-assessed for adequacy to receive                         sedatives. The heart rate, respiratory rate, oxygen                         saturations, blood pressure, adequacy of pulmonary                         ventilation, and response to care were monitored                         throughout the procedure. The physical status of the                         patient was re-assessed after the procedure.                        After obtaining informed consent, the endoscope was                         passed under direct vision. Throughout the procedure,                         the patient's blood pressure, pulse, and oxygen  saturations were monitored continuously. The Endoscope                         was introduced through the mouth, and advanced to the                         second part of duodenum. The upper GI endoscopy was                         accomplished without difficulty. The patient tolerated                         the procedure well. Findings:      The duodenal bulb and second portion of the duodenum were normal.      The entire examined stomach was normal.      The cardia and gastric fundus were normal on retroflexion.      Esophagogastric landmarks were identified: the gastroesophageal  junction       was found at 38 cm from the incisors.      The esophagus and gastroesophageal junction were examined with white       light and narrow band imaging (NBI) from a forward view and retroflexed       position. There were esophageal mucosal changes secondary to established       short-segment Barrett's disease. These changes involved the mucosa at       the upper extent of the gastric folds (38 cm from the incisors)       extending to the Z-line (38 cm from the incisors). One tongue of       salmon-colored mucosa was present from 38 to 39 cm. The maximum       longitudinal extent of these esophageal mucosal changes was 1 cm in       length. Mucosa was biopsied with a cold forceps for histology. One       specimen bottle was sent to pathology.      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 Impression:            - Normal duodenal bulb and second portion of the                         duodenum.                        - Normal stomach.                        - Esophagogastric landmarks identified.                        - Esophageal mucosal changes secondary to established                         short-segment Barrett's disease. Biopsied.                        - Ectopic gastric mucosa in the upper third of the                         esophagus. Recommendation:        -  Follow an antireflux regimen indefinitely.                        - Continue present medications.                        - Proceed with colonoscopy as scheduled                        See colonoscopy report                        - Use Protonix (pantoprazole) 40 mg PO BID.                        - Repeat upper endoscopy in 5 years for surveillance                         of Barrett's esophagus. Procedure Code(s):     --- Professional ---                        [phone], Esophagogastroduodenoscopy, flexible,                         transoral; with biopsy, single or  multiple Diagnosis Code(s):     --- Professional ---                        K22.70, Barrett's esophagus without dysplasia                        Z13.810, Encounter for screening for upper                         gastrointestinal disorder CPT copyright 2019 [employer]. All rights reserved. The codes documented in this report are preliminary and upon coder review may  be revised to meet current compliance requirements. Dr. Ulyess Mort Lin Landsman MD, MD 06/15/2021 9:00:18 AM This report has been signed electronically. Number of Addenda: 0 Note Initiated On: 06/15/2021 8:45 AM Estimated Blood Loss:  Estimated blood loss: none.      [facility]

## 2021-06-15 NOTE — Transfer of Care (Signed)
Immediate Anesthesia Transfer of Care Note  Patient: Matthew Collier  Procedure(s) Performed: COLONOSCOPY WITH PROPOFOL ESOPHAGOGASTRODUODENOSCOPY (EGD) WITH PROPOFOL  Patient Location: PACU and Endoscopy Unit  Anesthesia Type:General  Level of Consciousness: drowsy and patient cooperative  Airway & Oxygen Therapy: Patient Spontanous Breathing and Patient connected to nasal cannula oxygen  Post-op Assessment: Report given to RN and Post -op Vital signs reviewed and stable  Post vital signs: Reviewed and stable  Last Vitals:  Vitals Value Taken Time  BP 107/68 06/15/21 0919  Temp    Pulse 79 06/15/21 0919  Resp 13 06/15/21 0919  SpO2 98 % 06/15/21 0919    Last Pain:  Vitals:   06/15/21 0919  TempSrc:   PainSc: Asleep         Complications: No notable events documented.

## 2021-06-15 NOTE — Op Note (Signed)
[facility] Gastroenterology Patient Name: Matthew Collier Procedure Date: 06/15/2021 8:44 AM MRN: [id number] Account #: [id number] Date of Birth: [birth year] Admit Type: Outpatient Age: 56 Room: [facility] ENDO ROOM 1 Gender: Male Note Status: Finalized Instrument Name: Park Meo [id number] Procedure:             Colonoscopy Indications:           Surveillance: Personal history of adenomatous polyps                         on last colonoscopy 3 years ago, Last colonoscopy:                         September 2019 Providers:             Lin Landsman MD, MD Referring MD:          Charlynne Cousins (Referring MD) Medicines:             General Anesthesia Complications:         No immediate complications. Estimated blood loss: None. Procedure:             Pre-Anesthesia Assessment:                        - Prior to the procedure, a History and Physical was                         performed, and patient medications and allergies were                         reviewed. The patient is competent. The risks and                         benefits of the procedure and the sedation options and                         risks were discussed with the patient. All questions                         were answered and informed consent was obtained.                         Patient identification and proposed procedure were                         verified by the physician, the nurse, the                         anesthesiologist, the anesthetist and the technician                         in the pre-procedure area in the procedure room in the                         endoscopy suite. Mental Status Examination: alert and                         oriented. Airway Examination: normal oropharyngeal  airway and neck mobility. Respiratory Examination:                         clear to auscultation. CV Examination: normal.                         Prophylactic Antibiotics: The patient does not  require                         prophylactic antibiotics. Prior Anticoagulants: The                         patient has taken no previous anticoagulant or                         antiplatelet agents. ASA Grade Assessment: II - A                         patient with mild systemic disease. After reviewing                         the risks and benefits, the patient was deemed in                         satisfactory condition to undergo the procedure. The                         anesthesia plan was to use general anesthesia.                         Immediately prior to administration of medications,                         the patient was re-assessed for adequacy to receive                         sedatives. The heart rate, respiratory rate, oxygen                         saturations, blood pressure, adequacy of pulmonary                         ventilation, and response to care were monitored                         throughout the procedure. The physical status of the                         patient was re-assessed after the procedure.                        After obtaining informed consent, the colonoscope was                         passed under direct vision. Throughout the procedure,                         the patient's blood pressure, pulse, and oxygen  saturations were monitored continuously. The                         Colonoscope was introduced through the anus and                         advanced to the the terminal ileum, with                         identification of the appendiceal orifice and IC                         valve. The colonoscopy was performed without                         difficulty. The patient tolerated the procedure well.                         The quality of the bowel preparation was adequate to                         identify polyps 6 mm and larger in size. Findings:      The perianal and digital rectal examinations were normal.  Pertinent       negatives include normal sphincter tone and no palpable rectal lesions.      The terminal ileum appeared normal.      A 5 mm polyp was found in the transverse colon. The polyp was sessile.       The polyp was removed with a cold snare. Resection and retrieval were       complete.      A 12 mm polyp was found in the proximal rectum. The polyp was       semi-pedunculated. The polyp was removed with a hot snare. Resection and       retrieval were complete. Estimated blood loss: none.      Multiple small and large-mouthed diverticula were found in the entire       colon. There was no evidence of diverticular bleeding.      Non-bleeding external hemorrhoids were found during retroflexion. The       hemorrhoids were medium-sized. Impression:            - The examined portion of the ileum was normal.                        - One 5 mm polyp in the transverse colon, removed with                         a cold snare. Resected and retrieved.                        - One 12 mm polyp in the proximal rectum, removed with                         a hot snare. Resected and retrieved.                        - Severe diverticulosis in the entire examined colon.  There was no evidence of diverticular bleeding.                        - Non-bleeding external hemorrhoids. Recommendation:        - Discharge patient to home (with escort).                        - Resume previous diet today.                        - Continue present medications.                        - Await pathology results.                        - Repeat colonoscopy in 3 - 5 years for surveillance                         based on pathology results. Procedure Code(s):     --- Professional ---                        [phone], Colonoscopy, flexible; with removal of                         tumor(s), polyp(s), or other lesion(s) by snare                         technique Diagnosis Code(s):     ---  Professional ---                        Z86.010, Personal history of colonic polyps                        K64.4, Residual hemorrhoidal skin tags                        K63.5, Polyp of colon                        K62.1, Rectal polyp                        K57.30, Diverticulosis of large intestine without                         perforation or abscess without bleeding CPT copyright 2019 American Medical Association. All rights reserved. The codes documented in this report are preliminary and upon coder review may  be revised to meet current compliance requirements. Dr. Ulyess Mort Lin Landsman MD, MD 06/15/2021 9:18:20 AM This report has been signed electronically. Number of Addenda: 0 Note Initiated On: 06/15/2021 8:44 AM Scope Withdrawal Time: 0 hours 10 minutes 42 seconds  Total Procedure Duration: 0 hours 13 minutes 58 seconds  Estimated Blood Loss:  Estimated blood loss: none.      [facility]

## 2021-06-15 NOTE — Anesthesia Postprocedure Evaluation (Signed)
Anesthesia Post Note  Patient: Matthew Collier  Procedure(s) Performed: COLONOSCOPY WITH PROPOFOL ESOPHAGOGASTRODUODENOSCOPY (EGD) WITH PROPOFOL  Patient location during evaluation: Endoscopy Anesthesia Type: General Level of consciousness: awake and alert Pain management: pain level controlled Vital Signs Assessment: post-procedure vital signs reviewed and stable Respiratory status: spontaneous breathing, nonlabored ventilation and respiratory function stable Cardiovascular status: blood pressure returned to baseline and stable Postop Assessment: no apparent nausea or vomiting Anesthetic complications: no   No notable events documented.   Last Vitals:  Vitals:   06/15/21 0939 06/15/21 0949  BP: 126/78 128/84  Pulse: 64 (!) 56  Resp: 15 14  SpO2: 99% 99%    Last Pain:  Vitals:   06/15/21 0949  TempSrc:   PainSc: 0-No pain                 [place] Ouch

## 2021-06-18 ENCOUNTER — Encounter: Payer: Self-pay | Admitting: Gastroenterology

## 2021-06-18 LAB — SURGICAL PATHOLOGY

## 2021-06-19 ENCOUNTER — Encounter: Payer: Self-pay | Admitting: Gastroenterology

## 2021-06-21 ENCOUNTER — Ambulatory Visit: Payer: BC Managed Care – PPO | Admitting: Internal Medicine

## 2021-06-28 ENCOUNTER — Encounter: Payer: Self-pay | Admitting: Internal Medicine

## 2021-06-28 ENCOUNTER — Ambulatory Visit: Payer: BC Managed Care – PPO | Admitting: Internal Medicine

## 2021-06-28 VITALS — BP 145/87 | HR 85 | Temp 98.9°F | Ht 70.0 in | Wt 219.8 lb

## 2021-06-28 DIAGNOSIS — E1169 Type 2 diabetes mellitus with other specified complication: Secondary | ICD-10-CM

## 2021-06-28 DIAGNOSIS — E669 Obesity, unspecified: Secondary | ICD-10-CM | POA: Diagnosis not present

## 2021-06-28 LAB — BAYER DCA HB A1C WAIVED: HB A1C (BAYER DCA - WAIVED): 8.2 % — ABNORMAL HIGH (ref 4.8–5.6)

## 2021-06-28 NOTE — Progress Notes (Signed)
BP (!) 145/87   Pulse 85   Temp 98.9 F (37.2 C) (Oral)   Ht '5\' 10"'$  (1.778 m)   Wt 219 lb 12.8 oz (99.7 kg)   SpO2 99%   BMI 31.54 kg/m    Subjective:    Patient ID: Matthew Collier, male    DOB: [birth year], 56 y.o.   MRN: [id number]  Chief Complaint  Patient presents with  . Diabetes    HPI: Matthew Collier is a 56 y.o. male  Diabetes He presents for his follow-up diabetic visit. He has type 2 diabetes mellitus.  Hypertension This is a chronic problem.    Chief Complaint  Patient presents with  . Diabetes    Relevant past medical, surgical, family and social history reviewed and updated as indicated. Interim medical history since our last visit reviewed. Allergies and medications reviewed and updated.  Review of Systems  Per HPI unless specifically indicated above     Objective:    BP (!) 145/87   Pulse 85   Temp 98.9 F (37.2 C) (Oral)   Ht '5\' 10"'$  (1.778 m)   Wt 219 lb 12.8 oz (99.7 kg)   SpO2 99%   BMI 31.54 kg/m   Wt Readings from Last 3 Encounters:  06/28/21 219 lb 12.8 oz (99.7 kg)  06/15/21 220 lb (99.8 kg)  05/23/21 220 lb 4 oz (99.9 kg)    Physical Exam Vitals and nursing note reviewed.  Constitutional:      General: He is not in acute distress.    Appearance: Normal appearance. He is not ill-appearing or diaphoretic.  HENT:     Head: Normocephalic and atraumatic.     Right Ear: Tympanic membrane and external ear normal. There is no impacted cerumen.     Left Ear: External ear normal.     Nose: No congestion or rhinorrhea.     Mouth/Throat:     Pharynx: No oropharyngeal exudate or posterior oropharyngeal erythema.  Eyes:     Conjunctiva/sclera: Conjunctivae normal.     Pupils: Pupils are equal, round, and reactive to light.  Cardiovascular:     Rate and Rhythm: Normal rate and regular rhythm.     Heart sounds: No murmur heard.    No friction rub. No gallop.  Pulmonary:     Effort: No respiratory distress.     Breath sounds: No stridor.  No wheezing or rhonchi.  Chest:     Chest wall: No tenderness.  Abdominal:     General: Abdomen is flat. Bowel sounds are normal.     Palpations: Abdomen is soft. There is no mass.     Tenderness: There is no abdominal tenderness.  Musculoskeletal:     Cervical back: Normal range of motion and neck supple. No rigidity or tenderness.     Left lower leg: No edema.  Skin:    General: Skin is warm and dry.  Neurological:     Mental Status: He is alert.    Results for orders placed or performed during the hospital encounter of 06/15/21  Surgical pathology  Result Value Ref Range   SURGICAL PATHOLOGY      SURGICAL PATHOLOGY CASE: [phone] PATIENT: [facility] Pathology Report     Specimen Submitted: A. Esophagus; cbx B. Colon polyp, transverse; cold snare C. Rectum polyp; hot snare  Clinical History: EGD Barrett's esophagus without dysplasia K22.70 colonoscopy Z86.010.  History of colon polyps. Barrett's esophagus, colon polyps, diverticulosis      DIAGNOSIS: A.  ESOPHAGUS; COLD BIOPSY: - REFLUX GASTROESOPHAGITIS. - NEGATIVE FOR INTESTINAL METAPLASIA, DYSPLASIA, AND MALIGNANCY.  B.  COLON POLYP, TRANSVERSE; COLD SNARE: - TUBULAR ADENOMA. - NEGATIVE FOR HIGH-GRADE DYSPLASIA AND MALIGNANCY.  C.  RECTUM POLYP; HOT SNARE: - HYPERPLASTIC POLYP. - NEGATIVE FOR DYSPLASIA AND MALIGNANCY.  GROSS DESCRIPTION: A. Labeled: Cbx esophagus history of Barrett's Received: Formalin Collection time: 8:51 AM on 06/15/2021 Placed into formalin time: 8:51 AM on 06/15/2021 Tissue fragment(s): 2 Size: Ranges from 0.3-0.4 cm Description: Pink soft tissue fragment s Entirely submitted in 1 cassette.  B. Labeled: Cold snare polyp transverse colon Received: Formalin Collection time: 9:07 AM on 06/15/2021 Placed into formalin time: 9:07 AM on 06/15/2021 Tissue fragment(s): Multiple Size: Aggregate, 0.6 x 0.2 x 0.1 cm Description: Tan soft tissue fragments Entirely submitted in  1 cassette.  C. Labeled: Hot snare polyp rectum Received: Formalin Collection time: 9:13 AM on 06/15/2021 Placed into formalin time: 9:13 AM on 06/15/2021 Tissue fragment(s): 1 Size: 1.0 x 0.8 x 0.6 cm Description: Received is a pink soft tissue fragment.  The resection margin is inked black, and the fragment is trisected Entirely submitted in 1 cassette.  CM 06/15/2021  Final Diagnosis performed by Quay Burow, MD.   Electronically signed 06/18/2021 1:17:34PM The electronic signature indicates that the named Attending Pathologist has evaluated the specimen Technical component performed at [facility], [address] [place], [place] [address] Lab: [phone] Dir : Rush Farmer, MD, MMM  Professional component performed at [facility], [facility], [place], [place],  [address] Lab: [phone] Dir: Kathi Simpers, MD         Current Outpatient Medications:  .  benazepril (LOTENSIN) 40 MG tablet, Take 1 tablet (40 mg total) by mouth daily., Disp: 90 tablet, Rfl: 4 .  ferrous sulfate 325 (65 FE) MG tablet, Take 1 tablet (325 mg total) by mouth 2 (two) times daily with a meal., Disp: 60 tablet, Rfl: 6 .  fexofenadine (ALLEGRA ALLERGY) 180 MG tablet, Take 1 tablet (180 mg total) by mouth daily., Disp: 10 tablet, Rfl: 1 .  Iron, Ferrous Sulfate, 325 (65 Fe) MG TABS, Take 325 mg by mouth in the morning and at bedtime., Disp: 60 tablet, Rfl: 1 .  metFORMIN (GLUCOPHAGE) 500 MG tablet, TAKE 1 TABLET BY MOUTH ONCE EVERY MORNING  AT NOON AND AT BEDTIME, Disp: 90 tablet, Rfl: 1 .  ONE TOUCH ULTRA TEST test strip, USE AS DIRECTED. NEED NEW INSURANCE., Disp: 100 each, Rfl: 12 .  pantoprazole (PROTONIX) 40 MG tablet, Take 1 tablet (40 mg total) by mouth 2 (two) times daily before a meal., Disp: 180 tablet, Rfl: 3 .  Semaglutide, 1 MG/DOSE, 4 MG/3ML SOPN, Inject 1 mg as directed once a week., Disp: 3 mL, Rfl: 6 .  simvastatin (ZOCOR) 40 MG tablet, Take 1 tablet (40 mg  total) by mouth daily., Disp: 90 tablet, Rfl: 4    Assessment & Plan:  DM : is on ozempic for such 1 mg / q weekly.  check HbA1c,  urine  microalbumin  diabetic diet plan given to pt  adviced regarding hypoglycemia and instructions given to pt today on how to prevent and treat the same if it were to occur. pt acknowledges the plan and voices understanding of the same.  exercise plan given and encouraged.   advice diabetic yearly podiatry, ophthalmology , nutritionist , dental check q 6 months,   2. HTN Will need to start pt on benazapril 40 mg  Continue current meds.  Medication compliance emphasised. pt advised to keep Bp logs. Pt verbalised understanding of the same. Pt to have a low salt diet . Exercise to reach a goal of at least 150 mins a week.  lifestyle modifications explained and pt understands importance of the above. Under good control on current regimen. Continue current regimen. Continue to monitor. Call with any concerns. Refills given. Labs drawn today.   3. HLD is on simvastain 40 mg. recheck FLP, check LFT's work on diet, SE of meds explained to pt. low fat and high fiber diet explained to pt.  Latest Reference Range & Units 05/14/21 09:48  Total CHOL/HDL Ratio 0.0 - 5.0 ratio 3.5  Cholesterol, Total 100 - 199 mg/dL 137  HDL Cholesterol >39 mg/dL 39 (L)  Triglycerides 0 - 149 mg/dL 186 (H)  VLDL Cholesterol Cal 5 - 40 mg/dL 31  LDL Chol Calc (NIH) 0 - 99 mg/dL 67  (L): Data is abnormally low (H): Data is abnormally high  4. Gi bleed s/ p cscope 2 weeks ago wnl, 2 polyps wwere taken out had 13 polyps 3 yrs ago and is on a 3-5 yrs ago.   Problem List Items Addressed This Visit       Endocrine   Type 2 diabetes mellitus with obesity ([place]) - Primary   Relevant Orders   Bayer DCA Hb A1c Waived     Orders Placed This Encounter  Procedures  . Bayer DCA Hb A1c Waived     No orders of the defined types were placed in this encounter.    Follow up plan: No  follow-ups on file.

## 2021-07-05 NOTE — Addendum Note (Signed)
Addended by: Charlynne Cousins on: 07/05/2021 12:54 PM   Modules accepted: Level of Service

## 2021-09-27 ENCOUNTER — Other Ambulatory Visit: Payer: Self-pay

## 2021-09-27 MED ORDER — METFORMIN HCL 500 MG PO TABS
ORAL_TABLET | ORAL | 1 refills | Status: DC
Start: 2021-09-27 — End: 2022-01-15

## 2021-09-27 NOTE — Telephone Encounter (Signed)
Medication refill for Metforming HCL 500 mg last ov 06/28/21, upcoming ov 10/02/21 . Please advise

## 2021-10-02 ENCOUNTER — Telehealth (INDEPENDENT_AMBULATORY_CARE_PROVIDER_SITE_OTHER): Payer: BC Managed Care – PPO | Admitting: Physician Assistant

## 2021-10-02 ENCOUNTER — Encounter: Payer: Self-pay | Admitting: Physician Assistant

## 2021-10-02 ENCOUNTER — Other Ambulatory Visit: Payer: BC Managed Care – PPO

## 2021-10-02 DIAGNOSIS — E785 Hyperlipidemia, unspecified: Secondary | ICD-10-CM | POA: Diagnosis not present

## 2021-10-02 DIAGNOSIS — E1169 Type 2 diabetes mellitus with other specified complication: Secondary | ICD-10-CM | POA: Diagnosis not present

## 2021-10-02 DIAGNOSIS — I10 Essential (primary) hypertension: Secondary | ICD-10-CM

## 2021-10-02 DIAGNOSIS — E669 Obesity, unspecified: Secondary | ICD-10-CM

## 2021-10-02 LAB — MICROALBUMIN, URINE WAIVED
Creatinine, Urine Waived: 50 mg/dL (ref 10–300)
Microalb, Ur Waived: 30 mg/L — ABNORMAL HIGH (ref 0–19)

## 2021-10-02 NOTE — Progress Notes (Signed)
Virtual Visit via Video Note  I connected with Matthew Collier on 10/04/21 at  2:20 PM EDT by a video enabled telemedicine application and verified that I am speaking with the correct person using two identifiers.   Today's Provider: Talitha Givens, MHS, PA-C Introduced myself to the patient as a PA-C and provided education on APPs in clinical practice.   Location: Patient: at home, [facility], [place]  Provider: at home, [place], [place]    I discussed the limitations of evaluation and management by telemedicine and the availability of in person appointments. The patient expressed understanding and agreed to proceed.   Chief Complaint  Patient presents with   Diabetes     History of Present Illness:  Diabetes, Type 2 - Last A1c 8.2 - Medications: Metformin 500 mg PO BID, Semaglutide 1 mg IM per week,  - Compliance:  - Checking BG at home: He is checking at home - usually running around 180-200. Checking once a week, usually checking in the evening  - Diet: States he is trying to eat more vegetables, has tried to cut down on red meat.  - Exercise: he is active at work, not engaged in regular exercise  - Eye exam: Discussed this with patient and importance of screening for diabetic retinopathy  - Foot exam: will do at next visit  - Microalbumin: orders placed today - Statin: On simvastatin  - PNA vaccine: not indicated yet - Denies symptoms of hypoglycemia, polyuria, polydipsia, numbness extremities, foot ulcers/trauma Reports reduced appetite since starting Ozempic - states he has some nausea when he takes it and reports increased bowel movements    HYPERTENSION / [place] Satisfied with current treatment? yes Duration of hypertension: chronic BP monitoring frequency: not checking BP range:  Unsure of what it runs at this time  BP medication side effects: no Past BP meds: benazepril Duration of hyperlipidemia: chronic Cholesterol medication side effects: no Cholesterol  supplements: none Past cholesterol medications: simvastatin (zocor) Medication compliance: good compliance Aspirin: no Recent stressors: no Recurrent headaches: no Visual changes: no Palpitations: no Dyspnea: no Chest pain: no Lower extremity edema: no Dizzy/lightheaded: no    Review of Systems  Eyes:  Positive for blurred vision. Negative for double vision.  Respiratory:  Negative for cough, shortness of breath and wheezing.   Cardiovascular:  Negative for chest pain, palpitations and leg swelling.  Gastrointestinal:  Positive for nausea. Negative for heartburn.  Neurological:  Negative for dizziness and headaches.      Observations/Objective:  Due to the nature of the virtual visit, physical exam and observations are limited. Able to obtain the following observations:   Alert, oriented, Appears comfortable, in no acute distress.  No scleral injection, no appreciated hoarseness, tachypnea, wheeze or strider. Able to maintain conversation without visible strain.  No cough appreciated during visit.     Assessment and Plan:  Problem List Items Addressed This Visit       Cardiovascular and Mediastinum   Essential hypertension - Primary    Chronic, historic condition Currently taking benazepril 40 mg PO QD and appears to be tolerating well Will recheck at next in-person visit as patient does not monitor BP at home Continue current medications Follow up in 3 months or sooner if concerns arise      Relevant Orders   Comp Met (CMET)   CBC w/Diff     Endocrine   Type 2 diabetes mellitus with obesity ([place])    Chronic, ongoing condition He is currently  taking metformin 500 mg PO BID and Semaglutide 1 mg injection per week- appears to be tolerating well Reports he is checking blood glucose about once per week in the evenings- usually 180-200 Encouraged him to continue diet and incorporate exercise into daily schedule Recheck A1c and labs today -results to guide  further management Follow up in 3 months pending labs or concerns before that.       Relevant Orders   Comp Met (CMET)   HgB A1c   Microalbumin, Urine Waived (STAT)   Hyperlipidemia associated with type 2 diabetes mellitus (HCC)    Chronic, historic condition  Currently taking Simvastatin 40 mg PO QD Will recheck lipid panel today - results to dictate further management Continue current medications Recommend he continue with dietary changes and increase daily exercise to provide further control Follow up in 3 months pending results or concerns       Relevant Orders   Lipid Profile   Follow Up Instructions:    I discussed the assessment and treatment plan with the patient. The patient was provided an opportunity to ask questions and all were answered. The patient agreed with the plan and demonstrated an understanding of the instructions.   The patient was advised to call back or seek an in-person evaluation if the symptoms worsen or if the condition fails to improve as anticipated.  I provided 13 minutes of non-face-to-face time during this encounter.   Return in about 3 months (around 01/01/2022) for Diabetes follow up, HTN.   I, Claud Gowan E Nyko Gell, PA-C, have reviewed all documentation for this visit. The documentation on 10/04/21 for the exam, diagnosis, procedures, and orders are all accurate and complete.   Talitha Givens, MHS, PA-C [facility]

## 2021-10-03 LAB — CBC WITH DIFFERENTIAL/PLATELET
Basophils Absolute: 0.1 10*3/uL (ref 0.0–0.2)
Basos: 1 %
EOS (ABSOLUTE): 0.1 10*3/uL (ref 0.0–0.4)
Eos: 1 %
Hematocrit: 50 % (ref 37.5–51.0)
Hemoglobin: 17.7 g/dL (ref 13.0–17.7)
Immature Grans (Abs): 0 10*3/uL (ref 0.0–0.1)
Immature Granulocytes: 0 %
Lymphocytes Absolute: 1.7 10*3/uL (ref 0.7–3.1)
Lymphs: 24 %
MCH: 32.2 pg (ref 26.6–33.0)
MCHC: 35.4 g/dL (ref 31.5–35.7)
MCV: 91 fL (ref 79–97)
Monocytes Absolute: 0.6 10*3/uL (ref 0.1–0.9)
Monocytes: 8 %
Neutrophils Absolute: 4.5 10*3/uL (ref 1.4–7.0)
Neutrophils: 66 %
Platelets: 208 10*3/uL (ref 150–450)
RBC: 5.49 x10E6/uL (ref 4.14–5.80)
RDW: 13 % (ref 11.6–15.4)
WBC: 6.9 10*3/uL (ref 3.4–10.8)

## 2021-10-03 LAB — HEMOGLOBIN A1C
Est. average glucose Bld gHb Est-mCnc: 263 mg/dL
Hgb A1c MFr Bld: 10.8 % — ABNORMAL HIGH (ref 4.8–5.6)

## 2021-10-03 LAB — LIPID PANEL
Chol/HDL Ratio: 4 ratio (ref 0.0–5.0)
Cholesterol, Total: 145 mg/dL (ref 100–199)
HDL: 36 mg/dL — ABNORMAL LOW (ref >39)
LDL Chol Calc (NIH): 55 mg/dL (ref 0–99)
Triglycerides: 351 mg/dL — ABNORMAL HIGH (ref 0–149)
VLDL Cholesterol Cal: 54 mg/dL — ABNORMAL HIGH (ref 5–40)

## 2021-10-03 LAB — COMPREHENSIVE METABOLIC PANEL
ALT: 37 IU/L (ref 0–44)
AST: 26 IU/L (ref 0–40)
Albumin/Globulin Ratio: 2.3 — ABNORMAL HIGH (ref 1.2–2.2)
Albumin: 5 g/dL — ABNORMAL HIGH (ref 3.8–4.9)
Alkaline Phosphatase: 69 IU/L (ref 44–121)
BUN/Creatinine Ratio: 17 (ref 9–20)
BUN: 14 mg/dL (ref 6–24)
Bilirubin Total: 0.7 mg/dL (ref 0.0–1.2)
CO2: 23 mmol/L (ref 20–29)
Calcium: 9.8 mg/dL (ref 8.7–10.2)
Chloride: 97 mmol/L (ref 96–106)
Creatinine, Ser: 0.84 mg/dL (ref 0.76–1.27)
Globulin, Total: 2.2 g/dL (ref 1.5–4.5)
Glucose: 241 mg/dL — ABNORMAL HIGH (ref 70–99)
Potassium: 4.2 mmol/L (ref 3.5–5.2)
Sodium: 139 mmol/L (ref 134–144)
Total Protein: 7.2 g/dL (ref 6.0–8.5)
eGFR: 101 mL/min/{1.73_m2} (ref >59)

## 2021-10-04 NOTE — Assessment & Plan Note (Signed)
Chronic, historic condition Currently taking benazepril 40 mg PO QD and appears to be tolerating well Will recheck at next in-person visit as patient does not monitor BP at home Continue current medications Follow up in 3 months or sooner if concerns arise

## 2021-10-04 NOTE — Assessment & Plan Note (Signed)
Chronic, ongoing condition He is currently taking metformin 500 mg PO BID and Semaglutide 1 mg injection per week- appears to be tolerating well Reports he is checking blood glucose about once per week in the evenings- usually 180-200 Encouraged him to continue diet and incorporate exercise into daily schedule Recheck A1c and labs today -results to guide further management Follow up in 3 months pending labs or concerns before that.

## 2021-10-04 NOTE — Assessment & Plan Note (Signed)
Chronic, historic condition  Currently taking Simvastatin 40 mg PO QD Will recheck lipid panel today - results to dictate further management Continue current medications Recommend he continue with dietary changes and increase daily exercise to provide further control Follow up in 3 months pending results or concerns

## 2021-11-05 ENCOUNTER — Encounter (INDEPENDENT_AMBULATORY_CARE_PROVIDER_SITE_OTHER): Payer: Self-pay

## 2021-11-26 ENCOUNTER — Ambulatory Visit: Payer: BC Managed Care – PPO | Admitting: Gastroenterology

## 2022-01-15 ENCOUNTER — Other Ambulatory Visit: Payer: Self-pay | Admitting: Nurse Practitioner

## 2022-02-16 IMAGING — CR DG HIP (WITH OR WITHOUT PELVIS) 2-3V*L*
1 series · 3 of 3 positions shown · non-contrast
Comparison: None.

CLINICAL DATA: Acute left hip pain without known injury.

EXAM:
DG HIP (WITH OR WITHOUT PELVIS) 2-3V LEFT

[Series 1: dg hip unilat w or w/o pelvis 2-3 views  · non-contrast · 0.14mm/px · 3 of 3 slices shown]
[im 1/3]
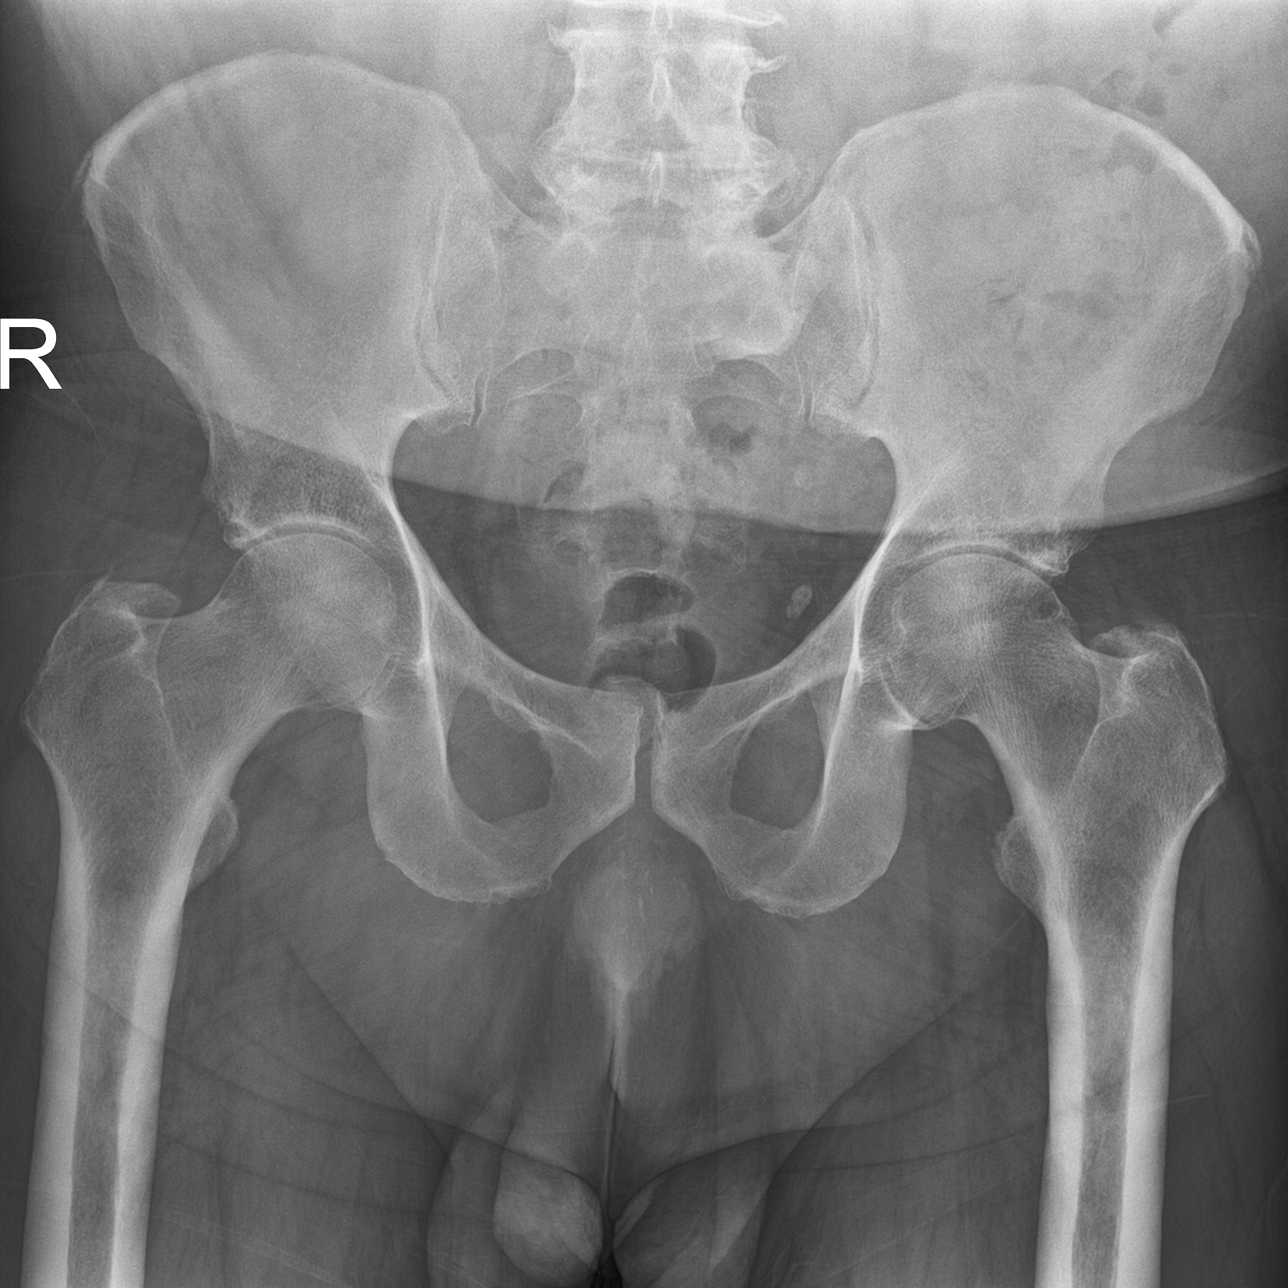
[im 2/3]
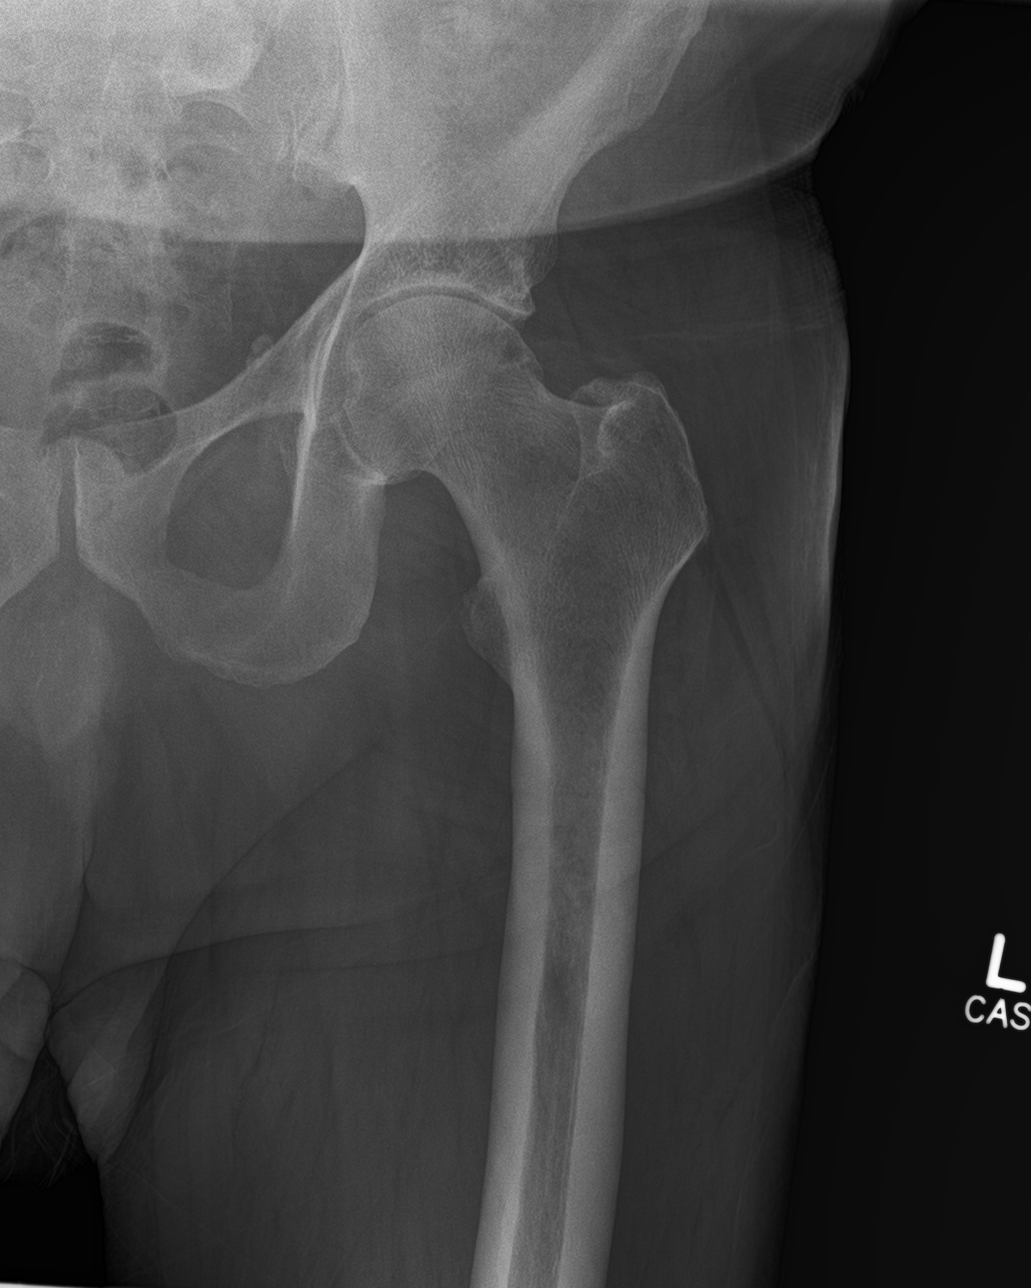
[im 3/3]
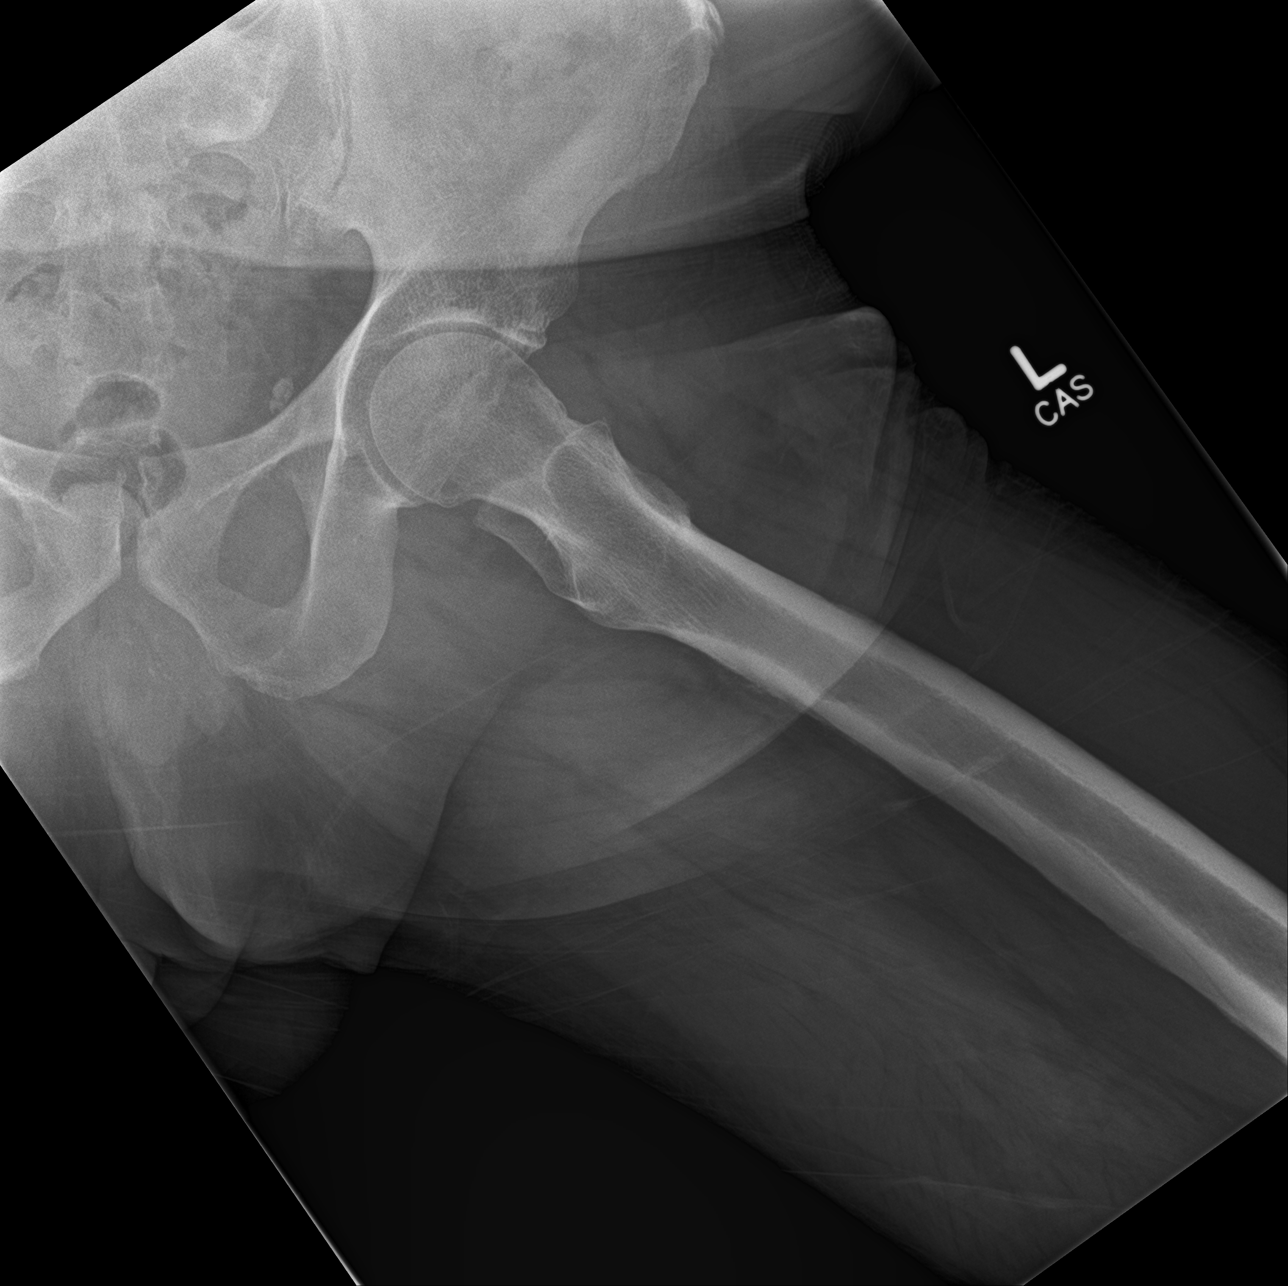

[3 of 3 positions shown; findings below may reference images not displayed]

FINDINGS: There is no evidence of hip fracture or dislocation. There is no
evidence of arthropathy or other focal bone abnormality.
IMPRESSION: Negative.

## 2022-02-16 IMAGING — CR DG LUMBAR SPINE COMPLETE 4+V
1 series · 5 of 5 positions shown · non-contrast
Comparison: CT 08/11/2017.  Lumbar spine 05/20/2011.

CLINICAL DATA: Left-sided low back pain.

EXAM:
LUMBAR SPINE - COMPLETE 4+ VIEW

[Series 1: dg lumbar spine complete 4 +v · 0.14mm/px · 5 of 5 slices shown]
[im 1/5]
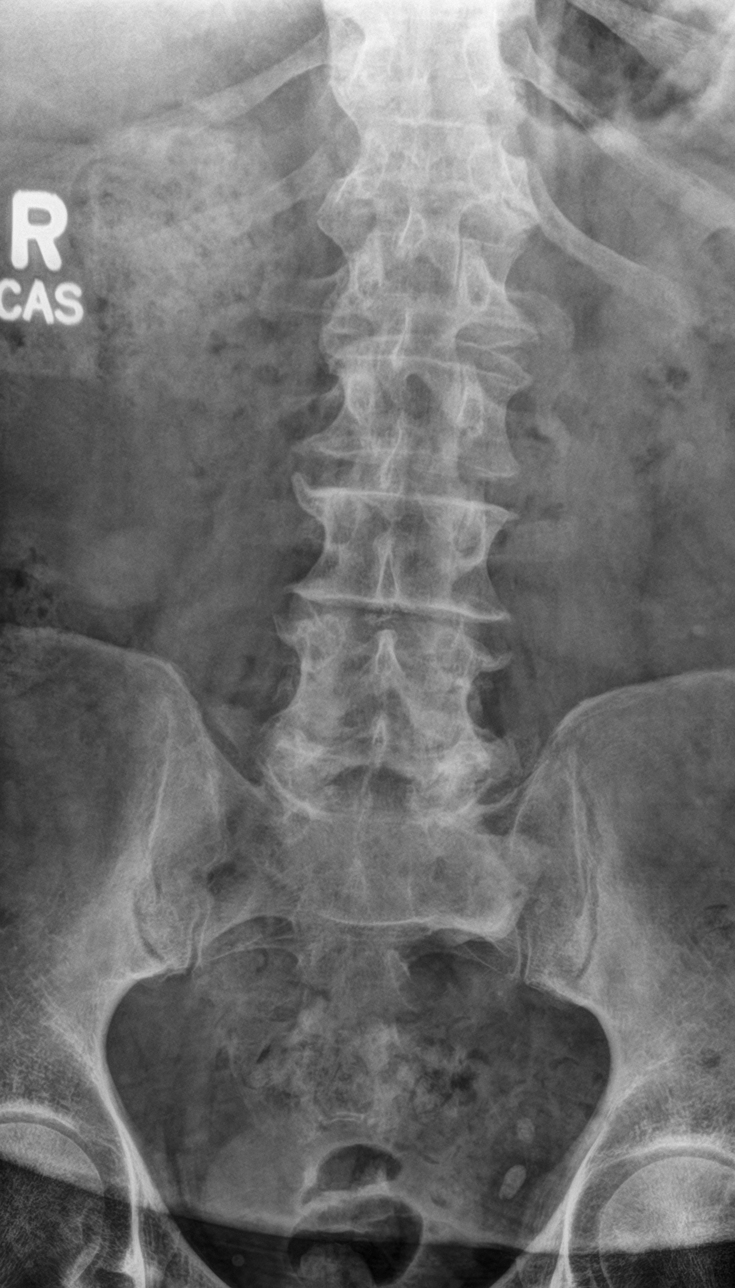
[im 2/5]
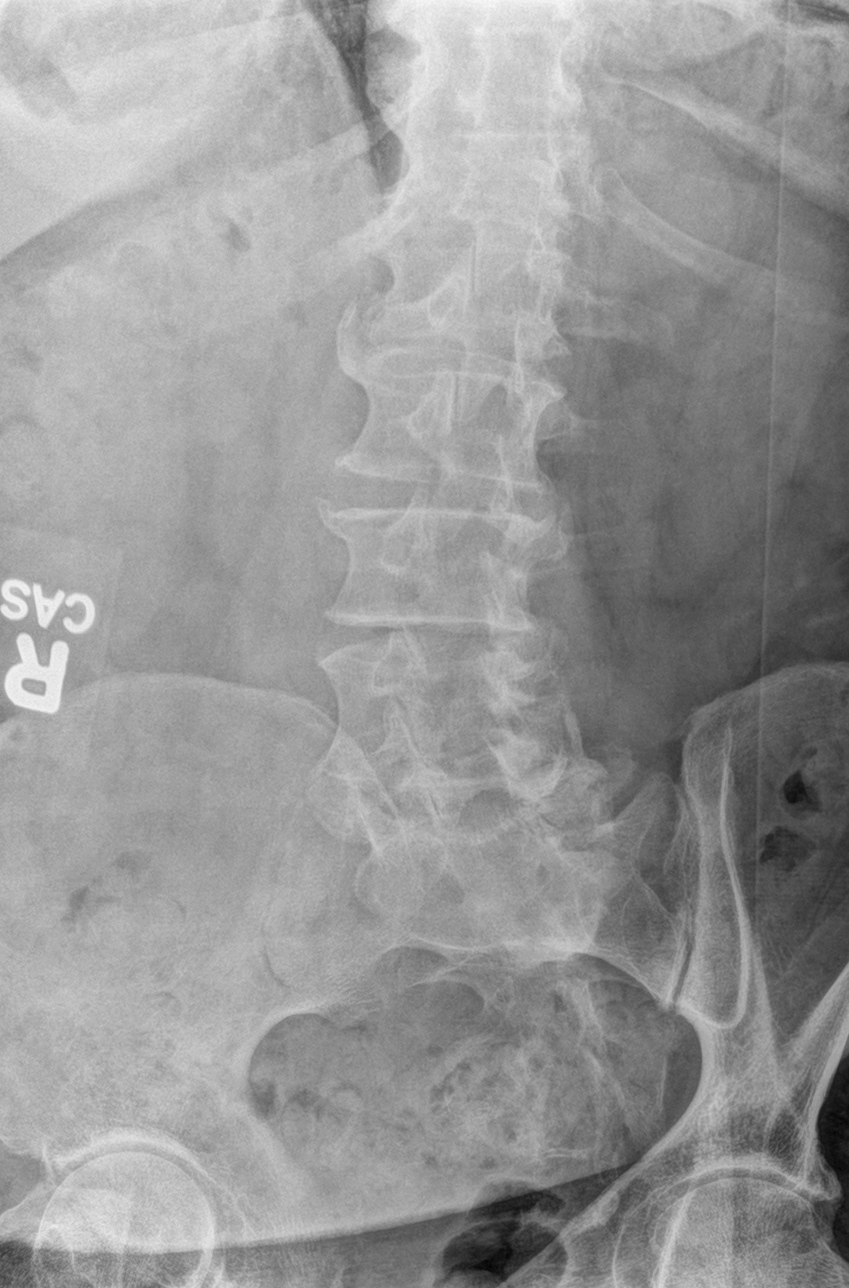
[im 3/5]
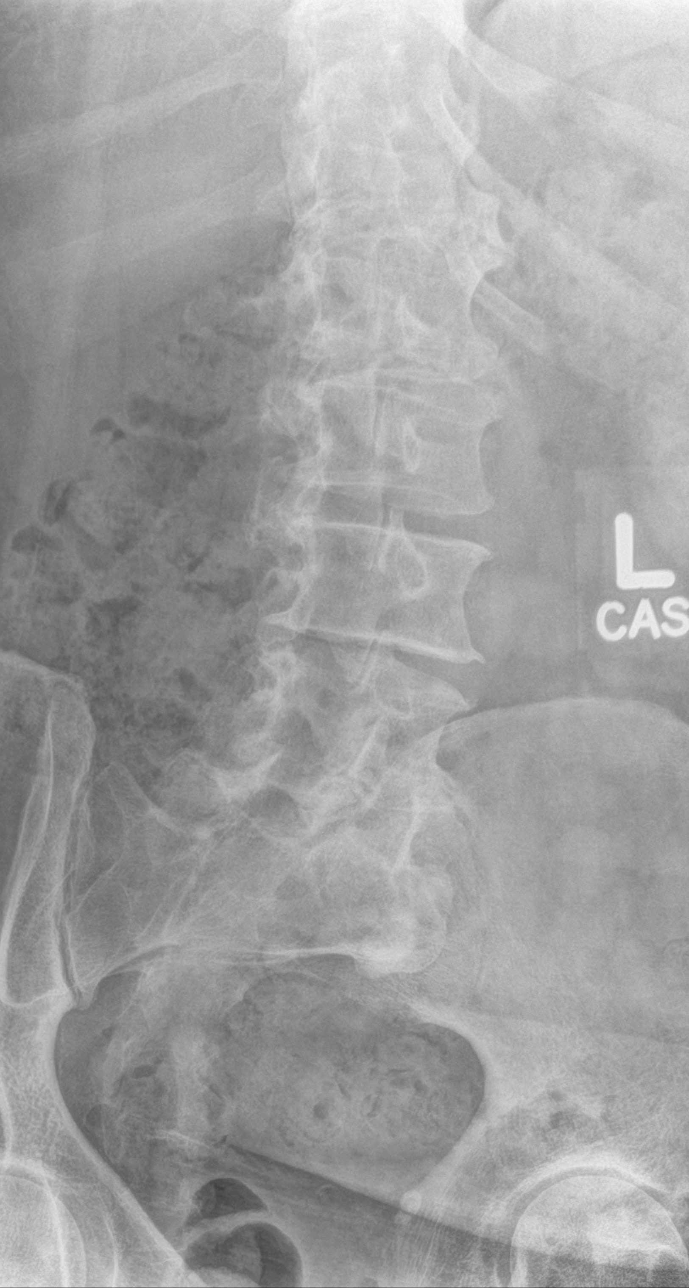
[im 4/5]
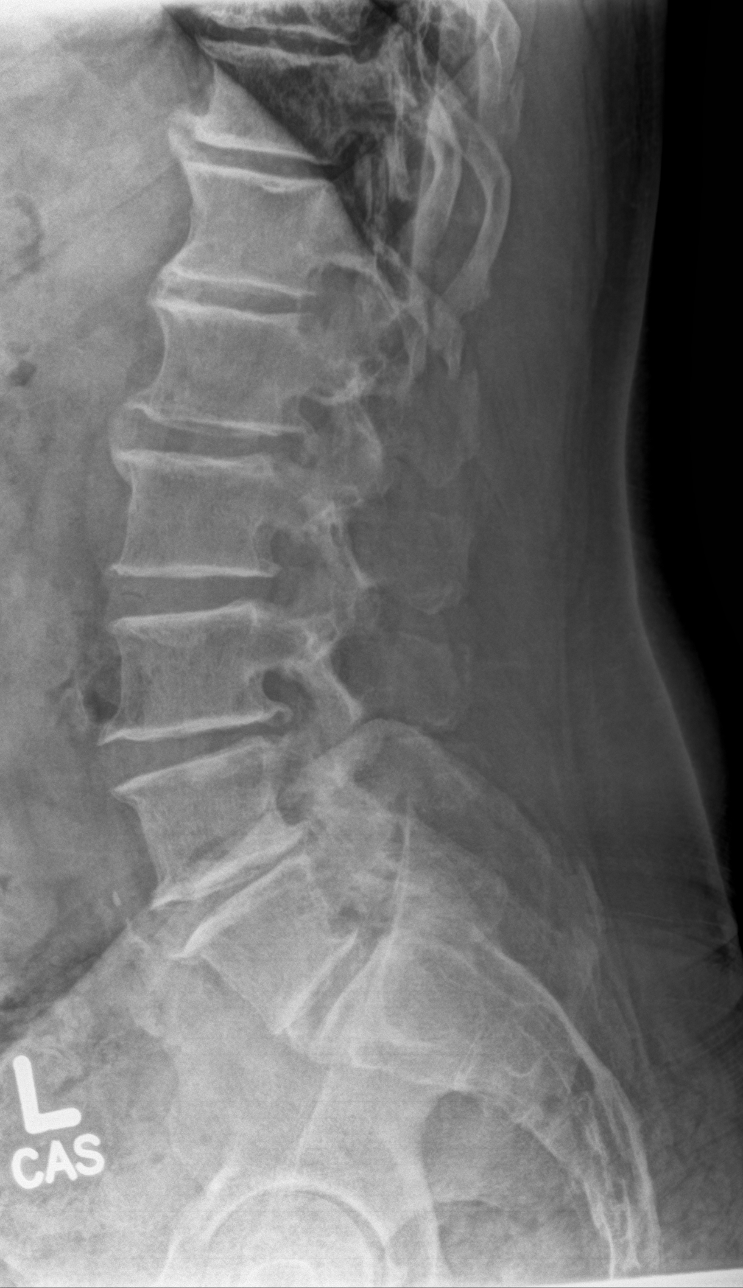
[im 5/5]
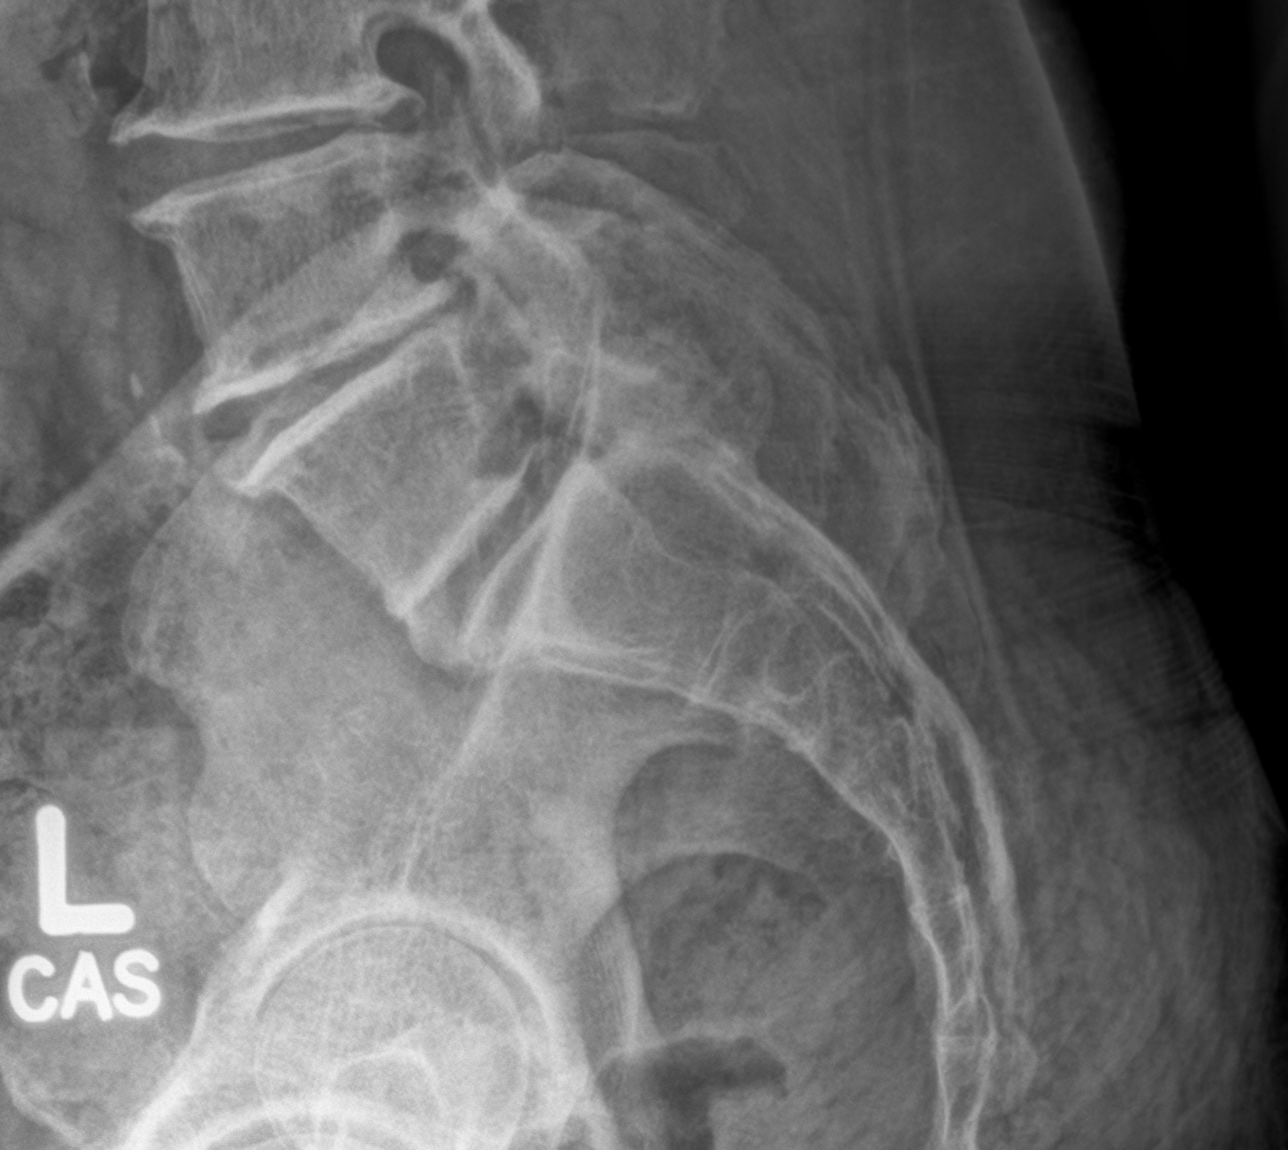

[5 of 5 positions shown; findings below may reference images not displayed]

FINDINGS: Paraspinal soft tissues are unremarkable. Severe multilevel
degenerative change with multilevel disc degeneration and endplate
osteophyte formation. Diffuse facet hypertrophy. No acute bony
abnormality identified. No evidence of fracture. Aortoiliac
atherosclerotic vascular calcification. Pelvic calcifications
consistent phleboliths. Large amount of stool noted throughout the
colon suggesting constipation.
IMPRESSION: 1. Diffuse severe multilevel degenerative change. No acute
abnormality identified.

2. Large amount of stool noted throughout the colon suggesting
constipation.

## 2022-06-20 ENCOUNTER — Other Ambulatory Visit: Payer: Self-pay | Admitting: Nurse Practitioner

## 2022-06-20 NOTE — Telephone Encounter (Signed)
Requested medication (s) are due for refill today:   Yes  Requested medication (s) are on the active medication list:   Yes  Future visit scheduled:   No     Former pt of Dr. Charlotta Newton.    Saw Erin Mecum, PA-C last and did not follow up in Dec. 2023 as instructed   Last ordered: 01/15/2022 #90, 1 refill  Returned due to missed f/u appt in Dec. And former pt of Dr. Charlotta Newton.      Requested Prescriptions  Pending Prescriptions Disp Refills   metFORMIN (GLUCOPHAGE) 500 MG tablet [Pharmacy Med Name: METFORMIN HCL 500 MG TAB] 90 tablet 1    Sig: TAKE 1 TABLET BY MOUTH ONCE EVERY MORNING AT NOON AND AT BEDTIME     Endocrinology:  Diabetes - Biguanides Failed - 06/20/2022  8:25 AM      Failed - HBA1C is between 0 and 7.9 and within 180 days    HB A1C (BAYER DCA - WAIVED)  Date Value Ref Range Status  06/28/2021 8.2 (H) 4.8 - 5.6 % Final    Comment:             Prediabetes: 5.7 - 6.4          Diabetes: >6.4          Glycemic control for adults with diabetes: <7.0    Hgb A1c MFr Bld  Date Value Ref Range Status  10/02/2021 10.8 (H) 4.8 - 5.6 % Final    Comment:             Prediabetes: 5.7 - 6.4          Diabetes: >6.4          Glycemic control for adults with diabetes: <7.0          Failed - Valid encounter within last 6 months    Recent Outpatient Visits           8 months ago Essential hypertension   [place] [facility] Mecum, Erin E, PA-C   11 months ago Type 2 diabetes mellitus with obesity (HCC)   [place] [facility] Vigg, Avanti, MD   1 year ago Gastrointestinal hemorrhage associated with anorectal source   [place] [facility] Vigg, Avanti, MD   1 year ago Upper respiratory tract infection, unspecified type   [place] [facility] Vigg, Avanti, MD   1 year ago Hypertension associated with diabetes (HCC)   [place] [facility] Vigg, Avanti, MD              Passed - Cr in normal range and  within 360 days    Creatinine, Ser  Date Value Ref Range Status  10/02/2021 0.84 0.76 - 1.27 mg/dL Final         Passed - eGFR in normal range and within 360 days    GFR calc Af Amer  Date Value Ref Range Status  01/04/2020 117 >59 mL/min/1.73 Final    Comment:    **In accordance with recommendations from the NKF-ASN Task force,**   [facility] is in the process of updating its eGFR calculation to the   2021 CKD-EPI creatinine equation that estimates kidney function   without a race variable.    GFR, Estimated  Date Value Ref Range Status  05/18/2021 >60 >60 mL/min Final    Comment:    (NOTE) Calculated using the CKD-EPI Creatinine Equation (2021)    eGFR  Date Value Ref Range  Status  10/02/2021 101 >59 mL/min/1.73 Final         Passed - B12 Level in normal range and within 720 days    Vitamin B-12  Date Value Ref Range Status  05/23/2021 342 232 - 1,245 pg/mL Final         Passed - CBC within normal limits and completed in the last 12 months    WBC  Date Value Ref Range Status  10/02/2021 6.9 3.4 - 10.8 x10E3/uL Final  05/18/2021 4.9 4.0 - 10.5 K/uL Final   RBC  Date Value Ref Range Status  10/02/2021 5.49 4.14 - 5.80 x10E6/uL Final  05/18/2021 3.01 (L) 4.22 - 5.81 MIL/uL Final   Hemoglobin  Date Value Ref Range Status  10/02/2021 17.7 13.0 - 17.7 g/dL Final   Hematocrit  Date Value Ref Range Status  10/02/2021 50.0 37.5 - 51.0 % Final   MCHC  Date Value Ref Range Status  10/02/2021 35.4 31.5 - 35.7 g/dL Final  53/66/4403 47.4 30.0 - 36.0 g/dL Final   [facility]  Date Value Ref Range Status  10/02/2021 32.2 26.6 - 33.0 pg Final  05/18/2021 32.2 26.0 - 34.0 pg Final   MCV  Date Value Ref Range Status  10/02/2021 91 79 - 97 fL Final   No results found for: "PLTCOUNTKUC", "LABPLAT", "POCPLA" RDW  Date Value Ref Range Status  10/02/2021 13.0 11.6 - 15.4 % Final

## 2022-06-20 NOTE — Telephone Encounter (Signed)
Patient has not been seen in a year. Needs appointment for any further refills.

## 2022-06-21 ENCOUNTER — Other Ambulatory Visit: Payer: Self-pay | Admitting: Nurse Practitioner

## 2022-06-21 NOTE — Telephone Encounter (Signed)
Attempted to reach patient and LVM detail message stating in order for patient to receive further refills, patient needs an appointment.   Put in CRM.

## 2022-06-21 NOTE — Telephone Encounter (Signed)
Requested medication (s) are due for refill today - soon-yes  Requested medication (s) are on the active medication list -yes  Future visit scheduled -no  Last refill: 01/15/22 #90 1RF  Notes to clinic: Attempted to call patient to schedule f/u appointment- left message to call office for appointment- Rx denied yesterday- needs appointment  Requested Prescriptions  Pending Prescriptions Disp Refills   metFORMIN (GLUCOPHAGE) 500 MG tablet [Pharmacy Med Name: METFORMIN HCL 500 MG TAB] 90 tablet 1    Sig: TAKE 1 TABLET BY MOUTH ONCE EVERY MORNING AT NOON AND AT BEDTIME     Endocrinology:  Diabetes - Biguanides Failed - 06/21/2022  8:14 AM      Failed - HBA1C is between 0 and 7.9 and within 180 days    HB A1C (BAYER DCA - WAIVED)  Date Value Ref Range Status  06/28/2021 8.2 (H) 4.8 - 5.6 % Final    Comment:             Prediabetes: 5.7 - 6.4          Diabetes: >6.4          Glycemic control for adults with diabetes: <7.0    Hgb A1c MFr Bld  Date Value Ref Range Status  10/02/2021 10.8 (H) 4.8 - 5.6 % Final    Comment:             Prediabetes: 5.7 - 6.4          Diabetes: >6.4          Glycemic control for adults with diabetes: <7.0          Failed - Valid encounter within last 6 months    Recent Outpatient Visits           8 months ago Essential hypertension   [place] [facility] Collier, Matthew E, PA-C   11 months ago Type 2 diabetes mellitus with obesity (HCC)   [place] [facility] Collier, Avanti, MD   1 year ago Gastrointestinal hemorrhage associated with anorectal source   [place] [facility] Collier, Avanti, MD   1 year ago Upper respiratory tract infection, unspecified type   [place] [facility] Collier, Avanti, MD   1 year ago Hypertension associated with diabetes (HCC)   [place] [facility] Collier, Avanti, MD              Passed - Cr in normal range and within 360 days    Creatinine, Ser   Date Value Ref Range Status  10/02/2021 0.84 0.76 - 1.27 mg/dL Final         Passed - eGFR in normal range and within 360 days    GFR calc Af Amer  Date Value Ref Range Status  01/04/2020 117 >59 mL/min/1.73 Final    Comment:    **In accordance with recommendations from the NKF-ASN Task force,**   [facility] is in the process of updating its eGFR calculation to the   2021 CKD-EPI creatinine equation that estimates kidney function   without a race variable.    GFR, Estimated  Date Value Ref Range Status  05/18/2021 >60 >60 mL/min Final    Comment:    (NOTE) Calculated using the CKD-EPI Creatinine Equation (2021)    eGFR  Date Value Ref Range Status  10/02/2021 101 >59 mL/min/1.73 Final         Passed - B12 Level in normal range and within 720 days  Vitamin B-12  Date Value Ref Range Status  05/23/2021 342 232 - 1,245 pg/mL Final         Passed - CBC within normal limits and completed in the last 12 months    WBC  Date Value Ref Range Status  10/02/2021 6.9 3.4 - 10.8 x10E3/uL Final  05/18/2021 4.9 4.0 - 10.5 K/uL Final   RBC  Date Value Ref Range Status  10/02/2021 5.49 4.14 - 5.80 x10E6/uL Final  05/18/2021 3.01 (L) 4.22 - 5.81 MIL/uL Final   Hemoglobin  Date Value Ref Range Status  10/02/2021 17.7 13.0 - 17.7 g/dL Final   Hematocrit  Date Value Ref Range Status  10/02/2021 50.0 37.5 - 51.0 % Final   MCHC  Date Value Ref Range Status  10/02/2021 35.4 31.5 - 35.7 g/dL Final  91/47/8295 62.1 30.0 - 36.0 g/dL Final   [facility]  Date Value Ref Range Status  10/02/2021 32.2 26.6 - 33.0 pg Final  05/18/2021 32.2 26.0 - 34.0 pg Final   MCV  Date Value Ref Range Status  10/02/2021 91 79 - 97 fL Final   No results found for: "PLTCOUNTKUC", "LABPLAT", "POCPLA" RDW  Date Value Ref Range Status  10/02/2021 13.0 11.6 - 15.4 % Final            Requested Prescriptions  Pending Prescriptions Disp Refills   metFORMIN (GLUCOPHAGE) 500 MG tablet [Pharmacy Med  Name: METFORMIN HCL 500 MG TAB] 90 tablet 1    Sig: TAKE 1 TABLET BY MOUTH ONCE EVERY MORNING AT NOON AND AT BEDTIME     Endocrinology:  Diabetes - Biguanides Failed - 06/21/2022  8:14 AM      Failed - HBA1C is between 0 and 7.9 and within 180 days    HB A1C (BAYER DCA - WAIVED)  Date Value Ref Range Status  06/28/2021 8.2 (H) 4.8 - 5.6 % Final    Comment:             Prediabetes: 5.7 - 6.4          Diabetes: >6.4          Glycemic control for adults with diabetes: <7.0    Hgb A1c MFr Bld  Date Value Ref Range Status  10/02/2021 10.8 (H) 4.8 - 5.6 % Final    Comment:             Prediabetes: 5.7 - 6.4          Diabetes: >6.4          Glycemic control for adults with diabetes: <7.0          Failed - Valid encounter within last 6 months    Recent Outpatient Visits           8 months ago Essential hypertension   [place] [facility] Collier, Matthew E, PA-C   11 months ago Type 2 diabetes mellitus with obesity (HCC)   [place] [facility] Collier, Avanti, MD   1 year ago Gastrointestinal hemorrhage associated with anorectal source   [place] [facility] Collier, Avanti, MD   1 year ago Upper respiratory tract infection, unspecified type   [place] [facility] Collier, Avanti, MD   1 year ago Hypertension associated with diabetes (HCC)   [place] [facility] Collier, Avanti, MD              Passed - Cr in normal range and within 360 days    Creatinine,  Ser  Date Value Ref Range Status  10/02/2021 0.84 0.76 - 1.27 mg/dL Final         Passed - eGFR in normal range and within 360 days    GFR calc Af Amer  Date Value Ref Range Status  01/04/2020 117 >59 mL/min/1.73 Final    Comment:    **In accordance with recommendations from the NKF-ASN Task force,**   [facility] is in the process of updating its eGFR calculation to the   2021 CKD-EPI creatinine equation that estimates kidney function   without a race  variable.    GFR, Estimated  Date Value Ref Range Status  05/18/2021 >60 >60 mL/min Final    Comment:    (NOTE) Calculated using the CKD-EPI Creatinine Equation (2021)    eGFR  Date Value Ref Range Status  10/02/2021 101 >59 mL/min/1.73 Final         Passed - B12 Level in normal range and within 720 days    Vitamin B-12  Date Value Ref Range Status  05/23/2021 342 232 - 1,245 pg/mL Final         Passed - CBC within normal limits and completed in the last 12 months    WBC  Date Value Ref Range Status  10/02/2021 6.9 3.4 - 10.8 x10E3/uL Final  05/18/2021 4.9 4.0 - 10.5 K/uL Final   RBC  Date Value Ref Range Status  10/02/2021 5.49 4.14 - 5.80 x10E6/uL Final  05/18/2021 3.01 (L) 4.22 - 5.81 MIL/uL Final   Hemoglobin  Date Value Ref Range Status  10/02/2021 17.7 13.0 - 17.7 g/dL Final   Hematocrit  Date Value Ref Range Status  10/02/2021 50.0 37.5 - 51.0 % Final   MCHC  Date Value Ref Range Status  10/02/2021 35.4 31.5 - 35.7 g/dL Final  16/10/9602 54.0 30.0 - 36.0 g/dL Final   [facility]  Date Value Ref Range Status  10/02/2021 32.2 26.6 - 33.0 pg Final  05/18/2021 32.2 26.0 - 34.0 pg Final   MCV  Date Value Ref Range Status  10/02/2021 91 79 - 97 fL Final   No results found for: "PLTCOUNTKUC", "LABPLAT", "POCPLA" RDW  Date Value Ref Range Status  10/02/2021 13.0 11.6 - 15.4 % Final

## 2022-08-18 NOTE — Patient Instructions (Signed)
Diabetes Mellitus Basics  Diabetes mellitus, or diabetes, is a long-term (chronic) disease. It occurs when the body does not properly use sugar (glucose) that is released from food after you eat. Diabetes mellitus may be caused by one or both of these problems: Your pancreas does not make enough of a hormone called insulin. Your body does not react in a normal way to the insulin that it makes. Insulin lets glucose enter cells in your body. This gives you energy. If you have diabetes, glucose cannot get into cells. This causes high blood glucose (hyperglycemia). How to treat and manage diabetes You may need to take insulin or other diabetes medicines daily to keep your glucose in balance. If you are prescribed insulin, you will learn how to give yourself insulin by injection. You may need to adjust the amount of insulin you take based on the foods that you eat. You will need to check your blood glucose levels using a glucose monitor as told by your health care provider. The readings can help determine if you have low or high blood glucose. Generally, you should have these blood glucose levels: Before meals (preprandial): 80-130 mg/dL (4.4-7.2 mmol/L). After meals (postprandial): below 180 mg/dL (10 mmol/L). Hemoglobin A1c (HbA1c) level: less than 7%. Your health care provider will set treatment goals for you. Keep all follow-up visits. This is important. Follow these instructions at home: Diabetes medicines Take your diabetes medicines every day as told by your health care provider. List your diabetes medicines here: Name of medicine: ______________________________ Amount (dose): _______________ Time (a.m./p.m.): _______________ Notes: ___________________________________ Name of medicine: ______________________________ Amount (dose): _______________ Time (a.m./p.m.): _______________ Notes: ___________________________________ Name of medicine: ______________________________ Amount (dose):  _______________ Time (a.m./p.m.): _______________ Notes: ___________________________________ Insulin If you use insulin, list the types of insulin you use here: Insulin type: ______________________________ Amount (dose): _______________ Time (a.m./p.m.): _______________Notes: ___________________________________ Insulin type: ______________________________ Amount (dose): _______________ Time (a.m./p.m.): _______________ Notes: ___________________________________ Insulin type: ______________________________ Amount (dose): _______________ Time (a.m./p.m.): _______________ Notes: ___________________________________ Insulin type: ______________________________ Amount (dose): _______________ Time (a.m./p.m.): _______________ Notes: ___________________________________ Insulin type: ______________________________ Amount (dose): _______________ Time (a.m./p.m.): _______________ Notes: ___________________________________ Managing blood glucose  Check your blood glucose levels using a glucose monitor as told by your health care provider. Write down the times that you check your glucose levels here: Time: _______________ Notes: ___________________________________ Time: _______________ Notes: ___________________________________ Time: _______________ Notes: ___________________________________ Time: _______________ Notes: ___________________________________ Time: _______________ Notes: ___________________________________ Time: _______________ Notes: ___________________________________  Low blood glucose Low blood glucose (hypoglycemia) is when glucose is at or below 70 mg/dL (3.9 mmol/L). Symptoms may include: Feeling: Hungry. Sweaty and clammy. Irritable or easily upset. Dizzy. Sleepy. Having: A fast heartbeat. A headache. A change in your vision. Numbness around the mouth, lips, or tongue. Having trouble with: Moving (coordination). Sleeping. Treating low blood glucose To treat low blood  glucose, eat or drink something containing sugar right away. If you can think clearly and swallow safely, follow the 15:15 rule: Take 15 grams of a fast-acting carb (carbohydrate), as told by your health care provider. Some fast-acting carbs are: Glucose tablets: take 3-4 tablets. Hard candy: eat 3-5 pieces. Fruit juice: drink 4 oz (120 mL). Regular (not diet) soda: drink 4-6 oz (120-180 mL). Honey or sugar: eat 1 Tbsp (15 mL). Check your blood glucose levels 15 minutes after you take the carb. If your glucose is still at or below 70 mg/dL (3.9 mmol/L), take 15 grams of a carb again. If your glucose does not go above 70 mg/dL (3.9 mmol/L) after   3 tries, get help right away. After your glucose goes back to normal, eat a meal or a snack within 1 hour. Treating very low blood glucose If your glucose is at or below 54 mg/dL (3 mmol/L), you have very low blood glucose (severe hypoglycemia). This is an emergency. Do not wait to see if the symptoms will go away. Get medical help right away. Call your local emergency services (911 in the [place] Do not drive yourself to the hospital. Questions to ask your health care provider Should I talk with a diabetes educator? What equipment will I need to care for myself at home? What diabetes medicines do I need? When should I take them? How often do I need to check my blood glucose levels? What number can I call if I have questions? When is my follow-up visit? Where can I find a support group for people with diabetes? Where to find more information American Diabetes Association: www.diabetes.org Association of Diabetes Care and Education Specialists: www.diabeteseducator.org Contact a health care provider if: Your blood glucose is at or above 240 mg/dL (13.3 mmol/L) for 2 days in a row. You have been sick or have had a fever for 2 days or more, and you are not getting better. You have any of these problems for more than 6 hours: You cannot eat or  drink. You feel nauseous. You vomit. You have diarrhea. Get help right away if: Your blood glucose is lower than 54 mg/dL (3 mmol/L). You get confused. You have trouble thinking clearly. You have trouble breathing. These symptoms may represent a serious problem that is an emergency. Do not wait to see if the symptoms will go away. Get medical help right away. Call your local emergency services (911 in the [place] Do not drive yourself to the hospital. Summary Diabetes mellitus is a chronic disease that occurs when the body does not properly use sugar (glucose) that is released from food after you eat. Take insulin and diabetes medicines as told. Check your blood glucose every day, as often as told. Keep all follow-up visits. This is important. This information is not intended to replace advice given to you by your health care provider. Make sure you discuss any questions you have with your health care provider. Document Revised: 04/27/2019 Document Reviewed: 04/27/2019 Elsevier Patient Education  2024 Elsevier Inc.  

## 2022-08-23 ENCOUNTER — Ambulatory Visit: Payer: BC Managed Care – PPO | Admitting: Nurse Practitioner

## 2022-08-23 ENCOUNTER — Encounter: Payer: Self-pay | Admitting: Nurse Practitioner

## 2022-08-23 VITALS — BP 138/81 | HR 89 | Temp 97.8°F | Ht 68.8 in | Wt 205.6 lb

## 2022-08-23 DIAGNOSIS — E538 Deficiency of other specified B group vitamins: Secondary | ICD-10-CM

## 2022-08-23 DIAGNOSIS — E1169 Type 2 diabetes mellitus with other specified complication: Secondary | ICD-10-CM | POA: Diagnosis not present

## 2022-08-23 DIAGNOSIS — I152 Hypertension secondary to endocrine disorders: Secondary | ICD-10-CM

## 2022-08-23 DIAGNOSIS — E669 Obesity, unspecified: Secondary | ICD-10-CM

## 2022-08-23 DIAGNOSIS — Z6831 Body mass index (BMI) 31.0-31.9, adult: Secondary | ICD-10-CM

## 2022-08-23 DIAGNOSIS — Z1159 Encounter for screening for other viral diseases: Secondary | ICD-10-CM

## 2022-08-23 DIAGNOSIS — E1159 Type 2 diabetes mellitus with other circulatory complications: Secondary | ICD-10-CM

## 2022-08-23 DIAGNOSIS — K2270 Barrett's esophagus without dysplasia: Secondary | ICD-10-CM | POA: Diagnosis not present

## 2022-08-23 DIAGNOSIS — E785 Hyperlipidemia, unspecified: Secondary | ICD-10-CM

## 2022-08-23 DIAGNOSIS — Z91148 Patient's other noncompliance with medication regimen for other reason: Secondary | ICD-10-CM

## 2022-08-23 DIAGNOSIS — E6609 Other obesity due to excess calories: Secondary | ICD-10-CM

## 2022-08-23 DIAGNOSIS — N400 Enlarged prostate without lower urinary tract symptoms: Secondary | ICD-10-CM

## 2022-08-23 LAB — MICROALBUMIN, URINE WAIVED
Creatinine, Urine Waived: 200 mg/dL (ref 10–300)
Microalb, Ur Waived: 80 mg/L — ABNORMAL HIGH (ref 0–19)

## 2022-08-23 LAB — BAYER DCA HB A1C WAIVED: HB A1C (BAYER DCA - WAIVED): 12.9 % — ABNORMAL HIGH (ref 4.8–5.6)

## 2022-08-23 MED ORDER — SIMVASTATIN 40 MG PO TABS
40.0000 mg | ORAL_TABLET | Freq: Every day | ORAL | 4 refills | Status: DC
Start: 2022-08-23 — End: 2023-11-17

## 2022-08-23 MED ORDER — OZEMPIC (0.25 OR 0.5 MG/DOSE) 2 MG/3ML ~~LOC~~ SOPN: 0.5000 mg | PEN_INJECTOR | SUBCUTANEOUS | 12 refills | Status: DC

## 2022-08-23 MED ORDER — PANTOPRAZOLE SODIUM 40 MG PO TBEC: 40.0000 mg | DELAYED_RELEASE_TABLET | Freq: Every day | ORAL | 4 refills | Status: DC

## 2022-08-23 MED ORDER — EMPAGLIFLOZIN 25 MG PO TABS: 25.0000 mg | ORAL_TABLET | Freq: Every day | ORAL | 12 refills | Status: DC

## 2022-08-23 MED ORDER — BENAZEPRIL HCL 40 MG PO TABS
40.0000 mg | ORAL_TABLET | Freq: Every day | ORAL | 4 refills | Status: DC
Start: 2022-08-23 — End: 2023-10-01

## 2022-08-23 NOTE — Assessment & Plan Note (Signed)
Chronic, ongoing.  A1c upward trend to 12.9% today due to poor medication compliance and loss of follow-up.  Discussed with patient at length risks of high A1c and diabetes to include vision impairment, stroke, MI, loss of limbs, poor healing wounds, kidney disease. Reiterated the importance of maintaining regular follow-up and that diabetes is a team approach, with patient being leader. - Continue Metformin 500 MG TID.  Restart Ozempic at 0.5 MG weekly, tolerated this dosing, and can gradually try to increase in future.  Start Jardiance 25 MG daily, tolerated SGLT2 family in past.  He is aware that if these are too costly to alert provider and a referral to pharmacist can be placed.  Goal is to avoid insulin, which patient prefers to avoid as well. - He is to check sugars twice a day, supplied him with form, discussed goals of fasting <130 and 2 hours after meals <180 - Foot exam up to date.  Recommend he schedule eye exam. - Has had PPSV23, no flu shot, refuses. - Recommend he restart statin and ACE.

## 2022-08-23 NOTE — Assessment & Plan Note (Signed)
Chronic, ongoing.  Has been off Benazepril, recommend he restart this and explained reasons why -- kidney protection in diabetes and BP control.  Urine ALB 80 August 2024, educated him on this.  Recommend he monitor BP at least a few mornings a week at home and document.  DASH diet at home.  Continue current medication regimen and adjust as needed.  Labs today: CBC, CMP, TSH, urine ALB.

## 2022-08-23 NOTE — Assessment & Plan Note (Signed)
Noted on labs 04/03/20, not taking supplement.  Is on long term Metformin. Recheck today and initiate supplement as needed.

## 2022-08-23 NOTE — Progress Notes (Signed)
BP 138/81   Pulse 89   Temp 97.8 F (36.6 C) (Oral)   Ht 5' 8.8" (1.748 m)   Wt 205 lb 9.6 oz (93.3 kg)   SpO2 97%   BMI 30.54 kg/m    Subjective:    Patient ID: Matthew Collier, male    DOB: [birth year], 57 y.o.   MRN: [id number]  HPI: Matthew Collier is a 57 y.o. male  Chief Complaint  Patient presents with   Diabetes    Pt states he has not had a recent eye exam    Hyperlipidemia   Hypertension   DIABETES Last A1c was 10.8% in September 2023, has been lost to follow-up since then.  When Dr. Charlotta Newton left he felt like did not have provider.  Currently is taking Metformin 500 MG TID.  He tried Ozempic, but this made him feel bad at 1 MG -- he tolerated the lower doses.  Stopped taking 1 MG.    In past took Glipizide, Xigduo (had tolerated this, but became too costly).   He reports in past when he consistently took Glipizide regularly it caused sugars in 60's.   Hypoglycemic episodes:no Polydipsia/polyuria: no Visual disturbance: no Chest pain: no Paresthesias: no Glucose Monitoring: yes             Accucheck frequency: not checking             Fasting glucose:             Post prandial:              Evening:             Before meals: Taking Insulin?: no             Long acting insulin:             Short acting insulin: Blood Pressure Monitoring: not checking Retinal Examination: Not Up to Date - [facility] Foot Exam: Up to Date Pneumovax: Up To Date in 2007 Influenza: refused - last in 2017 Aspirin: no   HYPERTENSION / HYPERLIPIDEMIA Continues on Simvastatin and Benazepril, however has not taken both in months. Satisfied with current treatment? yes Duration of hypertension: chronic BP monitoring frequency: not checking BP range:  BP medication side effects: no Duration of hyperlipidemia: chronic Cholesterol medication side effects: no Cholesterol supplements: none Medication compliance: good compliance Aspirin: yes Recent stressors: no Recurrent headaches:  no Visual changes: no Palpitations: no Dyspnea: no Chest pain: no Lower extremity edema: no Dizzy/lightheaded: no The 10-year ASCVD risk score (Arnett DK, et al., 2019) is: 18.2%   Values used to calculate the score:     Age: 1 years     Sex: Male     Is Non-Hispanic African American: No     Diabetic: Yes     Tobacco smoker: No     Systolic Blood Pressure: 138 mmHg     Is BP treated: Yes     HDL Cholesterol: 36 mg/dL     Total Cholesterol: 145 mg/dL  GERD Would like to get back on Protonix, has Barrett's esophagus. GERD control status: exacerbated Satisfied with current treatment? no Heartburn frequency: intermittent Medication side effects: no  Medication compliance: stable Antacid use frequency:  daily Dysphagia: no Odynophagia:  no Hematemesis: no Blood in stool: no EGD: yes   Relevant past medical, surgical, family and social history reviewed and updated as indicated. Interim medical history since our last visit reviewed. Allergies and medications reviewed and updated.  Review  of Systems  Constitutional:  Negative for activity change, diaphoresis, fatigue and fever.  Respiratory:  Negative for cough, chest tightness, shortness of breath and wheezing.   Cardiovascular:  Negative for chest pain, palpitations and leg swelling.  Gastrointestinal: Negative.   Endocrine: Negative for cold intolerance, heat intolerance, polydipsia, polyphagia and polyuria.  Neurological: Negative.   Psychiatric/Behavioral: Negative.      Per HPI unless specifically indicated above     Objective:    BP 138/81   Pulse 89   Temp 97.8 F (36.6 C) (Oral)   Ht 5' 8.8" (1.748 m)   Wt 205 lb 9.6 oz (93.3 kg)   SpO2 97%   BMI 30.54 kg/m   Wt Readings from Last 3 Encounters:  08/23/22 205 lb 9.6 oz (93.3 kg)  06/28/21 219 lb 12.8 oz (99.7 kg)  06/15/21 220 lb (99.8 kg)    Physical Exam Vitals and nursing note reviewed.  Constitutional:      General: He is awake.      Appearance: He is well-developed and well-groomed. He is obese.  HENT:     Head: Normocephalic and atraumatic.     Right Ear: Hearing normal. No drainage.     Left Ear: Hearing normal. No drainage.  Eyes:     General: Lids are normal.        Right eye: No discharge.        Left eye: No discharge.     Conjunctiva/sclera: Conjunctivae normal.     Pupils: Pupils are equal, round, and reactive to light.  Neck:     Thyroid: No thyromegaly.     Vascular: No carotid bruit.     Trachea: Trachea normal.  Cardiovascular:     Rate and Rhythm: Normal rate and regular rhythm.     Heart sounds: Normal heart sounds, S1 normal and S2 normal. No murmur heard.    No gallop.  Pulmonary:     Effort: Pulmonary effort is normal. No accessory muscle usage or respiratory distress.     Breath sounds: Normal breath sounds.  Abdominal:     General: Bowel sounds are normal.     Palpations: Abdomen is soft.     Tenderness: There is no abdominal tenderness.  Musculoskeletal:        General: Normal range of motion.     Cervical back: Normal range of motion and neck supple.     Right lower leg: No edema.     Left lower leg: No edema.  Skin:    General: Skin is warm and dry.     Capillary Refill: Capillary refill takes less than 2 seconds.     Findings: No rash.  Neurological:     Mental Status: He is alert and oriented to person, place, and time.     Deep Tendon Reflexes: Reflexes are normal and symmetric.     Reflex Scores:      Brachioradialis reflexes are 2+ on the right side and 2+ on the left side.      Patellar reflexes are 2+ on the right side and 2+ on the left side. Psychiatric:        Attention and Perception: Attention normal.        Mood and Affect: Mood normal.        Speech: Speech normal.        Behavior: Behavior normal. Behavior is cooperative.        Thought Content: Thought content normal.    Diabetic Foot Exam - Simple  Simple Foot Form Visual Inspection No deformities, no  ulcerations, no other skin breakdown bilaterally: Yes Sensation Testing Intact to touch and monofilament testing bilaterally: Yes Pulse Check Posterior Tibialis and Dorsalis pulse intact bilaterally: Yes Comments    Results for orders placed or performed in visit on 08/23/22  Bayer DCA Hb A1c Waived  Result Value Ref Range   HB A1C (BAYER DCA - WAIVED) 12.9 (H) 4.8 - 5.6 %  Microalbumin, Urine Waived  Result Value Ref Range   Microalb, Ur Waived 80 (H) 0 - 19 mg/L   Creatinine, Urine Waived 200 10 - 300 mg/dL   Microalb/Creat Ratio 30-300 (H) <30 mg/g      Assessment & Plan:   Problem List Items Addressed This Visit       Cardiovascular and Mediastinum   Hypertension associated with diabetes (HCC)    Chronic, ongoing.  Has been off Benazepril, recommend he restart this and explained reasons why -- kidney protection in diabetes and BP control.  Urine ALB 80 August 2024, educated him on this.  Recommend he monitor BP at least a few mornings a week at home and document.  DASH diet at home.  Continue current medication regimen and adjust as needed.  Labs today: CBC, CMP, TSH, urine ALB.         Relevant Medications   empagliflozin (JARDIANCE) 25 MG TABS tablet   Semaglutide,0.25 or 0.5MG /DOS, (OZEMPIC, 0.25 OR 0.5 MG/DOSE,) 2 MG/3ML SOPN   benazepril (LOTENSIN) 40 MG tablet   simvastatin (ZOCOR) 40 MG tablet   Other Relevant Orders   Bayer DCA Hb A1c Waived (Completed)   Microalbumin, Urine Waived (Completed)   CBC with Differential/Platelet   Comprehensive metabolic panel   TSH     Digestive   Barrett's esophagus without dysplasia    Chronic, ongoing.  Followed by GI in past.  Restart Protonix at 40 MG daily and obtain Mag level today.  Adjust regimen as needed.  Risks of PPI use were discussed with patient including bone loss, C. Diff diarrhea, pneumonia, infections, CKD, electrolyte abnormalities.  Verbalizes understanding and chooses to continue the medication.        Relevant Orders   Magnesium     Endocrine   Hyperlipidemia associated with type 2 diabetes mellitus (HCC)    Chronic, ongoing.  Continue current medication regimen and adjust as needed.  Lipid panel today.  Recommend he consistently take statin, as currently has been off for months.  Educated him on the importance of statin use in diabetes and risks if not consistently taken.      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  A1c upward trend to 12.9% today due to poor medication compliance and loss of follow-up.  Discussed with patient at length risks of high A1c and diabetes to include vision impairment, stroke, MI, loss of limbs, poor healing wounds, kidney disease. Reiterated the importance of maintaining regular follow-up and that diabetes is a team approach, with patient being leader. - Continue Metformin 500 MG TID.  Restart Ozempic at 0.5 MG weekly, tolerated this dosing, and can  gradually try to increase in future.  Start Jardiance 25 MG daily, tolerated SGLT2 family in past.  He is aware that if these are too costly to alert provider and a referral to pharmacist can be placed.  Goal is to avoid insulin, which patient prefers to avoid as well. - He is to check sugars twice a day, supplied him with form, discussed goals of fasting <130 and 2 hours after meals <180 - Foot exam up to date.  Recommend he schedule eye exam. - Has had PPSV23, no flu shot, refuses. - Recommend he restart statin and ACE.      Relevant Medications   empagliflozin (JARDIANCE) 25 MG TABS tablet   Semaglutide,0.25 or 0.5MG /DOS, (OZEMPIC,  0.25 OR 0.5 MG/DOSE,) 2 MG/3ML SOPN   benazepril (LOTENSIN) 40 MG tablet   simvastatin (ZOCOR) 40 MG tablet   Other Relevant Orders   Bayer DCA Hb A1c Waived (Completed)   Microalbumin, Urine Waived (Completed)     Other   B12 deficiency    Noted on labs 04/03/20, not taking supplement.  Is on long term Metformin. Recheck today and initiate supplement as needed.      Relevant Orders   Vitamin B12   Obesity    BMI 30.54 today, some loss with Ozempic.  Recommended eating smaller high protein, low fat meals more frequently and exercising 30 mins a day 5 times a week with a goal of 10-15lb weight loss in the next 3 months. Patient voiced their understanding and motivation to adhere to these recommendations.       Relevant Medications   empagliflozin (JARDIANCE) 25 MG TABS tablet   Semaglutide,0.25 or 0.5MG /DOS, (OZEMPIC, 0.25 OR 0.5 MG/DOSE,) 2 MG/3ML SOPN   Poor compliance with medication    Ongoing issue.  Reiterated at length importance of follow-up visits and taking medication as ordered.  Discussed with patient at length risks of high A1c and diabetes to include vision impairment, stroke, MI, loss of limbs, poor healing wounds, kidney disease. Reiterated the importance of maintaining regular follow-up and that diabetes is a team approach, with patient being leader. Educated him on care teams and if medications too costly can place referral to pharmacist.      Other Visit Diagnoses     Benign prostatic hyperplasia without lower urinary tract symptoms       PSA check on labs today.   Relevant Orders   PSA   Need for hepatitis C screening test       Due for Hep C screening per guidelines, discussed with patient.   Relevant Orders   Hepatitis C antibody        Follow up plan: Return in about 6 weeks (around 10/04/2022) for T2DM -- added medication and then 3 months for A1c check -- can keep with me as I followed him befo.

## 2022-08-23 NOTE — Assessment & Plan Note (Signed)
Chronic, ongoing.  Continue current medication regimen and adjust as needed.  Lipid panel today.  Recommend he consistently take statin, as currently has been off for months.  Educated him on the importance of statin use in diabetes and risks if not consistently taken.

## 2022-08-23 NOTE — Assessment & Plan Note (Addendum)
Chronic, ongoing.  Followed by GI in past.  Restart Protonix at 40 MG daily and obtain Mag level today.  Adjust regimen as needed.  Risks of PPI use were discussed with patient including bone loss, C. Diff diarrhea, pneumonia, infections, CKD, electrolyte abnormalities.  Verbalizes understanding and chooses to continue the medication.

## 2022-08-23 NOTE — Assessment & Plan Note (Signed)
Ongoing issue.  Reiterated at length importance of follow-up visits and taking medication as ordered.  Discussed with patient at length risks of high A1c and diabetes to include vision impairment, stroke, MI, loss of limbs, poor healing wounds, kidney disease. Reiterated the importance of maintaining regular follow-up and that diabetes is a team approach, with patient being leader. Educated him on care teams and if medications too costly can place referral to pharmacist.

## 2022-08-23 NOTE — Assessment & Plan Note (Signed)
BMI 30.54 today, some loss with Ozempic.  Recommended eating smaller high protein, low fat meals more frequently and exercising 30 mins a day 5 times a week with a goal of 10-15lb weight loss in the next 3 months. Patient voiced their understanding and motivation to adhere to these recommendations.

## 2022-08-24 LAB — TSH: TSH: 0.721 u[IU]/mL (ref 0.450–4.500)

## 2022-08-24 LAB — COMPREHENSIVE METABOLIC PANEL
ALT: 19 IU/L (ref 0–44)
AST: 16 IU/L (ref 0–40)
Albumin: 4.6 g/dL (ref 3.8–4.9)
Alkaline Phosphatase: 64 IU/L (ref 44–121)
BUN/Creatinine Ratio: 18 (ref 9–20)
BUN: 17 mg/dL (ref 6–24)
Bilirubin Total: 0.6 mg/dL (ref 0.0–1.2)
CO2: 24 mmol/L (ref 20–29)
Calcium: 10.1 mg/dL (ref 8.7–10.2)
Chloride: 101 mmol/L (ref 96–106)
Creatinine, Ser: 0.92 mg/dL (ref 0.76–1.27)
Globulin, Total: 2.3 g/dL (ref 1.5–4.5)
Glucose: 265 mg/dL — ABNORMAL HIGH (ref 70–99)
Potassium: 4.7 mmol/L (ref 3.5–5.2)
Sodium: 140 mmol/L (ref 134–144)
Total Protein: 6.9 g/dL (ref 6.0–8.5)
eGFR: 96 mL/min/{1.73_m2} (ref >59)

## 2022-08-24 LAB — LIPID PANEL W/O CHOL/HDL RATIO
Cholesterol, Total: 188 mg/dL (ref 100–199)
HDL: 41 mg/dL (ref >39)
LDL Chol Calc (NIH): 93 mg/dL (ref 0–99)
Triglycerides: 324 mg/dL — ABNORMAL HIGH (ref 0–149)
VLDL Cholesterol Cal: 54 mg/dL — ABNORMAL HIGH (ref 5–40)

## 2022-08-24 LAB — PSA: Prostate Specific Ag, Serum: 1.2 ng/mL (ref 0.0–4.0)

## 2022-08-24 LAB — CBC WITH DIFFERENTIAL/PLATELET
Basophils Absolute: 0.1 10*3/uL (ref 0.0–0.2)
Basos: 1 %
EOS (ABSOLUTE): 0 10*3/uL (ref 0.0–0.4)
Eos: 1 %
Hematocrit: 50.3 % (ref 37.5–51.0)
Hemoglobin: 16.8 g/dL (ref 13.0–17.7)
Immature Grans (Abs): 0.1 10*3/uL (ref 0.0–0.1)
Immature Granulocytes: 1 %
Lymphocytes Absolute: 1.2 10*3/uL (ref 0.7–3.1)
Lymphs: 15 %
MCH: 31 pg (ref 26.6–33.0)
MCHC: 33.4 g/dL (ref 31.5–35.7)
MCV: 93 fL (ref 79–97)
Monocytes Absolute: 0.6 10*3/uL (ref 0.1–0.9)
Monocytes: 9 %
Neutrophils Absolute: 5.6 10*3/uL (ref 1.4–7.0)
Neutrophils: 73 %
Platelets: 200 10*3/uL (ref 150–450)
RBC: 5.42 x10E6/uL (ref 4.14–5.80)
RDW: 11.9 % (ref 11.6–15.4)
WBC: 7.6 10*3/uL (ref 3.4–10.8)

## 2022-08-24 LAB — MAGNESIUM: Magnesium: 2 mg/dL (ref 1.6–2.3)

## 2022-08-24 LAB — HEPATITIS C ANTIBODY: Hep C Virus Ab: NONREACTIVE

## 2022-08-24 LAB — VITAMIN B12: Vitamin B-12: 301 pg/mL (ref 232–1245)

## 2022-08-24 NOTE — Progress Notes (Signed)
Contacted via MyChart   Good evening Matthew Collier, your labs have returned: - Kidney function, creatinine and eGFR, remains normal, as is liver function, AST and ALT.  - Cholesterol labs are overall stable with exception of triglycerides, however restart your Simvastatin daily for better control and prevention of heart attack or stroke. - Vitamin B12 level on lower side, I recommend starting over the counter Vitamin B12 1000 MCG daily to help this.  It is good for nervous system health. - Remainder of labs stable.  Any questions? Keep being amazing!!  Thank you for allowing me to participate in your care.  I appreciate you. Kindest regards, Kendrix Orman

## 2022-08-30 ENCOUNTER — Other Ambulatory Visit: Payer: Self-pay | Admitting: Family Medicine

## 2022-08-30 NOTE — Telephone Encounter (Signed)
Requested Prescriptions  Pending Prescriptions Disp Refills   metFORMIN (GLUCOPHAGE) 500 MG tablet [Pharmacy Med Name: METFORMIN HCL 500 MG TAB] 270 tablet 1    Sig: TAKE 1 TABLET BY MOUTH ONCE EVERY MORNING AT NOON AND AT BEDTIME     Endocrinology:  Diabetes - Biguanides Failed - 08/30/2022 10:25 AM      Failed - HBA1C is between 0 and 7.9 and within 180 days    HB A1C (BAYER DCA - WAIVED)  Date Value Ref Range Status  08/23/2022 12.9 (H) 4.8 - 5.6 % Final    Comment:             Prediabetes: 5.7 - 6.4          Diabetes: >6.4          Glycemic control for adults with diabetes: <7.0          Passed - Cr in normal range and within 360 days    Creatinine, Ser  Date Value Ref Range Status  08/23/2022 0.92 0.76 - 1.27 mg/dL Final         Passed - eGFR in normal range and within 360 days    GFR calc Af Amer  Date Value Ref Range Status  01/04/2020 117 >59 mL/min/1.73 Final    Comment:    **In accordance with recommendations from the NKF-ASN Task force,**   [facility] is in the process of updating its eGFR calculation to the   2021 CKD-EPI creatinine equation that estimates kidney function   without a race variable.    GFR, Estimated  Date Value Ref Range Status  05/18/2021 >60 >60 mL/min Final    Comment:    (NOTE) Calculated using the CKD-EPI Creatinine Equation (2021)    eGFR  Date Value Ref Range Status  08/23/2022 96 >59 mL/min/1.73 Final         Passed - B12 Level in normal range and within 720 days    Vitamin B-12  Date Value Ref Range Status  08/23/2022 301 232 - 1,245 pg/mL Final         Passed - Valid encounter within last 6 months    Recent Outpatient Visits           1 week ago Type 2 diabetes mellitus with obesity (HCC)   [place] [facility] Lomas, Corrie Dandy T, NP   11 months ago Essential hypertension   [place] [facility] Mecum, Erin E, PA-C   1 year ago Type 2 diabetes mellitus with obesity (HCC)   [place]  [facility] Vigg, Avanti, MD   1 year ago Gastrointestinal hemorrhage associated with anorectal source   [place] [facility] Vigg, Avanti, MD   1 year ago Upper respiratory tract infection, unspecified type   [place] [facility] Vigg, Avanti, MD       Future Appointments             In 1 month Cannady, Dorie Rank, NP [facility]   In 4 months Brave, Blasdell T, NP [place] [facility]            Passed - CBC within normal limits and completed in the last 12 months    WBC  Date Value Ref Range Status  08/23/2022 7.6 3.4 - 10.8 x10E3/uL Final  05/18/2021 4.9 4.0 - 10.5 K/uL Final   RBC  Date Value Ref Range Status  08/23/2022 5.42 4.14 - 5.80  x10E6/uL Final  05/18/2021 3.01 (L) 4.22 - 5.81 MIL/uL Final   Hemoglobin  Date Value Ref Range Status  08/23/2022 16.8 13.0 - 17.7 g/dL Final   Hematocrit  Date Value Ref Range Status  08/23/2022 50.3 37.5 - 51.0 % Final   MCHC  Date Value Ref Range Status  08/23/2022 33.4 31.5 - 35.7 g/dL Final  47/82/9562 13.0 30.0 - 36.0 g/dL Final   [facility]  Date Value Ref Range Status  08/23/2022 31.0 26.6 - 33.0 pg Final  05/18/2021 32.2 26.0 - 34.0 pg Final   MCV  Date Value Ref Range Status  08/23/2022 93 79 - 97 fL Final   No results found for: "PLTCOUNTKUC", "LABPLAT", "POCPLA" RDW  Date Value Ref Range Status  08/23/2022 11.9 11.6 - 15.4 % Final

## 2022-09-29 NOTE — Patient Instructions (Signed)

## 2022-10-04 ENCOUNTER — Encounter: Payer: Self-pay | Admitting: Nurse Practitioner

## 2022-10-04 ENCOUNTER — Ambulatory Visit: Payer: BC Managed Care – PPO | Admitting: Nurse Practitioner

## 2022-10-04 VITALS — BP 124/80 | HR 92 | Temp 98.1°F | Ht 68.8 in | Wt 198.8 lb

## 2022-10-04 DIAGNOSIS — E1169 Type 2 diabetes mellitus with other specified complication: Secondary | ICD-10-CM | POA: Diagnosis not present

## 2022-10-04 DIAGNOSIS — E669 Obesity, unspecified: Secondary | ICD-10-CM | POA: Diagnosis not present

## 2022-10-04 DIAGNOSIS — E1159 Type 2 diabetes mellitus with other circulatory complications: Secondary | ICD-10-CM

## 2022-10-04 DIAGNOSIS — I152 Hypertension secondary to endocrine disorders: Secondary | ICD-10-CM | POA: Diagnosis not present

## 2022-10-04 DIAGNOSIS — Z7985 Long-term (current) use of injectable non-insulin antidiabetic drugs: Secondary | ICD-10-CM

## 2022-10-04 NOTE — Assessment & Plan Note (Signed)
Chronic, ongoing.  Restarted Benazepril last visit and he is tolerating this better at night, continue to take at night.  BP at goal on recheck.  Urine ALB 80 August 2024, educated him on this.  Recommend he monitor BP at least a few mornings a week at home and document.  DASH diet at home.  Continue current medication regimen and adjust as needed.  Labs today: up to date.

## 2022-10-04 NOTE — Assessment & Plan Note (Signed)
Chronic, ongoing.  A1c upward trend to 12.9% in August due to poor medication compliance and loss of follow-up.  Discussed with patient at length risks of high A1c and diabetes to include vision impairment, stroke, MI, loss of limbs, poor healing wounds, kidney disease. Reiterated the importance of maintaining regular follow-up and that diabetes is a team approach, with patient being leader. - Continue Metformin 500 MG TID, Ozempic at 0.5 MG weekly and Jardiance 25 MG daily.  Is tolerating these and seeing blood sugars trend down at home.  Matthew Collier is aware that if these are too costly to alert provider and a referral to pharmacist can be placed.  Goal is to avoid insulin, which patient prefers to avoid as well. - Matthew Collier is to check sugars twice a day, supplied him with form, discussed goals of fasting <130 and 2 hours after meals <180 - Foot exam up to date.  Recommend Matthew Collier schedule eye exam. - Has had PPSV23, no flu shot, refuses. - Taking ACE and Statin

## 2022-10-04 NOTE — Progress Notes (Signed)
BP 124/80 (BP Location: Left Arm, Patient Position: Sitting, Cuff Size: Normal)   Pulse 92   Temp 98.1 F (36.7 C) (Oral)   Ht 5' 8.8" (1.748 m)   Wt 198 lb 12.8 oz (90.2 kg)   SpO2 97%   BMI 29.53 kg/m    Subjective:    Patient ID: Matthew Collier, male    DOB: [birth year], 57 y.o.   MRN: [id number]  HPI: Matthew Collier is a 57 y.o. male  Chief Complaint  Patient presents with   Diabetes    Pt needs to reschedule for DM exam at [facility], will call soon.  Pt has been compliant with his meds, no SE from [place]. BS at home been running around 160s   Hypertension    Pt mentioned Matthew Collier has been skipping medicine some days   DIABETES A1c 12.9% on 08/23/22 (lost to follow-up) -- restarted Ozempic and Jardiance, continues Metformin 500 MG TID. Has been focusing on diet changes, has lost 7 pounds. Hypoglycemic episodes:no Polydipsia/polyuria: no Visual disturbance: no Chest pain: no Paresthesias: no Glucose Monitoring: yes  Accucheck frequency:  Matthew Collier checked a few after work  Fasting glucose:   Post prandial: 117 to 171 -- average around 140  Evening:  Before meals: Taking Insulin?: no  Long acting insulin:  Short acting insulin: Blood Pressure Monitoring: not checking Retinal Examination: Not up to Date Foot Exam: Up to Date Diabetic Education: Not Completed Pneumovax: Up To Date Influenza: refuses Aspirin: no   HYPERTENSION without Chronic Kidney Disease Restarted Benazepril on 08/23/22.  Had an episode dizziness at work with this and has shifted to taking at night, this is causing less issues. Hypertension status: stable  Satisfied with current treatment? yes Duration of hypertension: chronic BP monitoring frequency:  not checking BP range:  BP medication side effects:  no Medication compliance: good compliance Aspirin: no Recurrent headaches: no Visual changes: no Palpitations: no Dyspnea: no Chest pain: no Lower extremity edema: no Dizzy/lightheaded: one episode,  resolved  Relevant past medical, surgical, family and social history reviewed and updated as indicated. Interim medical history since our last visit reviewed. Allergies and medications reviewed and updated.  Review of Systems  Constitutional:  Negative for activity change, diaphoresis, fatigue and fever.  Respiratory:  Negative for cough, chest tightness, shortness of breath and wheezing.   Cardiovascular:  Negative for chest pain, palpitations and leg swelling.  Gastrointestinal: Negative.   Endocrine: Negative for cold intolerance, heat intolerance, polydipsia, polyphagia and polyuria.  Neurological: Negative.   Psychiatric/Behavioral: Negative.      Per HPI unless specifically indicated above     Objective:    BP 124/80 (BP Location: Left Arm, Patient Position: Sitting, Cuff Size: Normal)   Pulse 92   Temp 98.1 F (36.7 C) (Oral)   Ht 5' 8.8" (1.748 m)   Wt 198 lb 12.8 oz (90.2 kg)   SpO2 97%   BMI 29.53 kg/m   Wt Readings from Last 3 Encounters:  10/04/22 198 lb 12.8 oz (90.2 kg)  08/23/22 205 lb 9.6 oz (93.3 kg)  06/28/21 219 lb 12.8 oz (99.7 kg)    Physical Exam Vitals and nursing note reviewed.  Constitutional:      General: Matthew Collier is awake.     Appearance: Matthew Collier is well-developed and well-groomed. Matthew Collier is obese.  HENT:     Head: Normocephalic and atraumatic.     Right Ear: Hearing normal. No drainage.     Left Ear: Hearing normal. No drainage.  Eyes:     General: Lids are normal.        Right eye: No discharge.        Left eye: No discharge.     Conjunctiva/sclera: Conjunctivae normal.     Pupils: Pupils are equal, round, and reactive to light.  Neck:     Thyroid: No thyromegaly.     Vascular: No carotid bruit.     Trachea: Trachea normal.  Cardiovascular:     Rate and Rhythm: Normal rate and regular rhythm.     Heart sounds: Normal heart sounds, S1 normal and S2 normal. No murmur heard.    No gallop.  Pulmonary:     Effort: Pulmonary effort is normal. No  accessory muscle usage or respiratory distress.     Breath sounds: Normal breath sounds.  Abdominal:     General: Bowel sounds are normal.     Palpations: Abdomen is soft.     Tenderness: There is no abdominal tenderness.  Musculoskeletal:        General: Normal range of motion.     Cervical back: Normal range of motion and neck supple.     Right lower leg: No edema.     Left lower leg: No edema.  Skin:    General: Skin is warm and dry.     Capillary Refill: Capillary refill takes less than 2 seconds.     Findings: No rash.  Neurological:     Mental Status: Matthew Collier is alert and oriented to person, place, and time.     Deep Tendon Reflexes: Reflexes are normal and symmetric.     Reflex Scores:      Brachioradialis reflexes are 2+ on the right side and 2+ on the left side.      Patellar reflexes are 2+ on the right side and 2+ on the left side. Psychiatric:        Attention and Perception: Attention normal.        Mood and Affect: Mood normal.        Speech: Speech normal.        Behavior: Behavior normal. Behavior is cooperative.        Thought Content: Thought content normal.    Results for orders placed or performed in visit on 08/23/22  Bayer DCA Hb A1c Waived  Result Value Ref Range   HB A1C (BAYER DCA - WAIVED) 12.9 (H) 4.8 - 5.6 %  Microalbumin, Urine Waived  Result Value Ref Range   Microalb, Ur Waived 80 (H) 0 - 19 mg/L   Creatinine, Urine Waived 200 10 - 300 mg/dL   Microalb/Creat Ratio 30-300 (H) <30 mg/g  CBC with Differential/Platelet  Result Value Ref Range   WBC 7.6 3.4 - 10.8 x10E3/uL   RBC 5.42 4.14 - 5.80 x10E6/uL   Hemoglobin 16.8 13.0 - 17.7 g/dL   Hematocrit 16.1 09.6 - 51.0 %   MCV 93 79 - 97 fL   MCH 31.0 26.6 - 33.0 pg   MCHC 33.4 31.5 - 35.7 g/dL   RDW 04.5 40.9 - 81.1 %   Platelets 200 150 - 450 x10E3/uL   Neutrophils 73 Not Estab. %   Lymphs 15 Not Estab. %   Monocytes 9 Not Estab. %   Eos 1 Not Estab. %   Basos 1 Not Estab. %   Neutrophils  Absolute 5.6 1.4 - 7.0 x10E3/uL   Lymphocytes Absolute 1.2 0.7 - 3.1 x10E3/uL   Monocytes Absolute 0.6 0.1 - 0.9 x10E3/uL  EOS (ABSOLUTE) 0.0 0.0 - 0.4 x10E3/uL   Basophils Absolute 0.1 0.0 - 0.2 x10E3/uL   Immature Granulocytes 1 Not Estab. %   Immature Grans (Abs) 0.1 0.0 - 0.1 x10E3/uL  Comprehensive metabolic panel  Result Value Ref Range   Glucose 265 (H) 70 - 99 mg/dL   BUN 17 6 - 24 mg/dL   Creatinine, Ser 5.40 0.76 - 1.27 mg/dL   eGFR 96 >98 JX/BJY/7.82   BUN/Creatinine Ratio 18 9 - 20   Sodium 140 134 - 144 mmol/L   Potassium 4.7 3.5 - 5.2 mmol/L   Chloride 101 96 - 106 mmol/L   CO2 24 20 - 29 mmol/L   Calcium 10.1 8.7 - 10.2 mg/dL   Total Protein 6.9 6.0 - 8.5 g/dL   Albumin 4.6 3.8 - 4.9 g/dL   Globulin, Total 2.3 1.5 - 4.5 g/dL   Bilirubin Total 0.6 0.0 - 1.2 mg/dL   Alkaline Phosphatase 64 44 - 121 IU/L   AST 16 0 - 40 IU/L   ALT 19 0 - 44 IU/L  Magnesium  Result Value Ref Range   Magnesium 2.0 1.6 - 2.3 mg/dL  Lipid Panel w/o Chol/HDL Ratio  Result Value Ref Range   Cholesterol, Total 188 100 - 199 mg/dL   Triglycerides 956 (H) 0 - 149 mg/dL   HDL 41 >21 mg/dL   VLDL Cholesterol Cal 54 (H) 5 - 40 mg/dL   LDL Chol Calc (NIH) 93 0 - 99 mg/dL  TSH  Result Value Ref Range   TSH 0.721 0.450 - 4.500 uIU/mL  PSA  Result Value Ref Range   Prostate Specific Ag, Serum 1.2 0.0 - 4.0 ng/mL  Vitamin B12  Result Value Ref Range   Vitamin B-12 301 232 - 1,245 pg/mL  Hepatitis C antibody  Result Value Ref Range   Hep C Virus Ab Non Reactive Non Reactive      Assessment & Plan:   Problem List Items Addressed This Visit       Cardiovascular and Mediastinum   Hypertension associated with diabetes (HCC)    Chronic, ongoing.  Restarted Benazepril last visit and Matthew Collier is tolerating this better at night, continue to take at night.  BP at goal on recheck.  Urine ALB 80 August 2024, educated him on this.  Recommend Matthew Collier monitor BP at least a few mornings a week at home and  document.  DASH diet at home.  Continue current medication regimen and adjust as needed.  Labs today: up to date.           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  A1c upward trend to 12.9% in August due to poor medication compliance and loss of follow-up.  Discussed with patient at length risks of high A1c and diabetes to include vision impairment, stroke, MI, loss of limbs, poor healing wounds, kidney disease. Reiterated the importance of maintaining regular follow-up and that diabetes is a team approach, with patient being leader. - Continue Metformin 500 MG TID, Ozempic at 0.5 MG weekly and Jardiance 25 MG daily.  Is tolerating these and seeing blood sugars trend down at home.  Matthew Collier is aware that if these are too costly to alert provider and a referral to pharmacist can be placed.  Goal is to avoid insulin, which patient prefers to avoid as well. - Matthew Collier is to check sugars twice a day, supplied him with form, discussed goals of fasting <130  and 2 hours after meals <180 - Foot exam up to date.  Recommend Matthew Collier schedule eye exam. - Has had PPSV23, no flu shot, refuses. - Taking ACE and Statin        Follow up plan: Return in about 2 months (around 12/04/2022) for T2DM, HTN/HLD.

## 2023-01-03 ENCOUNTER — Ambulatory Visit: Payer: BC Managed Care – PPO | Admitting: Nurse Practitioner

## 2023-01-29 ENCOUNTER — Ambulatory Visit: Payer: Self-pay | Admitting: Nurse Practitioner

## 2023-05-21 ENCOUNTER — Telehealth: Payer: Self-pay

## 2023-05-21 NOTE — Telephone Encounter (Signed)
 PA for Ozempic  initiated and submitted via Cover My Meds. Key: Daleen Dubs

## 2023-05-22 NOTE — Telephone Encounter (Signed)
 PA approved. Called and LVM notifying patient of approval.

## 2023-07-15 LAB — HM DIABETES EYE EXAM

## 2023-09-17 ENCOUNTER — Other Ambulatory Visit: Payer: Self-pay | Admitting: Nurse Practitioner

## 2023-09-18 NOTE — Telephone Encounter (Signed)
 Patient is overdue for an appointment. Please call to schedule and then route to provider for refill.

## 2023-09-18 NOTE — Telephone Encounter (Signed)
 Requested medications are due for refill today.  yes  Requested medications are on the active medications list.  yes  Last refill. varied  Future visit scheduled.   no  Notes to clinic.  Pt is more than 3 months overdue for an OV.    Requested Prescriptions  Pending Prescriptions Disp Refills   JARDIANCE  25 MG TABS tablet [Pharmacy Med Name: JARDIANCE  25 MG TAB] 30 tablet 12    Sig: TAKE 1 TABLET BY MOUTH ONCE DAILY WITH BREAKFAST     Endocrinology:  Diabetes - SGLT2 Inhibitors Failed - 09/18/2023  2:38 PM      Failed - Cr in normal range and within 360 days    Creatinine, Ser  Date Value Ref Range Status  08/23/2022 0.92 0.76 - 1.27 mg/dL Final         Failed - HBA1C is between 0 and 7.9 and within 180 days    HB A1C (BAYER DCA - WAIVED)  Date Value Ref Range Status  08/23/2022 12.9 (H) 4.8 - 5.6 % Final    Comment:             Prediabetes: 5.7 - 6.4          Diabetes: >6.4          Glycemic control for adults with diabetes: <7.0          Failed - eGFR in normal range and within 360 days    GFR calc Af Amer  Date Value Ref Range Status  01/04/2020 117 >59 mL/min/1.73 Final    Comment:    **In accordance with recommendations from the NKF-ASN Task force,**   [facility] is in the process of updating its eGFR calculation to the   2021 CKD-EPI creatinine equation that estimates kidney function   without a race variable.    GFR, Estimated  Date Value Ref Range Status  05/18/2021 >60 >60 mL/min Final    Comment:    (NOTE) Calculated using the CKD-EPI Creatinine Equation (2021)    eGFR  Date Value Ref Range Status  08/23/2022 96 >59 mL/min/1.73 Final         Failed - Valid encounter within last 6 months    Recent Outpatient Visits   None             metFORMIN  (GLUCOPHAGE ) 500 MG tablet [Pharmacy Med Name: METFORMIN  HCL 500 MG TAB] 270 tablet 1    Sig: TAKE 1 TABLET BY MOUTH ONCE EVERY MORNING AT NOON AND AT BEDTIME     Endocrinology:  Diabetes - Biguanides  Failed - 09/18/2023  2:38 PM      Failed - Cr in normal range and within 360 days    Creatinine, Ser  Date Value Ref Range Status  08/23/2022 0.92 0.76 - 1.27 mg/dL Final         Failed - HBA1C is between 0 and 7.9 and within 180 days    HB A1C (BAYER DCA - WAIVED)  Date Value Ref Range Status  08/23/2022 12.9 (H) 4.8 - 5.6 % Final    Comment:             Prediabetes: 5.7 - 6.4          Diabetes: >6.4          Glycemic control for adults with diabetes: <7.0          Failed - eGFR in normal range and within 360 days    GFR calc Af Amer  Date Value Ref  Range Status  01/04/2020 117 >59 mL/min/1.73 Final    Comment:    **In accordance with recommendations from the NKF-ASN Task force,**   [facility] is in the process of updating its eGFR calculation to the   2021 CKD-EPI creatinine equation that estimates kidney function   without a race variable.    GFR, Estimated  Date Value Ref Range Status  05/18/2021 >60 >60 mL/min Final    Comment:    (NOTE) Calculated using the CKD-EPI Creatinine Equation (2021)    eGFR  Date Value Ref Range Status  08/23/2022 96 >59 mL/min/1.73 Final         Failed - Valid encounter within last 6 months    Recent Outpatient Visits   None            Failed - CBC within normal limits and completed in the last 12 months    WBC  Date Value Ref Range Status  08/23/2022 7.6 3.4 - 10.8 x10E3/uL Final  05/18/2021 4.9 4.0 - 10.5 K/uL Final   RBC  Date Value Ref Range Status  08/23/2022 5.42 4.14 - 5.80 x10E6/uL Final  05/18/2021 3.01 (L) 4.22 - 5.81 MIL/uL Final   Hemoglobin  Date Value Ref Range Status  08/23/2022 16.8 13.0 - 17.7 g/dL Final   Hematocrit  Date Value Ref Range Status  08/23/2022 50.3 37.5 - 51.0 % Final   MCHC  Date Value Ref Range Status  08/23/2022 33.4 31.5 - 35.7 g/dL Final  94/87/7976 64.5 30.0 - 36.0 g/dL Final   [facility]  Date Value Ref Range Status  08/23/2022 31.0 26.6 - 33.0 pg Final  05/18/2021 32.2 26.0 -  34.0 pg Final   MCV  Date Value Ref Range Status  08/23/2022 93 79 - 97 fL Final   No results found for: PLTCOUNTKUC, LABPLAT, POCPLA RDW  Date Value Ref Range Status  08/23/2022 11.9 11.6 - 15.4 % Final         Passed - B12 Level in normal range and within 720 days    Vitamin B-12  Date Value Ref Range Status  08/23/2022 301 232 - 1,245 pg/mL Final          OZEMPIC , 0.25 OR 0.5 MG/DOSE, 2 MG/3ML SOPN [Pharmacy Med Name: OZEMPIC  (0.25 OR 0.5 MG/DOSE) 2 MG/] 3 mL 12    Sig: INJECT 0.5 INTO THE SKIN ONCE SAME DAY EACH WEEK     Endocrinology:  Diabetes - GLP-1 Receptor Agonists - semaglutide  Failed - 09/18/2023  2:38 PM      Failed - HBA1C in normal range and within 180 days    HB A1C (BAYER DCA - WAIVED)  Date Value Ref Range Status  08/23/2022 12.9 (H) 4.8 - 5.6 % Final    Comment:             Prediabetes: 5.7 - 6.4          Diabetes: >6.4          Glycemic control for adults with diabetes: <7.0          Failed - Cr in normal range and within 360 days    Creatinine, Ser  Date Value Ref Range Status  08/23/2022 0.92 0.76 - 1.27 mg/dL Final         Failed - Valid encounter within last 6 months    Recent Outpatient Visits   None

## 2023-09-19 NOTE — Telephone Encounter (Signed)
 Called patient and left a message for him to call back to get scheduled an OV

## 2023-09-22 NOTE — Telephone Encounter (Signed)
 Called patient and left a message for him to call back to get scheduled.

## 2023-09-25 NOTE — Telephone Encounter (Signed)
 3rd attempt to reach patient to get him scheduled

## 2023-09-26 ENCOUNTER — Telehealth: Payer: Self-pay | Admitting: Nurse Practitioner

## 2023-09-26 NOTE — Telephone Encounter (Signed)
 Called to see if he has insurance but no answer. Left vm.Tlw

## 2023-10-01 ENCOUNTER — Ambulatory Visit: Payer: Self-pay | Admitting: Pediatrics

## 2023-10-01 ENCOUNTER — Encounter: Payer: Self-pay | Admitting: Pediatrics

## 2023-10-01 VITALS — BP 145/92 | HR 80 | Temp 97.5°F | Wt 211.8 lb

## 2023-10-01 DIAGNOSIS — E1169 Type 2 diabetes mellitus with other specified complication: Secondary | ICD-10-CM | POA: Diagnosis not present

## 2023-10-01 DIAGNOSIS — I152 Hypertension secondary to endocrine disorders: Secondary | ICD-10-CM

## 2023-10-01 DIAGNOSIS — E1165 Type 2 diabetes mellitus with hyperglycemia: Secondary | ICD-10-CM

## 2023-10-01 DIAGNOSIS — G8929 Other chronic pain: Secondary | ICD-10-CM

## 2023-10-01 DIAGNOSIS — E785 Hyperlipidemia, unspecified: Secondary | ICD-10-CM

## 2023-10-01 DIAGNOSIS — E119 Type 2 diabetes mellitus without complications: Secondary | ICD-10-CM

## 2023-10-01 DIAGNOSIS — Z7985 Long-term (current) use of injectable non-insulin antidiabetic drugs: Secondary | ICD-10-CM

## 2023-10-01 DIAGNOSIS — M25511 Pain in right shoulder: Secondary | ICD-10-CM

## 2023-10-01 DIAGNOSIS — E1159 Type 2 diabetes mellitus with other circulatory complications: Secondary | ICD-10-CM

## 2023-10-01 LAB — MICROALBUMIN, URINE WAIVED
Creatinine, Urine Waived: 100 mg/dL (ref 10–300)
Microalb, Ur Waived: 80 mg/L — ABNORMAL HIGH (ref 0–19)

## 2023-10-01 MED ORDER — OLMESARTAN MEDOXOMIL 20 MG PO TABS: 20.0000 mg | ORAL_TABLET | Freq: Every day | ORAL | 1 refills | Status: DC

## 2023-10-01 NOTE — Progress Notes (Signed)
 Office Visit  BP (!) 145/92   Pulse 80   Temp (!) 97.5 F (36.4 C) (Oral)   Wt 211 lb 12.8 oz (96.1 kg)   SpO2 98%   BMI 31.46 kg/m    Subjective:    Patient ID: Matthew Collier, male    DOB: 07/03/1965, 58 y.o.   MRN: [id number]  HPI: Matthew Collier is a 58 y.o. male  Chief Complaint  Patient presents with   Medication Refill    Pt picked medication requested up yesturday    #T2DM Has been on 0.5mg  ozempic , did not tolerate higher dose due to GI distress. He notes the lower dose has had variable readings Glucose readings have been 106-156 fasting, highest 220s Denies vision changes, increased urinary frequency, urgency, neuropathy, increased thirst. Reports good compliance w metformin   #HTN On lotensin  40mg  Has been feeling lightheaded on days he takes the medication so he intermittently stops it Interested in switching to another medication No headaches chest pain, sob, palpations, orthopnea, dyspnea.  #right shoulder Chronic issue, injured while at work years ago. Repetitive movements Limited ROM, wakes from sleep Seen by [facility] and did injection. Lasted about 1 mo.  He is due for f/u in novemeber but wondering if he can see someone faster due to upcoming insurance changes  Wt Readings from Last 3 Encounters:  10/01/23 211 lb 12.8 oz (96.1 kg)  10/04/22 198 lb 12.8 oz (90.2 kg)  08/23/22 205 lb 9.6 oz (93.3 kg)    Relevant past medical, surgical, family and social history reviewed and updated as indicated. Interim medical history since our last visit reviewed. Allergies and medications reviewed and updated.  ROS per HPI unless specifically indicated above     Objective:    BP (!) 145/92   Pulse 80   Temp (!) 97.5 F (36.4 C) (Oral)   Wt 211 lb 12.8 oz (96.1 kg)   SpO2 98%   BMI 31.46 kg/m   Wt Readings from Last 3 Encounters:  10/01/23 211 lb 12.8 oz (96.1 kg)  10/04/22 198 lb 12.8 oz (90.2 kg)  08/23/22 205 lb 9.6 oz (93.3 kg)     Physical  Exam Constitutional:      Appearance: Normal appearance.  Pulmonary:     Effort: Pulmonary effort is normal.  Musculoskeletal:     Right elbow: Decreased range of motion. Tenderness present.  Skin:    Comments: Normal skin color  Neurological:     General: No focal deficit present.     Mental Status: He is alert. Mental status is at baseline.  Psychiatric:        Mood and Affect: Mood normal.        Behavior: Behavior normal.        Thought Content: Thought content normal.         10/01/2023    9:38 AM 10/04/2022    2:15 PM 08/23/2022    3:54 PM 10/02/2021    2:16 PM 06/28/2021   10:00 AM  Depression screen PHQ 2/9  Decreased Interest 0 0 0 0 0  Down, Depressed, Hopeless 0 0 0 0 0  PHQ - 2 Score 0 0 0 0 0  Altered sleeping 1 1 0 0 1  Tired, decreased energy 0 1 0 0 0  Change in appetite 0 0 0 0 0  Feeling bad or failure about yourself  0 0 0 0 0  Trouble concentrating 0 0 0 0 0  Moving slowly or  fidgety/restless 0 0 0 0 0  Suicidal thoughts 0 0 0 0 0  PHQ-9 Score 1 2 0 0 1  Difficult doing work/chores Not difficult at all Not difficult at all Not difficult at all Not difficult at all Not difficult at all       10/01/2023    9:39 AM 10/04/2022    2:16 PM 08/23/2022    3:55 PM 10/02/2021    2:16 PM  GAD 7 : Generalized Anxiety Score  Nervous, Anxious, on Edge 0 0 0 0  Control/stop worrying 0 0 0 0  Worry too much - different things 0 0 0 0  Trouble relaxing 0 0 0 0  Restless 0 0 0 0  Easily annoyed or irritable 0 0 0 0  Afraid - awful might happen 0 0 0 0  Total GAD 7 Score 0 0 0 0  Anxiety Difficulty Not difficult at all Not difficult at all Not difficult at all Not difficult at all       Assessment & Plan:  Assessment & Plan   Hypertension associated with diabetes [facility]) Assessment & Plan: Outside goal today. He is reporting some lightheadedness on days where he takes medication (lotensin ) so intermittently not taking. He did take today. Plan to switch to  olmesartan  20mg  and reassess symptoms. Encouraged to get bp cuff and check at home, bring readings to f/u visit.  Orders: -     Olmesartan  Medoxomil; Take 1 tablet (20 mg total) by mouth daily.  Dispense: 90 tablet; Refill: 1  Hyperlipidemia associated with type 2 diabetes mellitus (HCC) Assessment & Plan: Repeat levels today. Reports good compliance of simvastatin  40mg .   Orders: -     Lipid panel  Type 2 diabetes mellitus with hyperglycemia, without long-term current use of insulin  (HCC) Diabetes mellitus treated with injections of non-insulin  medication (HCC) Assessment & Plan: Reports intolerability of higher dose ozempic  but interested in better glucose control. Plan to trial on mounjaro sample 2.5mg . Will let us  know if tolerating better and will switch if so. Labs due as below. Did not have time for foot exam but will do next visit.  Orders: -     Hemoglobin A1c -     Comprehensive metabolic panel with GFR -     Microalbumin, Urine Waived  Chronic right shoulder pain Suspect rotator cuff severe tendinopathy vs tear based on degree of pain and limited ROM. Plan to refer to ortho to try to move up follow up visit (scheduled for November). -     Ambulatory referral to Orthopedic Surgery    Follow up plan: Return in about 4 weeks (around 10/29/2023) for HTN, DM.  Hadassah SHAUNNA Nett, MD

## 2023-10-01 NOTE — Patient Instructions (Addendum)
 Tylenol  extra strength- take take 1-2 as neededup to three times a day Ibuprofen 200-400mg  three a times a day  New blood pressure medication: olmesartan  20mg  daily  Stop ozempic  and try mounjaro whenever you are due for your next injection. If going well, let me know and can submit for insurance  Measure your blood pressure at home! Home Blood Pressure Monitoring is an important part of managing blood pressure and thought to be more accurate than the measures we get in the clinic.  Here's some tips on how to take your blood pressure accurately at home and some highly rated monitors. Most insurances (except for Medicaid) won't pay for monitors, so unfortunately they are an out-of-pocket expense for most people.  Taking an accurate blood pressure measurement: To get an accurate blood pressure reading, empty your bladder first, then rest in a seated position for at least 5 minutes. Ideally, no caffeine or tobacco use in last 30 minutes. Use an arm cuff (not wrist - see recommendations below) seated in a chair with a back next to a table or object that is high enough that you can rest your arm so the blood pressure cuff is at the level of your heart and you can lean back comfortably. Keep both feet on the floor and don't talk while the machine is working. Checking at different times of the day can be helpful to get an idea of your average numbers. Your goal blood pressure should be <140/90. BP Monitor Ratings: Here are some top rated Blood Pressure Monitors as tested by Consumer Reports (accessed January, 2024): (*BB) Best buy = highly rated with lower price Rating Brand/Model Estimated Cost  86 Omron Platinum BP5450 $79  85 Omron Silver BP5250 (*BB)  $53  84 Omron 10 Series BP7450 $92  83 Omron Evolv BP7000 $110  81 [employer] [id number] $52  80 Omron 3 Series BP7100 $50  78 [employer] [id number] $40  76 Up & Up ([employer]) Automatic Upper Arm 48-554 $30  Note: all units listed above are arm cuffs which are  more accurate than wrist cuffs. For more information (and the source of the below info-graphic on how to get set up to get an accurate reading) - check out: [employer]be

## 2023-10-01 NOTE — Assessment & Plan Note (Addendum)
 Reports intolerability of higher dose ozempic  but interested in better glucose control. Plan to trial on mounjaro sample 2.5mg . Will let us  know if tolerating better and will switch if so. Labs due as below. Did not have time for foot exam but will do next visit.

## 2023-10-01 NOTE — Assessment & Plan Note (Signed)
 Repeat levels today. Reports good compliance of simvastatin  40mg .

## 2023-10-01 NOTE — Assessment & Plan Note (Addendum)
 Outside goal today. He is reporting some lightheadedness on days where he takes medication (lotensin ) so intermittently not taking. He did take today. Plan to switch to olmesartan  20mg  and reassess symptoms. Encouraged to get bp cuff and check at home, bring readings to f/u visit.

## 2023-10-02 LAB — COMPREHENSIVE METABOLIC PANEL WITH GFR
ALT: 31 IU/L (ref 0–44)
AST: 21 IU/L (ref 0–40)
Albumin: 4.6 g/dL (ref 3.8–4.9)
Alkaline Phosphatase: 64 IU/L (ref 47–123)
BUN/Creatinine Ratio: 23 (ref 10–24)
BUN: 16 mg/dL (ref 8–27)
Bilirubin Total: 0.6 mg/dL (ref 0.0–1.2)
CO2: 20 mmol/L (ref 20–29)
Calcium: 9.3 mg/dL (ref 8.6–10.2)
Chloride: 100 mmol/L (ref 96–106)
Creatinine, Ser: 0.71 mg/dL — ABNORMAL LOW (ref 0.76–1.27)
Globulin, Total: 2.3 g/dL (ref 1.5–4.5)
Glucose: 156 mg/dL — ABNORMAL HIGH (ref 70–99)
Potassium: 4.4 mmol/L (ref 3.5–5.2)
Sodium: 138 mmol/L (ref 134–144)
Total Protein: 6.9 g/dL (ref 6.0–8.5)
eGFR: 105 mL/min/1.73 (ref >59)

## 2023-10-02 LAB — HEMOGLOBIN A1C
Est. average glucose Bld gHb Est-mCnc: 192 mg/dL
Hgb A1c MFr Bld: 8.3 % — ABNORMAL HIGH (ref 4.8–5.6)

## 2023-10-02 LAB — LIPID PANEL
Chol/HDL Ratio: 5.4 ratio — ABNORMAL HIGH (ref 0.0–5.0)
Cholesterol, Total: 190 mg/dL (ref 100–199)
HDL: 35 mg/dL — ABNORMAL LOW (ref >39)
LDL Chol Calc (NIH): 82 mg/dL (ref 0–99)
Triglycerides: 455 mg/dL — ABNORMAL HIGH (ref 0–149)
VLDL Cholesterol Cal: 73 mg/dL — ABNORMAL HIGH (ref 5–40)

## 2023-10-08 ENCOUNTER — Ambulatory Visit

## 2023-10-26 NOTE — Patient Instructions (Incomplete)

## 2023-10-29 ENCOUNTER — Ambulatory Visit: Admitting: Nurse Practitioner

## 2023-10-29 DIAGNOSIS — E669 Obesity, unspecified: Secondary | ICD-10-CM

## 2023-11-10 ENCOUNTER — Encounter: Payer: Self-pay | Admitting: Radiology

## 2023-11-15 NOTE — Patient Instructions (Signed)

## 2023-11-17 ENCOUNTER — Ambulatory Visit: Admitting: Nurse Practitioner

## 2023-11-17 ENCOUNTER — Encounter: Payer: Self-pay | Admitting: Nurse Practitioner

## 2023-11-17 VITALS — BP 146/94 | HR 80 | Temp 98.9°F | Resp 15 | Ht 68.82 in | Wt 213.6 lb

## 2023-11-17 DIAGNOSIS — E1159 Type 2 diabetes mellitus with other circulatory complications: Secondary | ICD-10-CM

## 2023-11-17 DIAGNOSIS — E669 Obesity, unspecified: Secondary | ICD-10-CM

## 2023-11-17 DIAGNOSIS — E785 Hyperlipidemia, unspecified: Secondary | ICD-10-CM

## 2023-11-17 DIAGNOSIS — Z7985 Long-term (current) use of injectable non-insulin antidiabetic drugs: Secondary | ICD-10-CM

## 2023-11-17 DIAGNOSIS — E1169 Type 2 diabetes mellitus with other specified complication: Secondary | ICD-10-CM | POA: Diagnosis not present

## 2023-11-17 DIAGNOSIS — E119 Type 2 diabetes mellitus without complications: Secondary | ICD-10-CM

## 2023-11-17 DIAGNOSIS — I152 Hypertension secondary to endocrine disorders: Secondary | ICD-10-CM

## 2023-11-17 MED ORDER — OLMESARTAN MEDOXOMIL 40 MG PO TABS: 40.0000 mg | ORAL_TABLET | Freq: Every day | ORAL | 3 refills | Status: AC

## 2023-11-17 MED ORDER — TIRZEPATIDE 5 MG/0.5ML ~~LOC~~ SOAJ: 5.0000 mg | SUBCUTANEOUS | 3 refills | Status: DC

## 2023-11-17 MED ORDER — SIMVASTATIN 40 MG PO TABS: 40.0000 mg | ORAL_TABLET | Freq: Every day | ORAL | 4 refills | Status: AC

## 2023-11-17 NOTE — Assessment & Plan Note (Signed)
 Chronic, ongoing.  A1c 8.3% in September and tolerating change to Mounjaro.  Discussed with patient at length risks of high A1c and diabetes to include vision impairment, stroke, MI, loss of limbs, poor healing wounds, kidney disease. Reiterated the importance of maintaining regular follow-up and that diabetes is a team approach, with patient being leader. - Will trial increasing Mounjaro to 5 MG weekly. Continue Metformin  500 MG TID and Jardiance  25 MG daily.  Is tolerating these and seeing blood sugars trend down at home with addition of Mounjaro.  Matthew Collier is aware that if these are too costly to alert provider and a referral to pharmacist can be placed.  Goal is to avoid insulin , which patient prefers to avoid as well. - Matthew Collier is to check sugars twice a day, discussed goals of fasting <130 and 2 hours after meals <180 - Foot and eye exams up to date. - Has had PPSV23, no flu shot, refuses. - Taking ARB and Statin

## 2023-11-17 NOTE — Progress Notes (Signed)
 BP (!) 146/94 (BP Location: Left Arm, Patient Position: Sitting, Cuff Size: Normal)   Pulse 80   Temp 98.9 F (37.2 C) (Oral)   Resp 15   Ht 5' 8.82 (1.748 m)   Wt 213 lb 9.6 oz (96.9 kg)   SpO2 100%   BMI 31.71 kg/m    Subjective:    Patient ID: Matthew Collier, male    DOB: 02-11-1965, 58 y.o.   MRN: [id number]  HPI: Matthew Collier is a 58 y.o. male  Chief Complaint  Patient presents with   Diabetes    Feels it has come down some. Morning fasting in the 100's now compared to the 200's prior. Had the same GI uneasy with Ozempic  as Mounjaro, would like to see about switching since it was not as bad really.    Hypertension    Using home monitor. Check last night had it 140/80. No lightheadness since stopping the Losartan now using Olmesartan .    DIABETES A1c 8.3% September and Mounjaro was started at 2.5 MG weekly. He feels he is tolerating this better than Ozempic  and is seeing sugars come down.  Continues Jardiance  and Metformin . Hypoglycemic episodes:no Polydipsia/polyuria: no Visual disturbance: no Chest pain: no Paresthesias: no Glucose Monitoring: yes  Accucheck frequency: Daily  Fasting glucose: 157 this morning  Post prandial:  Evening:  Before meals: Taking Insulin ?: no  Long acting insulin :  Short acting insulin : Blood Pressure Monitoring: weekly Retinal Examination: Up to Date Foot Exam: Up to Date Diabetic Education: Not Completed Pneumovax: Refused Influenza: Refused Aspirin : no   HYPERTENSION without Chronic Kidney Disease Taking Olmesartan  20 MG. Hypertension status: uncontrolled  Satisfied with current treatment? yes Duration of hypertension: chronic BP monitoring frequency:  weekly BP range: 140/90 range BP medication side effects:  no Medication compliance: good compliance Aspirin : no Recurrent headaches: no Visual changes: no Palpitations: no Dyspnea: no Chest pain: no Lower extremity edema: no Dizzy/lightheaded: no   Relevant past  medical, surgical, family and social history reviewed and updated as indicated. Interim medical history since our last visit reviewed. Allergies and medications reviewed and updated.  Review of Systems  Constitutional:  Negative for activity change, diaphoresis, fatigue and fever.  Respiratory:  Negative for cough, chest tightness, shortness of breath and wheezing.   Cardiovascular:  Negative for chest pain, palpitations and leg swelling.  Gastrointestinal: Negative.   Endocrine: Negative for cold intolerance, heat intolerance, polydipsia, polyphagia and polyuria.  Neurological: Negative.   Psychiatric/Behavioral: Negative.      Per HPI unless specifically indicated above     Objective:    BP (!) 146/94 (BP Location: Left Arm, Patient Position: Sitting, Cuff Size: Normal)   Pulse 80   Temp 98.9 F (37.2 C) (Oral)   Resp 15   Ht 5' 8.82 (1.748 m)   Wt 213 lb 9.6 oz (96.9 kg)   SpO2 100%   BMI 31.71 kg/m   Wt Readings from Last 3 Encounters:  11/17/23 213 lb 9.6 oz (96.9 kg)  10/01/23 211 lb 12.8 oz (96.1 kg)  10/04/22 198 lb 12.8 oz (90.2 kg)    Physical Exam Vitals and nursing note reviewed.  Constitutional:      General: He is awake. He is not in acute distress.    Appearance: He is well-developed and well-groomed. He is obese. He is not ill-appearing or toxic-appearing.  HENT:     Head: Normocephalic.     Right Ear: Hearing and external ear normal.     Left  Ear: Hearing and external ear normal.  Eyes:     General: Lids are normal.     Extraocular Movements: Extraocular movements intact.     Conjunctiva/sclera: Conjunctivae normal.  Neck:     Thyroid : No thyromegaly.     Vascular: No carotid bruit.  Cardiovascular:     Rate and Rhythm: Normal rate and regular rhythm.     Heart sounds: Normal heart sounds. No murmur heard.    No gallop.  Pulmonary:     Effort: No accessory muscle usage or respiratory distress.     Breath sounds: Normal breath sounds.   Abdominal:     General: Bowel sounds are normal. There is no distension.     Palpations: Abdomen is soft.     Tenderness: There is no abdominal tenderness.  Musculoskeletal:     Cervical back: Full passive range of motion without pain.     Right lower leg: No edema.     Left lower leg: No edema.  Lymphadenopathy:     Cervical: No cervical adenopathy.  Skin:    General: Skin is warm.     Capillary Refill: Capillary refill takes less than 2 seconds.  Neurological:     Mental Status: He is alert and oriented to person, place, and time.     Deep Tendon Reflexes: Reflexes are normal and symmetric.     Reflex Scores:      Brachioradialis reflexes are 2+ on the right side and 2+ on the left side.      Patellar reflexes are 2+ on the right side and 2+ on the left side. Psychiatric:        Attention and Perception: Attention normal.        Mood and Affect: Mood normal.        Speech: Speech normal.        Behavior: Behavior normal. Behavior is cooperative.        Thought Content: Thought content normal.    Diabetic Foot Exam - Simple   Simple Foot Form Visual Inspection No deformities, no ulcerations, no other skin breakdown bilaterally: Yes Sensation Testing Intact to touch and monofilament testing bilaterally: Yes Pulse Check Posterior Tibialis and Dorsalis pulse intact bilaterally: Yes Comments     Results for orders placed or performed in visit on 10/01/23  Microalbumin, Urine Waived (STAT)   Collection Time: 10/01/23 10:02 AM  Result Value Ref Range   Microalb, Ur Waived 80 (H) 0 - 19 mg/L   Creatinine, Urine Waived 100 10 - 300 mg/dL   Microalb/Creat Ratio 30-300 (H) <30 mg/g  Lipid Profile   Collection Time: 10/01/23 10:03 AM  Result Value Ref Range   Cholesterol, Total 190 100 - 199 mg/dL   Triglycerides 544 (H) 0 - 149 mg/dL   HDL 35 (L) >60 mg/dL   VLDL Cholesterol Cal 73 (H) 5 - 40 mg/dL   LDL Chol Calc (NIH) 82 0 - 99 mg/dL   Chol/HDL Ratio 5.4 (H) 0.0 - 5.0  ratio  HgB A1c   Collection Time: 10/01/23 10:03 AM  Result Value Ref Range   Hgb A1c MFr Bld 8.3 (H) 4.8 - 5.6 %   Est. average glucose Bld gHb Est-mCnc 192 mg/dL  Comp Met (CMET)   Collection Time: 10/01/23 10:03 AM  Result Value Ref Range   Glucose 156 (H) 70 - 99 mg/dL   BUN 16 8 - 27 mg/dL   Creatinine, Ser 9.28 (L) 0.76 - 1.27 mg/dL   eGFR 894 >40  mL/min/1.73   BUN/Creatinine Ratio 23 10 - 24   Sodium 138 134 - 144 mmol/L   Potassium 4.4 3.5 - 5.2 mmol/L   Chloride 100 96 - 106 mmol/L   CO2 20 20 - 29 mmol/L   Calcium 9.3 8.6 - 10.2 mg/dL   Total Protein 6.9 6.0 - 8.5 g/dL   Albumin 4.6 3.8 - 4.9 g/dL   Globulin, Total 2.3 1.5 - 4.5 g/dL   Bilirubin Total 0.6 0.0 - 1.2 mg/dL   Alkaline Phosphatase 64 47 - 123 IU/L   AST 21 0 - 40 IU/L   ALT 31 0 - 44 IU/L      Assessment & Plan:   Problem List Items Addressed This Visit       Cardiovascular and Mediastinum   Hypertension associated with diabetes (HCC)   Chronic, ongoing. BP above goal in office today.  Will increase Olmesartan  to 40 MG daily.  Urine ALB 80 September 2025, educated him on this.  Recommend he monitor BP at least a few mornings a week at home and document.  DASH diet at home. Labs today: up to date. Could consider addition of Amlodipine in combo pill in future if needed.       Relevant Medications   tirzepatide (MOUNJARO) 5 MG/0.5ML Pen   olmesartan  (BENICAR ) 40 MG tablet   simvastatin  (ZOCOR ) 40 MG tablet     Endocrine   Type 2 diabetes mellitus in patient with obesity (HCC) - Primary   Chronic, ongoing.  A1c 8.3% in September and tolerating change to Mounjaro.  Discussed with patient at length risks of high A1c and diabetes to include vision impairment, stroke, MI, loss of limbs, poor healing wounds, kidney disease. Reiterated the importance of maintaining regular follow-up and that diabetes is a team approach, with patient being leader. - Will trial increasing Mounjaro to 5 MG weekly. Continue  Metformin  500 MG TID and Jardiance  25 MG daily.  Is tolerating these and seeing blood sugars trend down at home with addition of Mounjaro.  He is aware that if these are too costly to alert provider and a referral to pharmacist can be placed.  Goal is to avoid insulin , which patient prefers to avoid as well. - He is to check sugars twice a day, discussed goals of fasting <130 and 2 hours after meals <180 - Foot and eye exams up to date. - Has had PPSV23, no flu shot, refuses. - Taking ARB and Statin      Relevant Medications   tirzepatide (MOUNJARO) 5 MG/0.5ML Pen   olmesartan  (BENICAR ) 40 MG tablet   simvastatin  (ZOCOR ) 40 MG tablet   Hyperlipidemia associated with type 2 diabetes mellitus (HCC)   Relevant Medications   tirzepatide (MOUNJARO) 5 MG/0.5ML Pen   olmesartan  (BENICAR ) 40 MG tablet   simvastatin  (ZOCOR ) 40 MG tablet     Follow up plan: Return in about 7 weeks (around 01/05/2024) for T2DM, HTN/HLD.

## 2023-11-17 NOTE — Assessment & Plan Note (Signed)
 Chronic, ongoing. BP above goal in office today.  Will increase Olmesartan  to 40 MG daily.  Urine ALB 80 September 2025, educated him on this.  Recommend he monitor BP at least a few mornings a week at home and document.  DASH diet at home. Labs today: up to date. Could consider addition of Amlodipine in combo pill in future if needed.

## 2023-11-18 ENCOUNTER — Other Ambulatory Visit (HOSPITAL_COMMUNITY): Payer: Self-pay

## 2023-11-18 ENCOUNTER — Telehealth: Payer: Self-pay

## 2023-11-18 NOTE — Telephone Encounter (Signed)
 Pharmacy Patient Advocate Encounter   Received notification from Onbase that prior authorization for Mounjaro 5MG /0.5ML auto-injectors is required/requested.   Insurance verification completed.   The patient is insured through [facility].   Per test claim: PA required; PA submitted to above mentioned insurance via Latent Key/confirmation #/EOC [id number] Status is pending

## 2023-11-21 ENCOUNTER — Other Ambulatory Visit (HOSPITAL_COMMUNITY): Payer: Self-pay

## 2023-11-21 NOTE — Telephone Encounter (Signed)
 Pharmacy Patient Advocate Encounter  Received notification from [facility] that Prior Authorization for Mounjaro 5MG /0.5ML auto-injectors has been APPROVED from 11/18/2023 to 11/18/2026. Ran test claim, Copay is $30.00. This test claim was processed through [facility]- copay amounts may vary at other pharmacies due to pharmacy/plan contracts, or as the patient moves through the different stages of their insurance plan.   PA #/Case ID/Reference #: [id number]

## 2024-01-03 NOTE — Patient Instructions (Signed)

## 2024-01-05 ENCOUNTER — Encounter: Payer: Self-pay | Admitting: Nurse Practitioner

## 2024-01-05 ENCOUNTER — Ambulatory Visit: Admitting: Nurse Practitioner

## 2024-01-05 VITALS — BP 132/78 | HR 96 | Temp 97.7°F | Resp 17 | Ht 68.82 in | Wt 214.6 lb

## 2024-01-05 DIAGNOSIS — E1169 Type 2 diabetes mellitus with other specified complication: Secondary | ICD-10-CM | POA: Diagnosis not present

## 2024-01-05 DIAGNOSIS — E1159 Type 2 diabetes mellitus with other circulatory complications: Secondary | ICD-10-CM | POA: Diagnosis not present

## 2024-01-05 DIAGNOSIS — E6609 Other obesity due to excess calories: Secondary | ICD-10-CM | POA: Diagnosis not present

## 2024-01-05 DIAGNOSIS — E785 Hyperlipidemia, unspecified: Secondary | ICD-10-CM

## 2024-01-05 DIAGNOSIS — E669 Obesity, unspecified: Secondary | ICD-10-CM | POA: Diagnosis not present

## 2024-01-05 DIAGNOSIS — E66811 Obesity, class 1: Secondary | ICD-10-CM

## 2024-01-05 DIAGNOSIS — Z7985 Long-term (current) use of injectable non-insulin antidiabetic drugs: Secondary | ICD-10-CM

## 2024-01-05 DIAGNOSIS — K2270 Barrett's esophagus without dysplasia: Secondary | ICD-10-CM

## 2024-01-05 DIAGNOSIS — N400 Enlarged prostate without lower urinary tract symptoms: Secondary | ICD-10-CM

## 2024-01-05 DIAGNOSIS — E538 Deficiency of other specified B group vitamins: Secondary | ICD-10-CM | POA: Diagnosis not present

## 2024-01-05 DIAGNOSIS — I152 Hypertension secondary to endocrine disorders: Secondary | ICD-10-CM

## 2024-01-05 DIAGNOSIS — E119 Type 2 diabetes mellitus without complications: Secondary | ICD-10-CM

## 2024-01-05 LAB — BAYER DCA HB A1C WAIVED: HB A1C (BAYER DCA - WAIVED): 6.8 % — ABNORMAL HIGH (ref 4.8–5.6)

## 2024-01-05 MED ORDER — METFORMIN HCL 500 MG PO TABS: ORAL_TABLET | ORAL | 3 refills | Status: AC

## 2024-01-05 MED ORDER — TIRZEPATIDE 7.5 MG/0.5ML ~~LOC~~ SOAJ: 7.5000 mg | SUBCUTANEOUS | 4 refills | Status: AC

## 2024-01-05 MED ORDER — PANTOPRAZOLE SODIUM 40 MG PO TBEC: 40.0000 mg | DELAYED_RELEASE_TABLET | Freq: Every day | ORAL | 4 refills | Status: AC

## 2024-01-05 NOTE — Assessment & Plan Note (Signed)
BMI 31.86.  Recommended eating smaller high protein, low fat meals more frequently and exercising 30 mins a day 5 times a week with a goal of 10-15lb weight loss in the next 3 months. Patient voiced their understanding and motivation to adhere to these recommendations.

## 2024-01-05 NOTE — Assessment & Plan Note (Signed)
 Refer to diabetes with obesity plan of care.

## 2024-01-05 NOTE — Assessment & Plan Note (Signed)
 Chronic, ongoing. BP above goal in office today, but improving on recheck.  Will continue Olmesartan  40 MG daily.  Urine ALB 80 September 2025, educated him on this.  Recommend he monitor BP at least a few mornings a week at home and document.  DASH diet at home. Labs today: CBC, CMP, TSH. Could consider addition of Amlodipine in combo pill in future if needed.

## 2024-01-05 NOTE — Assessment & Plan Note (Signed)
 Chronic, ongoing.  Continue current medication regimen and adjust as needed.  Lipid panel today.  Recommend he consistently take statin for most benefit.  Educated him on the importance of statin use in diabetes and risks if not consistently taken.

## 2024-01-05 NOTE — Progress Notes (Signed)
 "  BP 132/78 (BP Location: Left Arm, Patient Position: Sitting, Cuff Size: Normal)   Pulse 96   Temp 97.7 F (36.5 C) (Oral)   Resp 17   Ht 5' 8.82 (1.748 m)   Wt 214 lb 9.6 oz (97.3 kg)   SpO2 97%   BMI 31.86 kg/m    Subjective:    Patient ID: Matthew Collier, male    DOB: [birth year], 58 y.o.   MRN: [id number]  HPI: Matthew Collier is a 58 y.o. male  Chief Complaint  Patient presents with   Follow-up    Here to get A1C checked, no major concerns   DIABETES A1c September was 8.3%. Currently takes Mounjaro  5 MG weekly (tolerates this okay, occasional stomach upset after 1st couple days after shot), Metformin  500 MG TID (at times taking 2 in the evening), and Jardiance  25 MG daily. History of low B12.  HISTORY: In past took Glipizide , Ozempic , Xigduo  (had tolerated this, but became too costly). Reports in past when he consistently took Glipizide  regularly it caused sugars in 60's.   Hypoglycemic episodes:no Polydipsia/polyuria: no Visual disturbance: no Chest pain: no Paresthesias: no Glucose Monitoring: yes             Accucheck frequency: 3 times a week             Fasting glucose: 140 to 150 - this morning was 155             Post prandial:              Evening:             Before meals: Taking Insulin ?: no             Long acting insulin :             Short acting insulin : Blood Pressure Monitoring: not checking Retinal Examination: Up to Date - [place] Foot Exam: Up to Date Pneumovax: Up To Date in 2007, but has not had PCV20 Influenza: refused - last in 2017 Aspirin : no   HYPERTENSION / HYPERLIPIDEMIA Taking Simvastatin  and Olmesartan . Satisfied with current treatment? yes Duration of hypertension: chronic BP monitoring frequency: not checking BP range:  BP medication side effects: no Duration of hyperlipidemia: chronic Cholesterol medication side effects: no Cholesterol supplements: none Medication compliance: good compliance Aspirin : yes Recent stressors:  no Recurrent headaches: no Visual changes: no Palpitations: no Dyspnea: no Chest pain: no Lower extremity edema: no Dizzy/lightheaded: no The 10-year ASCVD risk score (Arnett DK, et al., 2019) is: 23.5%   Values used to calculate the score:     Age: 40 years     Clinically relevant sex: Male     Is Non-Hispanic African American: No     Diabetic: Yes     Tobacco smoker: No     Systolic Blood Pressure: 132 mmHg     Is BP treated: Yes     HDL Cholesterol: 35 mg/dL     Total Cholesterol: 190 mg/dL  GERD Continues Protonix , has Barrett's esophagus. GERD control status: controlled Satisfied with current treatment? no Heartburn frequency: intermittent, takes TUMS as needed Medication side effects: no  Medication compliance: stable Antacid use frequency:  daily Dysphagia: no Odynophagia:  no Hematemesis: no Blood in stool: no EGD: yes   Relevant past medical, surgical, family and social history reviewed and updated as indicated. Interim medical history since our last visit reviewed. Allergies and medications reviewed and updated.  Review of Systems  Constitutional:  Negative  for activity change, diaphoresis, fatigue and fever.  Respiratory:  Negative for cough, chest tightness, shortness of breath and wheezing.   Cardiovascular:  Negative for chest pain, palpitations and leg swelling.  Gastrointestinal: Negative.   Endocrine: Negative for cold intolerance, heat intolerance, polydipsia, polyphagia and polyuria.  Neurological: Negative.   Psychiatric/Behavioral: Negative.     Per HPI unless specifically indicated above     Objective:    BP 132/78 (BP Location: Left Arm, Patient Position: Sitting, Cuff Size: Normal)   Pulse 96   Temp 97.7 F (36.5 C) (Oral)   Resp 17   Ht 5' 8.82 (1.748 m)   Wt 214 lb 9.6 oz (97.3 kg)   SpO2 97%   BMI 31.86 kg/m   Wt Readings from Last 3 Encounters:  01/05/24 214 lb 9.6 oz (97.3 kg)  11/17/23 213 lb 9.6 oz (96.9 kg)  10/01/23 211  lb 12.8 oz (96.1 kg)    Physical Exam Vitals and nursing note reviewed.  Constitutional:      General: He is awake. He is not in acute distress.    Appearance: He is well-developed and well-groomed. He is obese. He is not ill-appearing or toxic-appearing.  HENT:     Head: Normocephalic.     Right Ear: Hearing and external ear normal.     Left Ear: Hearing and external ear normal.  Eyes:     General: Lids are normal.     Extraocular Movements: Extraocular movements intact.     Conjunctiva/sclera: Conjunctivae normal.  Neck:     Thyroid : No thyromegaly.     Vascular: No carotid bruit.  Cardiovascular:     Rate and Rhythm: Normal rate and regular rhythm.     Heart sounds: Normal heart sounds. No murmur heard.    No gallop.  Pulmonary:     Effort: No accessory muscle usage or respiratory distress.     Breath sounds: Normal breath sounds. No decreased breath sounds, wheezing or rales.  Abdominal:     General: Bowel sounds are normal. There is no distension.     Palpations: Abdomen is soft.     Tenderness: There is no abdominal tenderness.  Musculoskeletal:     Cervical back: Full passive range of motion without pain.     Right lower leg: No edema.     Left lower leg: No edema.  Lymphadenopathy:     Cervical: No cervical adenopathy.  Skin:    General: Skin is warm.     Capillary Refill: Capillary refill takes less than 2 seconds.  Neurological:     Mental Status: He is alert and oriented to person, place, and time.     Deep Tendon Reflexes: Reflexes are normal and symmetric.     Reflex Scores:      Brachioradialis reflexes are 2+ on the right side and 2+ on the left side.      Patellar reflexes are 2+ on the right side and 2+ on the left side. Psychiatric:        Attention and Perception: Attention normal.        Mood and Affect: Mood normal.        Speech: Speech normal.        Behavior: Behavior normal. Behavior is cooperative.        Thought Content: Thought content  normal.    Results for orders placed or performed in visit on 10/01/23  Microalbumin, Urine Waived (STAT)   Collection Time: 10/01/23 10:02 AM  Result Value Ref  Range   Microalb, Ur Waived 80 (H) 0 - 19 mg/L   Creatinine, Urine Waived 100 10 - 300 mg/dL   Microalb/Creat Ratio 30-300 (H) <30 mg/g  Lipid Profile   Collection Time: 10/01/23 10:03 AM  Result Value Ref Range   Cholesterol, Total 190 100 - 199 mg/dL   Triglycerides 544 (H) 0 - 149 mg/dL   HDL 35 (L) >60 mg/dL   VLDL Cholesterol Cal 73 (H) 5 - 40 mg/dL   LDL Chol Calc (NIH) 82 0 - 99 mg/dL   Chol/HDL Ratio 5.4 (H) 0.0 - 5.0 ratio  HgB A1c   Collection Time: 10/01/23 10:03 AM  Result Value Ref Range   Hgb A1c MFr Bld 8.3 (H) 4.8 - 5.6 %   Est. average glucose Bld gHb Est-mCnc 192 mg/dL  Comp Met (CMET)   Collection Time: 10/01/23 10:03 AM  Result Value Ref Range   Glucose 156 (H) 70 - 99 mg/dL   BUN 16 8 - 27 mg/dL   Creatinine, Ser 9.28 (L) 0.76 - 1.27 mg/dL   eGFR 894 >40 fO/fpw/8.26   BUN/Creatinine Ratio 23 10 - 24   Sodium 138 134 - 144 mmol/L   Potassium 4.4 3.5 - 5.2 mmol/L   Chloride 100 96 - 106 mmol/L   CO2 20 20 - 29 mmol/L   Calcium 9.3 8.6 - 10.2 mg/dL   Total Protein 6.9 6.0 - 8.5 g/dL   Albumin 4.6 3.8 - 4.9 g/dL   Globulin, Total 2.3 1.5 - 4.5 g/dL   Bilirubin Total 0.6 0.0 - 1.2 mg/dL   Alkaline Phosphatase 64 47 - 123 IU/L   AST 21 0 - 40 IU/L   ALT 31 0 - 44 IU/L      Assessment & Plan:   Problem List Items Addressed This Visit       Cardiovascular and Mediastinum   Hypertension associated with diabetes (HCC)   Chronic, ongoing. BP above goal in office today, but improving on recheck.  Will continue Olmesartan  40 MG daily.  Urine ALB 80 September 2025, educated him on this.  Recommend he monitor BP at least a few mornings a week at home and document.  DASH diet at home. Labs today: CBC, CMP, TSH. Could consider addition of Amlodipine in combo pill in future if needed.       Relevant  Medications   tirzepatide  (MOUNJARO ) 7.5 MG/0.5ML Pen   metFORMIN  (GLUCOPHAGE ) 500 MG tablet   Other Relevant Orders   Bayer DCA Hb A1c Waived   CBC with Differential/Platelet   Comprehensive metabolic panel with GFR   TSH     Digestive   Barrett's esophagus without dysplasia   Chronic, ongoing.  Followed by GI in past. Continue Protonix  at 40 MG daily and obtain Mag level today.  Adjust regimen as needed.  Risks of PPI use were discussed with patient including bone loss, C. Diff diarrhea, pneumonia, infections, CKD, electrolyte abnormalities.  Verbalizes understanding and chooses to continue the medication.       Relevant Orders   Magnesium     Endocrine   Type 2 diabetes mellitus in patient with obesity (HCC) - Primary   Chronic, ongoing.  A1c 6.8% today, trend down from 8.3% previous and tolerating change to Mounjaro .  Discussed with patient at length risks of high A1c and diabetes to include vision impairment, stroke, MI, loss of limbs, poor healing wounds, kidney disease. Reiterated the importance of maintaining regular follow-up and that diabetes is a team approach,  with patient being leader. - Will trial increasing Mounjaro  to 7.5 MG weekly. Continue Metformin  500 MG TID and Jardiance  25 MG daily.  Is tolerating these and seeing blood sugars trend down at home with addition of Mounjaro .  He is aware that if these are too costly to alert provider and a referral to pharmacist can be placed.  Goal is to avoid insulin , which patient prefers to avoid as well. - He is to check sugars twice a day, discussed goals of fasting <130 and 2 hours after meals <180 - Foot and eye exams up to date. - Has had PPSV23, no flu shot, refuses. Would benefit PCV20 in future. - Taking ARB and Statin      Relevant Medications   tirzepatide  (MOUNJARO ) 7.5 MG/0.5ML Pen   metFORMIN  (GLUCOPHAGE ) 500 MG tablet   Other Relevant Orders   Bayer DCA Hb A1c Waived   Hyperlipidemia associated with type 2  diabetes mellitus (HCC)   Chronic, ongoing.  Continue current medication regimen and adjust as needed.  Lipid panel today.  Recommend he consistently take statin for most benefit.  Educated him on the importance of statin use in diabetes and risks if not consistently taken.      Relevant Medications   tirzepatide  (MOUNJARO ) 7.5 MG/0.5ML Pen   metFORMIN  (GLUCOPHAGE ) 500 MG tablet   Other Relevant Orders   Bayer DCA Hb A1c Waived   Comprehensive metabolic panel with GFR   Lipid Panel w/o Chol/HDL Ratio   Diabetes mellitus treated with injections of non-insulin  medication (HCC)   Refer to diabetes with obesity plan of care.      Relevant Medications   tirzepatide  (MOUNJARO ) 7.5 MG/0.5ML Pen   metFORMIN  (GLUCOPHAGE ) 500 MG tablet   Other Relevant Orders   Bayer DCA Hb A1c Waived     Other   Obesity   BMI 31.86.  Recommended eating smaller high protein, low fat meals more frequently and exercising 30 mins a day 5 times a week with a goal of 10-15lb weight loss in the next 3 months. Patient voiced their understanding and motivation to adhere to these recommendations.       Relevant Medications   tirzepatide  (MOUNJARO ) 7.5 MG/0.5ML Pen   metFORMIN  (GLUCOPHAGE ) 500 MG tablet   B12 deficiency   Noted on labs 04/03/20, not taking supplement.  Is on long term Metformin . Recheck today and initiate supplement as needed.      Relevant Orders   Vitamin B12   Other Visit Diagnoses       Benign prostatic hyperplasia without lower urinary tract symptoms       PSA check on labs today for annual check.   Relevant Orders   PSA        Follow up plan: Return in about 3 months (around 04/04/2024) for T2DM, HTN/HLD. "

## 2024-01-05 NOTE — Assessment & Plan Note (Signed)
 Chronic, ongoing.  Followed by GI in past. Continue Protonix  at 40 MG daily and obtain Mag level today.  Adjust regimen as needed.  Risks of PPI use were discussed with patient including bone loss, C. Diff diarrhea, pneumonia, infections, CKD, electrolyte abnormalities.  Verbalizes understanding and chooses to continue the medication.

## 2024-01-05 NOTE — Assessment & Plan Note (Signed)
 Chronic, ongoing.  A1c 6.8% today, trend down from 8.3% previous and tolerating change to Mounjaro .  Discussed with patient at length risks of high A1c and diabetes to include vision impairment, stroke, MI, loss of limbs, poor healing wounds, kidney disease. Reiterated the importance of maintaining regular follow-up and that diabetes is a team approach, with patient being leader. - Will trial increasing Mounjaro  to 7.5 MG weekly. Continue Metformin  500 MG TID and Jardiance  25 MG daily.  Is tolerating these and seeing blood sugars trend down at home with addition of Mounjaro .  He is aware that if these are too costly to alert provider and a referral to pharmacist can be placed.  Goal is to avoid insulin , which patient prefers to avoid as well. - He is to check sugars twice a day, discussed goals of fasting <130 and 2 hours after meals <180 - Foot and eye exams up to date. - Has had PPSV23, no flu shot, refuses. Would benefit PCV20 in future. - Taking ARB and Statin

## 2024-01-05 NOTE — Assessment & Plan Note (Signed)
Noted on labs 04/03/20, not taking supplement.  Is on long term Metformin. Recheck today and initiate supplement as needed.

## 2024-01-06 ENCOUNTER — Ambulatory Visit: Payer: Self-pay | Admitting: Nurse Practitioner

## 2024-01-06 LAB — CBC WITH DIFFERENTIAL/PLATELET
Basophils Absolute: 0.1 x10E3/uL (ref 0.0–0.2)
Basos: 1 %
EOS (ABSOLUTE): 0.1 x10E3/uL (ref 0.0–0.4)
Eos: 2 %
Hematocrit: 48.7 % (ref 37.5–51.0)
Hemoglobin: 17 g/dL (ref 13.0–17.7)
Immature Grans (Abs): 0 x10E3/uL (ref 0.0–0.1)
Immature Granulocytes: 0 %
Lymphocytes Absolute: 1.1 x10E3/uL (ref 0.7–3.1)
Lymphs: 19 %
MCH: 34.1 pg — ABNORMAL HIGH (ref 26.6–33.0)
MCHC: 34.9 g/dL (ref 31.5–35.7)
MCV: 98 fL — ABNORMAL HIGH (ref 79–97)
Monocytes Absolute: 0.6 x10E3/uL (ref 0.1–0.9)
Monocytes: 11 %
Neutrophils Absolute: 3.9 x10E3/uL (ref 1.4–7.0)
Neutrophils: 66 %
Platelets: 179 x10E3/uL (ref 150–450)
RBC: 4.99 x10E6/uL (ref 4.14–5.80)
RDW: 12.2 % (ref 11.6–15.4)
WBC: 5.8 x10E3/uL (ref 3.4–10.8)

## 2024-01-06 LAB — COMPREHENSIVE METABOLIC PANEL WITH GFR
ALT: 20 IU/L (ref 0–44)
AST: 17 IU/L (ref 0–40)
Albumin: 4.7 g/dL (ref 3.8–4.9)
Alkaline Phosphatase: 55 IU/L (ref 47–123)
BUN/Creatinine Ratio: 26 — ABNORMAL HIGH (ref 10–24)
BUN: 18 mg/dL (ref 8–27)
Bilirubin Total: 0.7 mg/dL (ref 0.0–1.2)
CO2: 19 mmol/L — ABNORMAL LOW (ref 20–29)
Calcium: 9 mg/dL (ref 8.6–10.2)
Chloride: 103 mmol/L (ref 96–106)
Creatinine, Ser: 0.7 mg/dL — ABNORMAL LOW (ref 0.76–1.27)
Globulin, Total: 2.2 g/dL (ref 1.5–4.5)
Glucose: 181 mg/dL — ABNORMAL HIGH (ref 70–99)
Potassium: 4.7 mmol/L (ref 3.5–5.2)
Sodium: 140 mmol/L (ref 134–144)
Total Protein: 6.9 g/dL (ref 6.0–8.5)
eGFR: 105 mL/min/1.73

## 2024-01-06 LAB — PSA: Prostate Specific Ag, Serum: 2 ng/mL (ref 0.0–4.0)

## 2024-01-06 LAB — LIPID PANEL W/O CHOL/HDL RATIO
Cholesterol, Total: 209 mg/dL — ABNORMAL HIGH (ref 100–199)
HDL: 42 mg/dL
LDL Chol Calc (NIH): 111 mg/dL — ABNORMAL HIGH (ref 0–99)
Triglycerides: 324 mg/dL — ABNORMAL HIGH (ref 0–149)
VLDL Cholesterol Cal: 56 mg/dL — ABNORMAL HIGH (ref 5–40)

## 2024-01-06 LAB — MAGNESIUM: Magnesium: 2.3 mg/dL (ref 1.6–2.3)

## 2024-01-06 LAB — TSH: TSH: 0.807 u[IU]/mL (ref 0.450–4.500)

## 2024-01-06 LAB — VITAMIN B12: Vitamin B-12: 210 pg/mL — ABNORMAL LOW (ref 232–1245)

## 2024-01-06 NOTE — Progress Notes (Signed)
 Contacted via MyChart  Good evening Matthew Collier, your labs have returned: - CBC overall stable, but B12 is low. Please take Vitamin B12 supplement 1000 MCg daily for nervous system health. Metformin  can lower B12 levels. - Kidney function, creatinine and eGFR, remains stable, as is liver function, AST and ALT.  - Lipid panel is showing trends up, are you taking Simvastatin  daily. Let me know because if so we may need to change to an alternate statin for better control. - Remainder of labs stable. Any questions? Keep being amazing!!  Thank you for allowing me to participate in your care.  I appreciate you. Kindest regards, Kennieth Plotts

## 2024-04-09 ENCOUNTER — Ambulatory Visit: Admitting: Nurse Practitioner
